# Patient Record
Sex: Male | Born: 1955 | Race: White | Hispanic: No | Marital: Married | Smoking: Former smoker
Health system: Southern US, Community
[De-identification: ages and names within clinical notes are randomized; demographics above are authoritative.]

## PROBLEM LIST (undated history)

## (undated) DIAGNOSIS — R42 Dizziness and giddiness: Secondary | ICD-10-CM

## (undated) DIAGNOSIS — J45909 Unspecified asthma, uncomplicated: Secondary | ICD-10-CM

## (undated) DIAGNOSIS — E785 Hyperlipidemia, unspecified: Secondary | ICD-10-CM

## (undated) DIAGNOSIS — I10 Essential (primary) hypertension: Secondary | ICD-10-CM

## (undated) DIAGNOSIS — E7849 Other hyperlipidemia: Secondary | ICD-10-CM

## (undated) DIAGNOSIS — Z973 Presence of spectacles and contact lenses: Secondary | ICD-10-CM

## (undated) DIAGNOSIS — M1990 Unspecified osteoarthritis, unspecified site: Secondary | ICD-10-CM

## (undated) DIAGNOSIS — K219 Gastro-esophageal reflux disease without esophagitis: Secondary | ICD-10-CM

## (undated) DIAGNOSIS — E663 Overweight: Secondary | ICD-10-CM

## (undated) DIAGNOSIS — Z905 Acquired absence of kidney: Secondary | ICD-10-CM

## (undated) HISTORY — DX: Other hyperlipidemia: E78.49

## (undated) HISTORY — PX: OTHER SURGICAL HISTORY: SHX169

## (undated) HISTORY — PX: KIDNEY DONATION: SHX685

## (undated) HISTORY — PX: ELBOW SURGERY: SHX618

## (undated) HISTORY — DX: Acquired absence of kidney: Z90.5

## (undated) HISTORY — DX: Gastro-esophageal reflux disease without esophagitis: K21.9

## (undated) HISTORY — DX: Overweight: E66.3

## (undated) HISTORY — DX: Essential (primary) hypertension: I10

## (undated) HISTORY — PX: NEPHRECTOMY: SHX65

## (undated) HISTORY — PX: HIP ARTHROPLASTY: SHX981

## (undated) HISTORY — DX: Dizziness and giddiness: R42

## (undated) HISTORY — PX: CYST EXCISION: SHX5701

## (undated) HISTORY — DX: Hyperlipidemia, unspecified: E78.5

---

## 2002-04-03 ENCOUNTER — Encounter: Payer: Self-pay | Admitting: Cardiology

## 2002-04-03 ENCOUNTER — Observation Stay (HOSPITAL_COMMUNITY): Admission: EM | Admit: 2002-04-03 | Discharge: 2002-04-04 | Payer: Self-pay | Admitting: Cardiology

## 2002-04-04 ENCOUNTER — Encounter: Payer: Self-pay | Admitting: Cardiology

## 2002-04-04 HISTORY — PX: CARDIAC CATHETERIZATION: SHX172

## 2005-01-06 ENCOUNTER — Encounter (INDEPENDENT_AMBULATORY_CARE_PROVIDER_SITE_OTHER): Payer: Self-pay | Admitting: Specialist

## 2005-01-06 ENCOUNTER — Ambulatory Visit (HOSPITAL_COMMUNITY): Admission: RE | Admit: 2005-01-06 | Discharge: 2005-01-06 | Payer: Self-pay | Admitting: Neurosurgery

## 2006-05-31 ENCOUNTER — Ambulatory Visit: Payer: Self-pay | Admitting: Cardiology

## 2006-10-26 ENCOUNTER — Ambulatory Visit: Payer: Self-pay | Admitting: Gastroenterology

## 2007-04-07 ENCOUNTER — Ambulatory Visit (HOSPITAL_COMMUNITY): Admission: RE | Admit: 2007-04-07 | Discharge: 2007-04-07 | Payer: Self-pay | Admitting: Family Medicine

## 2008-01-18 ENCOUNTER — Ambulatory Visit: Payer: Self-pay | Admitting: Cardiology

## 2008-02-01 ENCOUNTER — Ambulatory Visit: Payer: Self-pay | Admitting: Cardiology

## 2008-02-01 ENCOUNTER — Ambulatory Visit: Payer: Self-pay

## 2009-08-23 ENCOUNTER — Encounter: Payer: Self-pay | Admitting: Cardiology

## 2009-08-25 ENCOUNTER — Ambulatory Visit: Payer: Self-pay | Admitting: Cardiology

## 2009-08-25 ENCOUNTER — Encounter: Payer: Self-pay | Admitting: Cardiology

## 2009-09-01 ENCOUNTER — Ambulatory Visit: Payer: Self-pay | Admitting: Cardiology

## 2009-09-08 ENCOUNTER — Encounter: Payer: Self-pay | Admitting: Cardiology

## 2009-09-08 DIAGNOSIS — E785 Hyperlipidemia, unspecified: Secondary | ICD-10-CM | POA: Insufficient documentation

## 2009-09-08 DIAGNOSIS — E663 Overweight: Secondary | ICD-10-CM | POA: Insufficient documentation

## 2009-09-08 DIAGNOSIS — I10 Essential (primary) hypertension: Secondary | ICD-10-CM | POA: Insufficient documentation

## 2009-09-08 DIAGNOSIS — E669 Obesity, unspecified: Secondary | ICD-10-CM | POA: Insufficient documentation

## 2009-09-08 DIAGNOSIS — R42 Dizziness and giddiness: Secondary | ICD-10-CM | POA: Insufficient documentation

## 2009-09-10 ENCOUNTER — Ambulatory Visit: Payer: Self-pay | Admitting: Cardiology

## 2009-09-24 ENCOUNTER — Encounter: Payer: Self-pay | Admitting: Cardiology

## 2009-09-26 ENCOUNTER — Encounter (INDEPENDENT_AMBULATORY_CARE_PROVIDER_SITE_OTHER): Payer: Self-pay | Admitting: *Deleted

## 2009-10-08 ENCOUNTER — Ambulatory Visit: Admission: RE | Admit: 2009-10-08 | Discharge: 2009-10-08 | Payer: Self-pay | Admitting: Family Medicine

## 2010-04-12 ENCOUNTER — Encounter: Payer: Self-pay | Admitting: Family Medicine

## 2010-04-21 NOTE — Miscellaneous (Signed)
  Clinical Lists Changes  Problems: Added new problem of HYPERTENSION (ICD-401.9) Added new problem of DYSLIPIDEMIA (ICD-272.4) Added new problem of OVERWEIGHT (ICD-278.02) Added new problem of * NEPHRECTOMY Added new problem of DIZZINESS (ICD-780.4) Added new problem of * Rule out  R/O OSA Observations: Added new observation of PAST MED HX: Normal coronaries....cath...2004  /   nuclear...normal...2008 Hypertension Dyslipidemia GERD Overweight 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 (09/08/2009 17:21)       Past History:  Past Medical History: Normal coronaries....cath...2004  /   nuclear...normal...2008 Hypertension Dyslipidemia GERD Overweight 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

## 2010-04-21 NOTE — Letter (Signed)
Summary: Engineer, materials at [facility]  [address]   [place], [place] [address]   Phone: [phone]  Fax: [phone]        September 26, 2009 MRN: [id number]    Blake Solis [address] [place], [place]  [address]    Dear [name],  Your test ordered by Selena Batten has been reviewed by your physician (or physician assistant) and was found to be normal or stable. Your physician (or physician assistant) felt no changes were needed at this time.  ____ Echocardiogram  ____ Cardiac Stress Test  ____ Lab Work  ____ Peripheral vascular study of arms, legs or neck  ____ CT scan or X-ray  ____ Lung or Breathing test  __X__ Other: Heart Monitor   Thank you.   Cyril Loosen, RN, BSN    Duane Boston, M.D., F.A.C.C. Thressa Sheller, M.D., F.A.C.C. Oneal Grout, M.D., F.A.C.C. Cheree Ditto, M.D., F.A.C.C. Daiva Nakayama, M.D., F.A.C.C. Kenney Houseman, M.D., F.A.C.C. Jeanne Ivan, PA-C

## 2010-04-21 NOTE — Consult Note (Signed)
Summary: Consultation Report/ CARDIOLOGY  Consultation Report/ CARDIOLOGY   Imported By: Dorise Hiss 08/29/2009 12:25:38  _____________________________________________________________________  External Attachment:    Type:   Image     Comment:   External Document

## 2010-04-21 NOTE — Assessment & Plan Note (Signed)
Summary: EPH- POST [facility] 6/10   Visit Type:  hospital follow-up Primary Provider:  Elon Alas  CC:  dizziness.  History of Present Illness: The patient is seen post hospitalization.  He admitted with marked dizziness.  He did not have syncope or presyncope.  He was followed in the hospital and there was no evidence of myocardial infarction.  Two-dimensional echo revealed normal left ventricular function.  It was felt that he might have sleep apnea.  It was also felt that he might have symptomatic bradycardia.  He was discharged and he has been wearing an event recorder.  Unfortunately the recorder leads, off at work and he pulls them off by mistake at night.  We do have some useful information.  He's not had any documented marked bradycardia.  He had one episode of mild lightheadedness and he had sinus rhythm at that time.  He is fully active.  Preventive Screening-Counseling & Management  Alcohol-Tobacco     Smoking Status: quit     Year Quit: 2000  Current Medications (verified): 1)  Lisinopril-Hydrochlorothiazide 20-25 Mg Tabs (Lisinopril-Hydrochlorothiazide) .... Take 1/2 Tablet By Mouth Once A Day 2)  Aspir-Low 81 Mg Tbec (Aspirin) .... Take 1 Tablet By Mouth Once A Day  Allergies (verified): No Known Drug Allergies  Comments:  Nurse/Medical Assistant: The patient's medications and allergies were verbally reviewed with the patient and were updated in the Medication and Allergy Lists.  Past History:  Past Medical History: Normal coronaries....cath...2004  /   nuclear...normal...2008 Hypertension Dyslipidemia GERD Overweight Nephrectomy  left....donated to his brother who lives without dialysis for 6 more years and died of diabetes complications EF  55-60%...echo...08/25/2009 Dizziness  MMH..08/23/2009.....consider chronotropic incompetence / outpatient event recorder OSA   needs to be ruled out 08/2009  Social History: Smoking Status:  quit  Review of Systems    Patient denies fever, chills, headache, sweats, rash, change in vision, change in hearing, chest pain, cough, nausea vomiting, urinary symptoms.  All of the systems are reviewed and are negative.  Vital Signs:  Patient profile:   55 year old male Height:      70 inches Weight:      248 pounds BMI:     35.71 Pulse rate:   54 / minute BP sitting:   102 / 70  (left arm) Cuff size:   large  Vitals Entered By: Carlye Grippe (September 10, 2009 1:36 PM)  Nutrition Counseling: Patient's BMI is greater than 25 and therefore counseled on weight management options.  Physical Exam  General:  Patient looks good but is overweight. Head:  head is atraumatic. Eyes:  no xanthelasma. Neck:  no jugular venous stenting.  No carotid bruits. Chest Wall:  no chest wall tenderness. Lungs:  lungs are clear.  Respiratory effort is nonlabored. Heart:  cardiac exam shows S1 and S2.  No clicks or significant murmurs. Abdomen:  abdomen is soft. Msk:  no musculoskeletal deformities. Extremities:  no peripheral edema. Skin:  no skin rashes. Psych:  patient is oriented to person time and place.  Affect is normal.   Impression & Recommendations:  Problem # 1:  Rule out OSA Copy of this note will go to the patient's primary physician.  He will follow up with her about studies needed to rule out obstructive sleep apnea.  I think it would be a good idea to do this.  Problem # 2:  DIZZINESS (ICD-780.4) The patient's dizziness is significantly improved.  He has stopped taking her medication for arthritis  and had been started prior to hospitalization.  He is remaining hydrated.  I have reviewed the strips are available so far from his event recorder.  He's had no significant bradycardia.  At some point I may consider exercise testing to be sure that he does not have chronotropic incompetence.  At this point he is stable and no further workup until the next visit.  Problem # 3:  HYPERTENSION (ICD-401.9)  His updated  medication list for this problem includes:    Lisinopril-hydrochlorothiazide 20-25 Mg Tabs (Lisinopril-hydrochlorothiazide) .Marland Kitchen... Take 1/2 tablet by mouth once a day    Aspir-low 81 Mg Tbec (Aspirin) .Marland Kitchen... Take 1 tablet by mouth once a day Blood pressures controlled today.  No change in therapy  Problem # 4:  OVERWEIGHT (ICD-278.02) I have encouraged him to lose weight.  Patient Instructions: 1)  Your physician wants you to follow-up in: 3 months. You will receive a reminder letter in the mail one-two months in advance. If you don't receive a letter, please call our office to schedule the follow-up appointment. 2)  Your physician recommends that you continue on your current medications as directed. Please refer to the Current Medication list given to you today. 3)  Continue wearing cardiac monitor.

## 2010-04-21 NOTE — Procedures (Signed)
Summary: Holter and Event/ CARDIONET  Holter and Event/ CARDIONET   Imported By: Dorise Hiss 09/25/2009 14:22:25  _____________________________________________________________________  External Attachment:    Type:   Image     Comment:   External Document  Appended Document: Holter and Event/ CARDIONET Pt notified of results by letter.

## 2010-04-21 NOTE — Cardiovascular Report (Signed)
Summary: Cardiac Catheterization  Cardiac Catheterization   Imported By: Dorise Hiss 09/10/2009 09:18:23  _____________________________________________________________________  External Attachment:    Type:   Image     Comment:   External Document

## 2010-06-19 ENCOUNTER — Encounter: Payer: Self-pay | Admitting: Cardiology

## 2010-06-20 ENCOUNTER — Encounter: Payer: Self-pay | Admitting: Cardiology

## 2010-07-03 LAB — HM COLONOSCOPY

## 2010-07-08 ENCOUNTER — Encounter: Payer: Self-pay | Admitting: Nurse Practitioner

## 2010-07-08 DIAGNOSIS — N433 Hydrocele, unspecified: Secondary | ICD-10-CM | POA: Insufficient documentation

## 2010-07-08 DIAGNOSIS — N419 Inflammatory disease of prostate, unspecified: Secondary | ICD-10-CM | POA: Insufficient documentation

## 2010-07-08 DIAGNOSIS — IMO0002 Reserved for concepts with insufficient information to code with codable children: Secondary | ICD-10-CM | POA: Insufficient documentation

## 2010-07-08 DIAGNOSIS — I10 Essential (primary) hypertension: Secondary | ICD-10-CM | POA: Insufficient documentation

## 2010-07-08 DIAGNOSIS — E7849 Other hyperlipidemia: Secondary | ICD-10-CM | POA: Insufficient documentation

## 2010-07-08 DIAGNOSIS — E785 Hyperlipidemia, unspecified: Secondary | ICD-10-CM

## 2010-07-30 ENCOUNTER — Ambulatory Visit: Payer: Self-pay | Admitting: Cardiology

## 2010-08-04 NOTE — Assessment & Plan Note (Signed)
[facility]                          [place] [facility] OFF[facility] NOTE   NAME:Blake Solis, Blake Solis                     MRN:          [id number]  DATE:01/18/2008                            DOB:          1956-03-15    Blake Solis has had chest pain and shortness of breath.  Dr. Raquel James  had called me and we were arranging a visit with Blake Solis.  He  changed the timing and is now here for that evaluation.  He is  overweight.  He has been relatively active.  He does have a family  history of coronary disease.  Recently, he noticed exertional shortness  of breath.  He also has his right upper chest discomfort with exertion.  He has had this over time.  Most recently, it appears to be worse.  He  has not had any rest pain.  The pain occurs in his right upper chest.  There is no significant radiation.  There is shortness of breath.  There  is no nausea, vomiting, or diaphoresis.  The patient does have a  significant family history of coronary disease with brothers having had  MIs.  I have reviewed his data extensively including finding his cath  report from 2004.  At that time, he had high ejection fraction, and his  coronaries were normal.  Also, we have found through the [facility] records that the patient had an adenosine Cardiolite scan done  in March 2008.  That study showed a normal ejection fraction and no  ischemia.   ALLERGIES:  No known drug allergies.   MEDICATIONS:  He is on no medications.   OTHER MEDICAL PROBLEMS:  See the list below.   SOCIAL HISTORY:  The patient had work at [employer], but was laid  off in May.  He is actively looking for work.  He is married.  He quit  smoking approximately 6 years ago.   FAMILY HISTORY:  There is a definite family history of coronary disease.   REVIEW OF SYSTEMS:  He is not having any fevers or chills.  He is not  having any skin problems.  He has no headaches.  He has mild GERD by  history.   This is not an active problem at this time.  He does have a  problem with some constipation.  Otherwise, review of systems is  negative.   PHYSICAL EXAMINATION:  Blood pressure when taken initially was 190/102.  I repeated his blood pressure in his right arm with a large cuff getting  160/98.  His weight is 278 pounds.  Pulse is 68.  The patient is  oriented to person, time, and place.  Affect is normal.  HEENT reveals  no xanthelasma.  He has normal extraocular motion.  There are no carotid  bruits.  There is no jugular venous distention.  Lungs are clear.  Respiratory effort is not labored.  Cardiac exam reveals an S1 with an  S2.  There are no clicks or significant murmurs.  His abdomen is obese,  but soft.  He has no significant peripheral edema.  He  has 2 dark spots  on his lower right arm.  This was an attempt by him many years ago to do  his own tattoo.   EKG was sent to [place] dated November 16, 2007.  It revealed nonspecific ST-T  wave changes.   Problems include:  1. 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  This was done in 2004.  The kidney was accepted and his      brother-in-law is off dialysis and doing well.  2. 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  3. History of gastroesophageal reflux disease that is mild and not an      active problem.  4. History of some leg weakness in the past that was thought to be      related to disk disease.  5. Chest x-ray in August 2009 revealing some mild increased bronchial      mark.  6. Obesity.  7. History of smoking that he quit approximately 6 years ago.  8. Definite family history of coronary artery disease.  9. Possible hyperlipidemia that needs to be followed up.  10.Elevated blood pressure today.  He has not had elevated blood      pressure in the past.  I am not starting meds today, but this will      be followed carefully.  11.EKG with nonspecific ST-T wave changes.   12.Exertional shortness of breath and chest pain.  His current      symptoms are concerning.  However, we know that he had a very      normal catheterization in 2004 and he had a normal adenosine      Cardiolite in March 2008.  Therefore, I feel that we can reassess      him with a Cardiolite at this time.  At one time in the past also,      he had a high ejection fraction.  I do not hear a murmur today, but      we will obtain a 2-D echo to be sure that he does not have a      hypertrophic myopathy.  We will also assess his diastolic function.      I will not be starting meds other than suggesting that he take an      aspirin.   The patient actually lives [place].  We will arrange for his test to be  done at [facility], and I will see him in the [place] office for  followup.     Blake Abed, MD, [facility]  Electronically Signed    JDK/MedQ  DD: 01/18/2008  DT: 01/19/2008  Job #: [id number]   cc:   Blake Beath, MD

## 2010-08-04 NOTE — Assessment & Plan Note (Signed)
[facility]                            CARDIOLOGY OFFICE NOTE   NAME:Blake Solis, Blake Solis                     MRN:          [id number]  DATE:02/01/2008                            DOB:          1955/05/02    I saw Mr. Ricciuti on January 18, 2008.  At that time, we considered a  nuclear exercise test and possibly a follow up echo.  The patient has no  insurance and he was very hesitant to undergo testing that would have  any significant cost.  I re-reviewed the entire situation and decided  that we could proceed with a standard treadmill and then make decisions  going further.   The patient had treadmill this morning.  See the report.  He exercised  only 3-1/2 minutes.  He had shortness of breath.  There might have been  slight chest pain.  There was no ectopy.  There was no significant EKG  change.  Study was nondiagnostic because of inadequate heart rate.   I have discussed the findings with the patient.  It is of note that he  is now on lisinopril/hydrochlorothiazide.  He took it this morning.  His  resting blood pressure was relatively good, but he had a fairly rapid  increase in blood pressure with exercise.   Problems are listed on my note of January 18, 2008.  #10.  Elevated blood pressure.  This is now being treated by Dr.  Raquel James.  I have encouraged the patient to be in touch and to plan to  see Dr. Raquel James for followup sometime next week.  #12.  Exertional shortness of breath and chest pain.  The patient has a  hypertensive response to stress.  He has limited exercise tolerance.  He  has no significant EKG change with the symptoms.  At this point, I am  not convinced that this represents ischemia.  Further evaluation could  include a cardiopulmonary exercise test.  This would give [place] a great  deal of information concerning his limitations.  However, at this point  considering all issues, we will follow him clinically.  He will see Dr.  Raquel James for followup for continued management of his blood pressure.  I  will plan to see him in 3 months, see in follow up to reassess all of  his issues.     Luis Abed, MD, [facility]  Electronically Signed    JDK/MedQ  DD: 02/01/2008  DT: 02/01/2008  Job #: [id number]   cc:   Ursula Beath, MD

## 2010-08-04 NOTE — Procedures (Signed)
[facility]                              EXERCISE TREADMILL   NAME:Lafferty, Blake Solis                     MRN:          [id number]  DATE:02/01/2008                            DOB:          02-15-1956    [name] has had chest pain and shortness of breath and this study is  done for further evaluation.   Resting blood pressure was 148/79.  The patient began to walk, and he  completed 30 seconds into stage II of the Standard Bruce.  Heart rate  increased to 117, representing 69% predicted maximum heart rate.  He  stopped due to overall fatigue and shortness of breath.  He had slight  chest discomfort.  Blood pressure went up rapidly to 206/89.   EKG revealed no significant abnormalities.  There was no ectopy.  There  was no significant ST change.   IMPRESSION:  1. Nondiagnostic exercise tolerance test due to inadequate heart rate.      No definite ischemia is seen.  2. Limited exercise tolerance.  3. Question of chronotropic incompetence, but he really did have an      increase in heart rate from 75 up to 117.  I doubt that this is the      primary issue.     Luis Abed, MD, [facility]  Electronically Signed    JDK/MedQ  DD: 02/01/2008  DT: 02/01/2008  Job #: [id number]

## 2010-08-07 NOTE — Cardiovascular Report (Signed)
   Blake Solis, Blake Solis NO.:  [id number]   MEDICAL RECORD NO.:  [id number]                   PATIENT TYPE:  INP   LOCATION:  3737                                 FACILITY:  [facility]   PHYSICIAN:  Salvadore Farber, M.D. [facility]         DATE OF BIRTH:  May 17, 1955   DATE OF PROCEDURE:  04/04/2002  DATE OF DISCHARGE:  04/04/2002                              CARDIAC CATHETERIZATION   PROCEDURES:  Left heart catheterization, left ventriculography, coronary  angiography.   INDICATIONS:  The patient is a 55 year old gentleman with no known history  of coronary artery disease, but risk factors of obesity, remote tobacco use,  family history of premature coronary artery disease.  He now presents with  recurrent chest discomfort.  Given his risk factors he is referred directly  for diagnostic angiography.   DIAGNOSTIC TECHNIQUE:  Informed consent was obtained. Under 1% lidocaine  local anesthesia, a 6 French sheath was placed in the right femoral artery  using the modified Seldinger technique. Diagnostic angiography and  ventriculography were performed using JL4, JR4, and pigtail catheters.  The  patient tolerated the procedure well and was transferred to the holding room  in stable condition.   COMPLICATIONS:  None.   FINDINGS:  1. LV:  131/10/23, EF 80% without regional wall motion abnormality.  2. Left main:  Angiographically normal.  3. LAD:  The LAD is a large vessel giving rise to two diagonal branches.  It     is angiographically normal.  4. Circumflex:  The circumflex is a moderate sized vessel giving rise to two     obtuse marginal branches.  It is angiographically normal.  5. RCA:  The RCA is a moderate sized but dominant vessel which is     angiographically normal.    IMPRESSION:  1. Normal left ventricular size and systolic function.  2. No aortic stenosis or mitral regurgitation.  3. Angiographically normal coronary  arteries.                                                      Salvadore Farber, M.D. [facility]    WED/MEDQ  D:  05/31/2002  T:  05/31/2002  Job:  [id number]   cc:   Fara Chute  [address] [place]  [place] [address]  Fax: [phone]

## 2010-08-07 NOTE — Op Note (Signed)
Blake Solis, Blake Solis              ACCOUNT NO.:  [id number]   MEDICAL RECORD NO.:  [id number]          PATIENT TYPE:  AMB   LOCATION:  SDS                          FACILITY:  [facility]   PHYSICIAN:  Coletta Memos, M.D.     DATE OF BIRTH:  03-Feb-1956   DATE OF PROCEDURE:  01/06/2005  DATE OF DISCHARGE:                                 OPERATIVE REPORT   PREOPERATIVE DIAGNOSIS:  Left ulnar nerve neuropathy.   POSTOPERATIVE DIAGNOSIS:  Left ulnar nerve neuropathy.   OPERATION PERFORMED:  Left ulnar nerve decompression via removal of ganglion  cyst at the medial epicondyle and olecranon.   SURGEON:  Coletta Memos, M.D.   ANESTHESIA:  General.   COMPLICATIONS:  None.   SPECIMENS:  Cyst wall.   FINDINGS:  Clear, golden, mildly thick fluid emanated from cyst.   INDICATIONS FOR PROCEDURE:  Blake Solis is a gentleman who presented with  pain in his left upper extremity from his elbow down.  He had a positive  Tinel's sign.  He also had weakness in his grip, intrinsics and abductor  digiti minimi.  It was my belief that he had an ulnar neuropathy and on EMG,  this was shown.  I therefore recommended and he agreed to undergo operative  decompression.   DESCRIPTION OF PROCEDURE:  His left upper extremity was prepped from his  fingers to the elbow.  He had a stockinette placed and an extremity drape.  I opened the extremity drape and then infiltrated 10 mL 0.5% lidocaine with  epinephrine 1:200,000 in a semicircle incision with its apex based over the  medial epicondyle.  I then dissected down through the soft tissue until I  was able to find the olecranon and the medial epicondyle.  At that point I  dissected but was not able to find the nerve.  There was a large muscle  which actually was right in the groove between the medial epicondyle in the  ulnar groove.  I dissected just between that and some soft tissue and then  encountered what was a large translucent bubble.  Not sure what it  was, I  dissected further and it was clear at this point that this was a large  cystic structure which was causing significant compression of the nerve root  against the medial epicondyle.  I used the nerve stimulator and used two but  was never able to stimulate the ulnar nerve during the case.  Nevertheless I  was able to dissect the ulnar nerve out both proximally and distally and  there was no pressure there and it was clear quite frankly that the pressure  had been coming from the cyst.  I then broke the cyst open.  Clear, golden,  mildly thick fluid then drained out of it and I further dissected the cyst  wall until I was able to  remove it very close to the joint itself.  The nerve was free of any  pressure at that point. I then irrigated the wound. I tried to stimulate  once more and again was unable to cause any movement in  the ulnar enervated  musculature.  I then closed the wound in layered fashion using Vicryl  sutures.  Dermabond was used for sterile dressing.           ______________________________  Coletta Memos, M.D.     KC/MEDQ  D:  01/06/2005  T:  01/07/2005  Job:  [id number]

## 2010-08-07 NOTE — Discharge Summary (Signed)
Blake Solis, Blake Solis                          ACCOUNT NO.:  [id number]   MEDICAL RECORD NO.:  [id number]                   PATIENT TYPE:  INP   LOCATION:  3737                                 FACILITY:  [facility]   PHYSICIAN:  Learta Codding, M.D. [facility]             DATE OF BIRTH:  12-06-1955   DATE OF ADMISSION:  04/03/2002  DATE OF DISCHARGE:  04/04/2002                           DISCHARGE SUMMARY - REFERRING   PROCEDURES:  1. Cardiac catheterization.  2. Coronary arteriogram.  3. Left ventriculogram.   HISTORY OF PRESENT ILLNESS:  The patient is a 55 year old male with no known  history of coronary artery disease.  He had chest discomfort in 2002 for  which he underwent exercise Cardiolite which showed no abnormalities with a  normal EF.  He had chest pain for which he was evaluated in [place] and seen by  cardiology there.  It was felt that, with his risk factors including  obesity, remote history of tobacco use, family history of coronary artery  disease, he needed further evaluation including cardiac catheterization.  He  was transferred to [facility] and scheduled for this on 04/04/2002.   HOSPITAL COURSE:  The patient had a cardiac catheterization on 04/04/2002  which showed normal coronary arteries and an EF of 80% with no regional wall  motion abnormalities.  It was felt that his symptoms were not secondary to  coronary artery disease, and further outpatient workup was indicated.  His  discharge is pending ambulation after bed restis completed.  He also had  borderline hypertension and was started on low-dose beta blocker for this.  He is also advised to keep taking a baby aspirin daily.  He had a remote  history of gastroesophageal reflux disease but has not had symptoms in  several months and is to follow up with his primary care physician for this  and for hyperlipidemia.   If his groin is stable with ambulation, he will be discharged on 04/04/2002  p.m.   LABORATORY  DATA:  Hemoglobin 15.3, hematocrit 44.4, WBC 7.1, platelets 189.  Sodium 137, potassium 3.8, chloride 104, CO2 26, BUN 10, creatinine 0.9,  glucose 111.   Chest x-ray: Cardiomegaly with no active lung disease.   CONDITION ON DISCHARGE:  Stable.   DISCHARGE DIAGNOSES:  1. Chest pain, no cardiac source by catheterization.  2. Remote history of tobacco use.  3. Family history of premature coronary artery disease.  4. Hyperlipidemia with an LDL of 130 at last check.  5. Mild obesity.   DISCHARGE INSTRUCTIONS:  1. Activity level is to include no driving, sexual or strenuous activity for     two days.  He can return to work Monday.  2. He is to stick to a low-fat diet.  3. He is to call the office for problems with the catheterization site.  4. He is to follow up with Dr. Neita Carp within two weeks.  5. He is to follow up with Dr. Andee Lineman on a p.r.n. basis.   DISCHARGE MEDICATIONS:  1. Toprol XL 25 mg daily.  2. Aspirin 81 mg daily.     Lavella Hammock, P.A. [facility]                  Learta Codding, M.D. [facility]    RG/MEDQ  D:  04/04/2002  T:  04/04/2002  Job:  [id number]   cc:   Fara Chute, M.D.   [facility] [place], [place]

## 2010-08-13 ENCOUNTER — Ambulatory Visit: Payer: Self-pay | Admitting: Cardiology

## 2011-03-23 HISTORY — PX: KNEE ARTHROSCOPY: SUR90

## 2011-10-07 ENCOUNTER — Encounter: Payer: Self-pay | Admitting: *Deleted

## 2012-05-29 ENCOUNTER — Other Ambulatory Visit (HOSPITAL_COMMUNITY): Payer: Self-pay | Admitting: Orthopaedic Surgery

## 2012-05-30 ENCOUNTER — Encounter (HOSPITAL_COMMUNITY): Payer: Self-pay | Admitting: Pharmacy Technician

## 2012-06-02 ENCOUNTER — Encounter (HOSPITAL_COMMUNITY)
Admission: RE | Admit: 2012-06-02 | Discharge: 2012-06-02 | Disposition: A | Discharge status: Home / Self Care | Payer: BC Managed Care – PPO | Source: Ambulatory Visit | Attending: Orthopaedic Surgery | Admitting: Orthopaedic Surgery

## 2012-06-02 ENCOUNTER — Encounter (HOSPITAL_COMMUNITY): Payer: Self-pay

## 2012-06-02 DIAGNOSIS — Z01818 Encounter for other preprocedural examination: Secondary | ICD-10-CM | POA: Insufficient documentation

## 2012-06-02 DIAGNOSIS — Z01812 Encounter for preprocedural laboratory examination: Secondary | ICD-10-CM | POA: Insufficient documentation

## 2012-06-02 DIAGNOSIS — M1610 Unilateral primary osteoarthritis, unspecified hip: Secondary | ICD-10-CM | POA: Insufficient documentation

## 2012-06-02 HISTORY — DX: Unspecified asthma, uncomplicated: J45.909

## 2012-06-02 HISTORY — DX: Unspecified osteoarthritis, unspecified site: M19.90

## 2012-06-02 LAB — COMPREHENSIVE METABOLIC PANEL
Albumin: 4 g/dL (ref 3.5–5.2)
BUN: 12 mg/dL (ref 6–23)
Calcium: 9.5 mg/dL (ref 8.4–10.5)
Creatinine, Ser: 1.04 mg/dL (ref 0.50–1.35)
GFR calc Af Amer: >90 mL/min (ref >90)
Glucose, Bld: 98 mg/dL (ref 70–99)
Potassium: 3.9 mEq/L (ref 3.5–5.1)
Total Protein: 7.4 g/dL (ref 6.0–8.3)

## 2012-06-02 LAB — CBC
HCT: 41 % (ref 39.0–52.0)
Hemoglobin: 14.5 g/dL (ref 13.0–17.0)
MCH: 31.2 pg (ref 26.0–34.0)
MCHC: 35.4 g/dL (ref 30.0–36.0)
MCV: 88.2 fL (ref 78.0–100.0)
RDW: 12.7 % (ref 11.5–15.5)

## 2012-06-02 LAB — APTT: aPTT: 37 seconds (ref 24–37)

## 2012-06-02 LAB — TYPE AND SCREEN
ABO/RH(D): O POS
Antibody Screen: NEGATIVE

## 2012-06-02 LAB — PROTIME-INR
INR: 1.01 (ref 0.00–1.49)
Prothrombin Time: 13.2 seconds (ref 11.6–15.2)

## 2012-06-02 LAB — URINALYSIS, ROUTINE W REFLEX MICROSCOPIC
Hgb urine dipstick: NEGATIVE
Nitrite: NEGATIVE
Protein, ur: NEGATIVE mg/dL
Specific Gravity, Urine: 1.015 (ref 1.005–1.030)
Urobilinogen, UA: 1 mg/dL (ref 0.0–1.0)

## 2012-06-02 NOTE — Pre-Procedure Instructions (Signed)
GAHEL SAFLEY  06/02/2012   Your procedure is scheduled on:  Wednesday June 07, 2012  Report to [facility] at 1045 AM.  Call this number if you have problems the morning of surgery: [phone]   Remember:   Do not eat food or drink liquids after midnight.   Take these medicines the morning of surgery with A SIP OF WATER: Norvasc(Amlodipine), Hydrocodone-Acetaminophen(Vicodin) or Tramadol if needed for pain   Do not wear jewelry.  Do not wear lotions, or powders. You may wear deodorant.  Men may shave face and neck.  Do not bring valuables to the hospital.  Contacts, dentures or bridgework may not be worn into surgery.  Leave suitcase in the car. After surgery it may be brought to your room.  For patients admitted to the hospital, checkout time is 11:00 AM the day of  discharge.   Patients discharged the day of surgery will not be allowed to drive  home.    Special Instructions: Incentive Spirometry - Practice and bring it with you on the day of surgery. Shower using CHG 2 nights before surgery and the night before surgery.  If you shower the day of surgery use CHG.  Use special wash - you have one bottle of CHG for all showers.  You should use approximately 1/3 of the bottle for each shower.   Please read over the following fact sheets that you were given: Pain Booklet, Coughing and Deep Breathing, Blood Transfusion Information, Total Joint Packet, MRSA Information and Surgical Site Infection Prevention

## 2012-06-02 NOTE — Progress Notes (Signed)
Dr. Ophelia Charter office made aware that patient missed his PAT appointment.

## 2012-06-05 NOTE — H&P (Signed)
TOTAL HIP ADMISSION H&P  Patient is admitted for right total hip arthroplasty.  Subjective:  Chief Complaint: right hip pain  HPI: Blake Solis, 57 y.o. male, has a history of pain and functional disability in the right hip(s) due to arthritis and patient has failed non-surgical conservative treatments for greater than 12 weeks to include NSAID's and/or analgesics, use of assistive devices and activity modification.  Onset of symptoms was gradual starting 1 years ago with gradually worsening course since that time.The patient noted no past surgery on the right hip(s).  Patient currently rates pain in the right hip at 8 out of 10 with activity. Patient has night pain, worsening of pain with activity and weight bearing, trendelenberg gait and pain that interfers with activities of daily living. Patient has evidence of subchondral sclerosis, joint space narrowing and flattening of the femoral head and erosion of the acetabulum by imaging studies. This condition presents safety issues increasing the risk of falls. 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  There is no current active infection.  Patient Active Problem List   Diagnosis Date Noted  . DYSLIPIDEMIA 09/08/2009  . OVERWEIGHT 09/08/2009  . HYPERTENSION 09/08/2009  . DIZZINESS 09/08/2009   Past Medical History  Diagnosis Date  . HTN (hypertension)   . Dyslipidemia   . GERD (gastroesophageal reflux disease)   . Overweight   . Dizziness   . OSA (obstructive sleep apnea)   . Bronchitis, allergic   . Arthritis     Past Surgical History  Procedure Laterality Date  . 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  . Cardiac catheterization    . Lft ulnar nerve removed      decompression  . Knee arthroscopy  2013  . Cyst excision Right     cyst removed from arm    No prescriptions prior to admission   No Known Allergies  History   Substance Use Topics  . Smoking status: Former Smoker -- 1.00 packs/day for 35 years    Types: Cigarettes    Quit date: 06/02/2005  . Smokeless tobacco: Never Used  . Alcohol Use: No    No family history on file.   Review of Systems  Musculoskeletal: Positive for joint pain.       Right hip pain with weight bearing.    Objective:  Physical Exam  Constitutional: He is oriented to person, place, and time. He appears well-developed and well-nourished.  HENT:  Head: Normocephalic and atraumatic.  Eyes: EOM are normal. Pupils are equal, round, and reactive to light.  Neck: Normal range of motion. Neck supple.  Cardiovascular: Normal rate, regular rhythm and intact distal pulses.   Respiratory: Effort normal.  GI: Soft.  Musculoskeletal:  Right hip with 10 degrees of internal rotation and 20 degrees of external rotation.  No contracture and no effusion of hip. Antalgic gait.   Neurological: He is alert and oriented to person, place, and time.  Skin: Skin is warm and dry.  Psychiatric: He has a normal mood and affect.    Vital signs in last 24 hours:    Labs:   Estimated body mass index is 35.58 kg/(m^2) as calculated from the following:   Height as of 09/10/09: 5\' 10"  (1.778 m).   Weight as of 09/10/09: 112.492 kg (248 lb).   Imaging Review Plain radiographs demonstrate severe degenerative joint disease of the right hip(s). The bone  quality appears to be adequate for age and reported activity level.  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 The treatment options including medical management, injection therapy, arthroscopy and arthroplasty were discussed at length. The risks and benefits of total hip  arthroplasty were presented and reviewed. The risks due to aseptic loosening, infection, stiffness, dislocation/subluxation,  thromboembolic complications and other imponderables were discussed.  The patient acknowledged the explanation, agreed to proceed with the plan and consent was signed. Patient is being admitted for inpatient treatment for surgery, pain control, PT, OT, prophylactic antibiotics, VTE prophylaxis, progressive ambulation and ADL's and discharge planning.The patient is planning to be discharged home with home health services

## 2012-06-06 MED ORDER — CEFAZOLIN SODIUM-DEXTROSE 2-3 GM-% IV SOLR
2.0000 g | INTRAVENOUS | Status: AC
  Administered 2012-06-07: 2 g via INTRAVENOUS
  Filled 2012-06-06: qty 50

## 2012-06-07 ENCOUNTER — Encounter (HOSPITAL_COMMUNITY)
Admission: RE | Disposition: A | Discharge status: Home / Self Care | Payer: Self-pay | Source: Ambulatory Visit | Attending: Orthopaedic Surgery

## 2012-06-07 ENCOUNTER — Inpatient Hospital Stay (HOSPITAL_COMMUNITY): Payer: BC Managed Care – PPO

## 2012-06-07 ENCOUNTER — Inpatient Hospital Stay (HOSPITAL_COMMUNITY): Payer: BC Managed Care – PPO | Admitting: Anesthesiology

## 2012-06-07 ENCOUNTER — Inpatient Hospital Stay (HOSPITAL_COMMUNITY)
Admission: RE | Admit: 2012-06-07 | Discharge: 2012-06-09 | DRG: 818 | Disposition: A | Discharge status: Home / Self Care | Payer: BC Managed Care – PPO | Source: Ambulatory Visit | Attending: Orthopaedic Surgery | Admitting: Orthopaedic Surgery

## 2012-06-07 ENCOUNTER — Encounter (HOSPITAL_COMMUNITY): Payer: Self-pay | Admitting: *Deleted

## 2012-06-07 ENCOUNTER — Encounter (HOSPITAL_COMMUNITY): Payer: Self-pay | Admitting: Anesthesiology

## 2012-06-07 DIAGNOSIS — Z905 Acquired absence of kidney: Secondary | ICD-10-CM

## 2012-06-07 DIAGNOSIS — M87051 Idiopathic aseptic necrosis of right femur: Secondary | ICD-10-CM

## 2012-06-07 DIAGNOSIS — E663 Overweight: Secondary | ICD-10-CM | POA: Diagnosis present

## 2012-06-07 DIAGNOSIS — M129 Arthropathy, unspecified: Secondary | ICD-10-CM | POA: Diagnosis present

## 2012-06-07 DIAGNOSIS — M87059 Idiopathic aseptic necrosis of unspecified femur: Principal | ICD-10-CM | POA: Diagnosis present

## 2012-06-07 DIAGNOSIS — G4733 Obstructive sleep apnea (adult) (pediatric): Secondary | ICD-10-CM | POA: Diagnosis present

## 2012-06-07 DIAGNOSIS — Z87891 Personal history of nicotine dependence: Secondary | ICD-10-CM

## 2012-06-07 DIAGNOSIS — Z6835 Body mass index (BMI) 35.0-35.9, adult: Secondary | ICD-10-CM

## 2012-06-07 DIAGNOSIS — Z23 Encounter for immunization: Secondary | ICD-10-CM

## 2012-06-07 DIAGNOSIS — E785 Hyperlipidemia, unspecified: Secondary | ICD-10-CM | POA: Diagnosis present

## 2012-06-07 DIAGNOSIS — I10 Essential (primary) hypertension: Secondary | ICD-10-CM | POA: Diagnosis present

## 2012-06-07 DIAGNOSIS — K219 Gastro-esophageal reflux disease without esophagitis: Secondary | ICD-10-CM | POA: Diagnosis present

## 2012-06-07 HISTORY — PX: TOTAL HIP ARTHROPLASTY: SHX124

## 2012-06-07 SURGERY — ARTHROPLASTY, HIP, TOTAL, ANTERIOR APPROACH
Anesthesia: General | Site: Hip | Laterality: Right | Wound class: Clean

## 2012-06-07 MED ORDER — OXYCODONE HCL 5 MG PO TABS: 5.0000 mg | ORAL_TABLET | ORAL | Status: DC | PRN

## 2012-06-07 MED ORDER — KETOROLAC TROMETHAMINE 15 MG/ML IJ SOLN
15.0000 mg | Freq: Four times a day (QID) | INTRAMUSCULAR | Status: AC
  Administered 2012-06-08 (×3): 15 mg via INTRAVENOUS
  Filled 2012-06-07 (×5): qty 1

## 2012-06-07 MED ORDER — FLEET ENEMA 7-19 GM/118ML RE ENEM: 1.0000 | ENEMA | Freq: Once | RECTAL | Status: AC | PRN

## 2012-06-07 MED ORDER — DIPHENHYDRAMINE HCL 12.5 MG/5ML PO ELIX: 12.5000 mg | ORAL_SOLUTION | Freq: Four times a day (QID) | ORAL | Status: DC | PRN

## 2012-06-07 MED ORDER — MORPHINE SULFATE 10 MG/ML IJ SOLN
INTRAMUSCULAR | Status: DC | PRN
  Administered 2012-06-07 (×2): 2 mg via INTRAVENOUS
  Administered 2012-06-07 (×2): 3 mg via INTRAVENOUS

## 2012-06-07 MED ORDER — SENNOSIDES-DOCUSATE SODIUM 8.6-50 MG PO TABS: 1.0000 | ORAL_TABLET | Freq: Every evening | ORAL | Status: DC | PRN

## 2012-06-07 MED ORDER — DEXTROSE 5 % IV SOLN
500.0000 mg | Freq: Four times a day (QID) | INTRAVENOUS | Status: DC | PRN
  Filled 2012-06-07: qty 5

## 2012-06-07 MED ORDER — ONDANSETRON HCL 4 MG/2ML IJ SOLN
4.0000 mg | Freq: Four times a day (QID) | INTRAMUSCULAR | Status: DC | PRN
  Filled 2012-06-07: qty 2

## 2012-06-07 MED ORDER — OXYCODONE HCL 5 MG PO TABS
ORAL_TABLET | ORAL | Status: AC
  Administered 2012-06-07: 10 mg via ORAL
  Filled 2012-06-07: qty 2

## 2012-06-07 MED ORDER — ONDANSETRON HCL 4 MG/2ML IJ SOLN
4.0000 mg | Freq: Four times a day (QID) | INTRAMUSCULAR | Status: DC | PRN
  Administered 2012-06-08: 4 mg via INTRAVENOUS

## 2012-06-07 MED ORDER — ACETAMINOPHEN 650 MG RE SUPP: 650.0000 mg | Freq: Four times a day (QID) | RECTAL | Status: DC | PRN

## 2012-06-07 MED ORDER — HYDROMORPHONE HCL PF 1 MG/ML IJ SOLN
INTRAMUSCULAR | Status: AC
  Administered 2012-06-07: 0.5 mg via INTRAVENOUS
  Filled 2012-06-07: qty 1

## 2012-06-07 MED ORDER — OXYCODONE HCL 5 MG/5ML PO SOLN
5.0000 mg | Freq: Once | ORAL | Status: DC | PRN
Start: 2012-06-07 — End: 2012-06-07

## 2012-06-07 MED ORDER — VECURONIUM BROMIDE 10 MG IV SOLR
INTRAVENOUS | Status: DC | PRN
  Administered 2012-06-07 (×2): 2 mg via INTRAVENOUS
  Administered 2012-06-07: 6 mg via INTRAVENOUS
  Administered 2012-06-07: 4 mg via INTRAVENOUS
  Administered 2012-06-07: 2 mg via INTRAVENOUS

## 2012-06-07 MED ORDER — 0.9 % SODIUM CHLORIDE (POUR BTL) OPTIME
TOPICAL | Status: DC | PRN
  Administered 2012-06-07: 1000 mL

## 2012-06-07 MED ORDER — ZOLPIDEM TARTRATE 5 MG PO TABS: 5.0000 mg | ORAL_TABLET | Freq: Every evening | ORAL | Status: DC | PRN

## 2012-06-07 MED ORDER — GLYCOPYRROLATE 0.2 MG/ML IJ SOLN
INTRAMUSCULAR | Status: DC | PRN
  Administered 2012-06-07: .2 mg via INTRAVENOUS

## 2012-06-07 MED ORDER — DOCUSATE SODIUM 100 MG PO CAPS
100.0000 mg | ORAL_CAPSULE | Freq: Two times a day (BID) | ORAL | Status: DC
  Administered 2012-06-07 – 2012-06-09 (×4): 100 mg via ORAL
  Filled 2012-06-07 (×4): qty 1

## 2012-06-07 MED ORDER — PHENOL 1.4 % MT LIQD: 1.0000 | OROMUCOSAL | Status: DC | PRN

## 2012-06-07 MED ORDER — ONDANSETRON HCL 4 MG/2ML IJ SOLN
INTRAMUSCULAR | Status: DC | PRN
  Administered 2012-06-07: 4 mg via INTRAVENOUS

## 2012-06-07 MED ORDER — ONDANSETRON HCL 4 MG PO TABS: 4.0000 mg | ORAL_TABLET | Freq: Four times a day (QID) | ORAL | Status: DC | PRN

## 2012-06-07 MED ORDER — ASPIRIN EC 325 MG PO TBEC
325.0000 mg | DELAYED_RELEASE_TABLET | Freq: Every day | ORAL | Status: DC
  Administered 2012-06-08 – 2012-06-09 (×2): 325 mg via ORAL
  Filled 2012-06-07 (×3): qty 1

## 2012-06-07 MED ORDER — LACTATED RINGERS IV SOLN
INTRAVENOUS | Status: DC
  Administered 2012-06-07: 12:00:00 via INTRAVENOUS

## 2012-06-07 MED ORDER — MIDAZOLAM HCL 5 MG/5ML IJ SOLN
INTRAMUSCULAR | Status: DC | PRN
  Administered 2012-06-07: 2 mg via INTRAVENOUS

## 2012-06-07 MED ORDER — SODIUM CHLORIDE 0.9 % IJ SOLN: 9.0000 mL | INTRAMUSCULAR | Status: DC | PRN

## 2012-06-07 MED ORDER — FENTANYL CITRATE 0.05 MG/ML IJ SOLN
INTRAMUSCULAR | Status: DC | PRN
  Administered 2012-06-07 (×2): 50 ug via INTRAVENOUS
  Administered 2012-06-07: 100 ug via INTRAVENOUS
  Administered 2012-06-07: 50 ug via INTRAVENOUS

## 2012-06-07 MED ORDER — METOCLOPRAMIDE HCL 5 MG/ML IJ SOLN: 5.0000 mg | Freq: Three times a day (TID) | INTRAMUSCULAR | Status: DC | PRN

## 2012-06-07 MED ORDER — LACTATED RINGERS IV SOLN
INTRAVENOUS | Status: DC | PRN
  Administered 2012-06-07 (×2): via INTRAVENOUS

## 2012-06-07 MED ORDER — METHOCARBAMOL 500 MG PO TABS: 500.0000 mg | ORAL_TABLET | Freq: Four times a day (QID) | ORAL | Status: DC | PRN

## 2012-06-07 MED ORDER — LIDOCAINE HCL (CARDIAC) 20 MG/ML IV SOLN
INTRAVENOUS | Status: DC | PRN
  Administered 2012-06-07: 50 mg via INTRAVENOUS

## 2012-06-07 MED ORDER — MENTHOL 3 MG MT LOZG
1.0000 | LOZENGE | OROMUCOSAL | Status: DC | PRN
  Administered 2012-06-08: 3 mg via ORAL
  Filled 2012-06-07: qty 9

## 2012-06-07 MED ORDER — ACETAMINOPHEN 325 MG PO TABS
650.0000 mg | ORAL_TABLET | Freq: Four times a day (QID) | ORAL | Status: DC | PRN
  Administered 2012-06-08: 650 mg via ORAL
  Filled 2012-06-07: qty 2

## 2012-06-07 MED ORDER — KCL IN DEXTROSE-NACL 20-5-0.45 MEQ/L-%-% IV SOLN
INTRAVENOUS | Status: AC
  Filled 2012-06-07: qty 1000

## 2012-06-07 MED ORDER — ARTIFICIAL TEARS OP OINT
TOPICAL_OINTMENT | OPHTHALMIC | Status: DC | PRN
  Administered 2012-06-07: 1 via OPHTHALMIC

## 2012-06-07 MED ORDER — AMLODIPINE BESYLATE 5 MG PO TABS
5.0000 mg | ORAL_TABLET | Freq: Every morning | ORAL | Status: DC
Start: 2012-06-08 — End: 2012-06-09
  Administered 2012-06-08 – 2012-06-09 (×2): 5 mg via ORAL
  Filled 2012-06-07 (×2): qty 1

## 2012-06-07 MED ORDER — NALOXONE HCL 0.4 MG/ML IJ SOLN: 0.4000 mg | INTRAMUSCULAR | Status: DC | PRN

## 2012-06-07 MED ORDER — PROPOFOL 10 MG/ML IV BOLUS
INTRAVENOUS | Status: DC | PRN
  Administered 2012-06-07: 200 mg via INTRAVENOUS

## 2012-06-07 MED ORDER — ACETAMINOPHEN 10 MG/ML IV SOLN
INTRAVENOUS | Status: AC
  Administered 2012-06-07: 1000 mg via INTRAVENOUS
  Filled 2012-06-07: qty 100

## 2012-06-07 MED ORDER — METHOCARBAMOL 500 MG PO TABS
ORAL_TABLET | ORAL | Status: AC
  Administered 2012-06-07: 500 mg via ORAL
  Filled 2012-06-07: qty 1

## 2012-06-07 MED ORDER — MORPHINE SULFATE (PF) 1 MG/ML IV SOLN
INTRAVENOUS | Status: AC
  Filled 2012-06-07: qty 25

## 2012-06-07 MED ORDER — DIPHENHYDRAMINE HCL 50 MG/ML IJ SOLN: 12.5000 mg | Freq: Four times a day (QID) | INTRAMUSCULAR | Status: DC | PRN

## 2012-06-07 MED ORDER — METOCLOPRAMIDE HCL 10 MG PO TABS: 5.0000 mg | ORAL_TABLET | Freq: Three times a day (TID) | ORAL | Status: DC | PRN

## 2012-06-07 MED ORDER — MORPHINE SULFATE (PF) 1 MG/ML IV SOLN
INTRAVENOUS | Status: DC
  Administered 2012-06-07: 9 mg via INTRAVENOUS
  Administered 2012-06-08: 1.5 mg via INTRAVENOUS
  Administered 2012-06-08: 6 mg via INTRAVENOUS
  Administered 2012-06-08: 3 mg via INTRAVENOUS
  Administered 2012-06-09: 1.5 mg via INTRAVENOUS
  Filled 2012-06-07: qty 25

## 2012-06-07 MED ORDER — EPHEDRINE SULFATE 50 MG/ML IJ SOLN
INTRAMUSCULAR | Status: DC | PRN
  Administered 2012-06-07: 5 mg via INTRAVENOUS
  Administered 2012-06-07 (×2): 10 mg via INTRAVENOUS

## 2012-06-07 MED ORDER — KCL IN DEXTROSE-NACL 20-5-0.45 MEQ/L-%-% IV SOLN
INTRAVENOUS | Status: DC
  Administered 2012-06-08 – 2012-06-09 (×2): via INTRAVENOUS
  Filled 2012-06-07 (×5): qty 1000

## 2012-06-07 MED ORDER — BISACODYL 10 MG RE SUPP
10.0000 mg | Freq: Every day | RECTAL | Status: DC | PRN
  Administered 2012-06-09: 10 mg via RECTAL
  Filled 2012-06-07: qty 1

## 2012-06-07 MED ORDER — OXYCODONE HCL 5 MG PO TABS: 5.0000 mg | ORAL_TABLET | Freq: Once | ORAL | Status: DC | PRN

## 2012-06-07 MED ORDER — LISINOPRIL 40 MG PO TABS
40.0000 mg | ORAL_TABLET | Freq: Every day | ORAL | Status: DC
  Administered 2012-06-08 – 2012-06-09 (×2): 40 mg via ORAL
  Filled 2012-06-07 (×2): qty 1

## 2012-06-07 MED ORDER — CELECOXIB 200 MG PO CAPS
200.0000 mg | ORAL_CAPSULE | Freq: Two times a day (BID) | ORAL | Status: DC
  Administered 2012-06-07 – 2012-06-09 (×4): 200 mg via ORAL
  Filled 2012-06-07 (×5): qty 1

## 2012-06-07 MED ORDER — KETOROLAC TROMETHAMINE 30 MG/ML IJ SOLN
INTRAMUSCULAR | Status: AC
  Administered 2012-06-07: 15 mg
  Filled 2012-06-07: qty 1

## 2012-06-07 MED ORDER — HYDROMORPHONE HCL PF 1 MG/ML IJ SOLN
0.2500 mg | INTRAMUSCULAR | Status: DC | PRN
  Administered 2012-06-07: 0.5 mg via INTRAVENOUS

## 2012-06-07 SURGICAL SUPPLY — 52 items
ADH SKN CLS APL DERMABOND .7 (GAUZE/BANDAGES/DRESSINGS) ×1
BLADE SAW SGTL 18X1.27X75 (BLADE) ×2 IMPLANT
BLADE SURG ROTATE 9660 (MISCELLANEOUS) IMPLANT
CELLS DAT CNTRL 66122 CELL SVR (MISCELLANEOUS) ×1 IMPLANT
CLOTH BEACON ORANGE TIMEOUT ST (SAFETY) ×2 IMPLANT
COVER SURGICAL LIGHT HANDLE (MISCELLANEOUS) ×2 IMPLANT
DERMABOND ADVANCED (GAUZE/BANDAGES/DRESSINGS) ×1
DERMABOND ADVANCED .7 DNX12 (GAUZE/BANDAGES/DRESSINGS) ×1 IMPLANT
DRAPE C-ARM 42X72 X-RAY (DRAPES) ×2 IMPLANT
DRAPE STERI IOBAN 125X83 (DRAPES) ×2 IMPLANT
DRAPE U-SHAPE 47X51 STRL (DRAPES) ×6 IMPLANT
DRSG MEPILEX BORDER 4X8 (GAUZE/BANDAGES/DRESSINGS) ×2 IMPLANT
DURAPREP 26ML APPLICATOR (WOUND CARE) ×2 IMPLANT
ELECT BLADE 4.0 EZ CLEAN MEGAD (MISCELLANEOUS)
ELECT BLADE TIP CTD 4 INCH (ELECTRODE) ×2 IMPLANT
ELECT CAUTERY BLADE 6.4 (BLADE) ×2 IMPLANT
ELECT REM PT RETURN 9FT ADLT (ELECTROSURGICAL) ×2
ELECTRODE BLDE 4.0 EZ CLN MEGD (MISCELLANEOUS) IMPLANT
ELECTRODE REM PT RTRN 9FT ADLT (ELECTROSURGICAL) ×1 IMPLANT
FACESHIELD LNG OPTICON STERILE (SAFETY) ×4 IMPLANT
GAUZE XEROFORM 1X8 LF (GAUZE/BANDAGES/DRESSINGS) ×1 IMPLANT
GLOVE BIOGEL PI IND STRL 7.5 (GLOVE) ×1 IMPLANT
GLOVE BIOGEL PI IND STRL 8 (GLOVE) ×1 IMPLANT
GLOVE BIOGEL PI INDICATOR 7.5 (GLOVE) ×1
GLOVE BIOGEL PI INDICATOR 8 (GLOVE) ×1
GLOVE ECLIPSE 7.0 STRL STRAW (GLOVE) ×2 IMPLANT
GLOVE ORTHO TXT STRL SZ7.5 (GLOVE) ×2 IMPLANT
GOWN PREVENTION PLUS LG XLONG (DISPOSABLE) IMPLANT
GOWN STRL NON-REIN LRG LVL3 (GOWN DISPOSABLE) ×4 IMPLANT
GOWN STRL REIN XL XLG (GOWN DISPOSABLE) ×2 IMPLANT
KIT BASIN OR (CUSTOM PROCEDURE TRAY) ×2 IMPLANT
KIT ROOM TURNOVER OR (KITS) ×2 IMPLANT
MANIFOLD NEPTUNE II (INSTRUMENTS) ×2 IMPLANT
NS IRRIG 1000ML POUR BTL (IV SOLUTION) ×2 IMPLANT
PACK TOTAL JOINT (CUSTOM PROCEDURE TRAY) ×2 IMPLANT
PAD ARMBOARD 7.5X6 YLW CONV (MISCELLANEOUS) ×4 IMPLANT
RETRACTOR WND ALEXIS 18 MED (MISCELLANEOUS) ×1 IMPLANT
RTRCTR WOUND ALEXIS 18CM MED (MISCELLANEOUS) ×2
SPONGE LAP 18X18 X RAY DECT (DISPOSABLE) ×2 IMPLANT
SPONGE LAP 4X18 X RAY DECT (DISPOSABLE) IMPLANT
STAPLER VISISTAT 35W (STAPLE) ×2 IMPLANT
SUT ETHIBOND NAB CT1 #1 30IN (SUTURE) ×4 IMPLANT
SUT VIC AB 0 CT1 27 (SUTURE) ×4
SUT VIC AB 0 CT1 27XBRD ANBCTR (SUTURE) ×2 IMPLANT
SUT VIC AB 1 CT1 27 (SUTURE) ×4
SUT VIC AB 1 CT1 27XBRD ANBCTR (SUTURE) ×2 IMPLANT
SUT VIC AB 2-0 CT1 27 (SUTURE) ×4
SUT VIC AB 2-0 CT1 TAPERPNT 27 (SUTURE) ×2 IMPLANT
TOWEL OR 17X24 6PK STRL BLUE (TOWEL DISPOSABLE) ×2 IMPLANT
TOWEL OR 17X26 10 PK STRL BLUE (TOWEL DISPOSABLE) ×4 IMPLANT
TRAY FOLEY CATH 14FR (SET/KITS/TRAYS/PACK) ×1 IMPLANT
WATER STERILE IRR 1000ML POUR (IV SOLUTION) ×4 IMPLANT

## 2012-06-07 NOTE — Anesthesia Procedure Notes (Signed)
Procedure Name: Intubation Date/Time: 06/07/2012 12:44 PM Performed by: Carmela Rima Pre-anesthesia Checklist: Patient identified, Timeout performed, Emergency Drugs available, Suction available and Patient being monitored Patient Re-evaluated:Patient Re-evaluated prior to inductionOxygen Delivery Method: Circle system utilized Preoxygenation: Pre-oxygenation with 100% oxygen Intubation Type: IV induction Ventilation: Mask ventilation without difficulty Laryngoscope Size: Mac and 4 Grade View: Grade II Tube type: Oral Tube size: 7.5 mm Number of attempts: 1 Airway Equipment and Method: Patient positioned with wedge pillow Placement Confirmation: positive ETCO2,  ETT inserted through vocal cords under direct vision and breath sounds checked- equal and bilateral Secured at: 23 cm Tube secured with: Tape Dental Injury: Teeth and Oropharynx as per pre-operative assessment

## 2012-06-07 NOTE — Interval H&P Note (Signed)
History and Physical Interval Note:  06/07/2012 12:23 PM  Blake Solis  has presented today for surgery, with the diagnosis of Right Hip AVN  (avascular necrosis)  The various methods of treatment have been discussed with the patient and family. After consideration of risks, benefits and other options for treatment, the patient has consented to  Procedure(s) with comments: TOTAL HIP ARTHROPLASTY ANTERIOR APPROACH (Right) - Right Total Hip Arthroplasty-Anterior Approach as a surgical intervention .  The patient's history has been reviewed, patient examined, no change in status, stable for surgery.  I have reviewed the patient's chart and labs.  Questions were answered to the patient's satisfaction.     Zaela Graley C

## 2012-06-07 NOTE — Transfer of Care (Signed)
Immediate Anesthesia Transfer of Care Note  Patient: Blake Solis  Procedure(s) Performed: Procedure(s) with comments: TOTAL HIP ARTHROPLASTY ANTERIOR APPROACH (Right) - Right Total Hip Arthroplasty-Anterior Approach  Patient Location: PACU  Anesthesia Type:General  Level of Consciousness: awake, alert  and oriented  Airway & Oxygen Therapy: Patient Spontanous Breathing and Patient connected to nasal cannula oxygen  Post-op Assessment: Report given to PACU RN, Post -op Vital signs reviewed and stable and Patient moving all extremities X 4  Post vital signs: Reviewed and stable  Complications: No apparent anesthesia complications

## 2012-06-07 NOTE — Preoperative (Signed)
Beta Blockers   Reason not to administer Beta Blockers:Not Applicable 

## 2012-06-07 NOTE — Anesthesia Preprocedure Evaluation (Addendum)
Anesthesia Evaluation  Patient identified by MRN, date of birth, ID band Patient awake    Reviewed: Allergy & Precautions, H&P , NPO status , Patient's Chart, lab work & pertinent test results  Airway Mallampati: III TM Distance: >3 FB Neck ROM: Full    Dental no notable dental hx. (+) Teeth Intact and Dental Advisory Given   Pulmonary neg pulmonary ROS, neg sleep apnea,  breath sounds clear to auscultation  Pulmonary exam normal       Cardiovascular hypertension, On Medications negative cardio ROS  Rhythm:Regular Rate:Normal     Neuro/Psych negative neurological ROS  negative psych ROS   GI/Hepatic Neg liver ROS, GERD-  Controlled and Medicated,  Endo/Other  negative endocrine ROS  Renal/GU negative Renal ROS  negative genitourinary   Musculoskeletal   Abdominal   Peds  Hematology negative hematology ROS (+)   Anesthesia Other Findings   Reproductive/Obstetrics negative OB ROS                          Anesthesia Physical Anesthesia Plan  ASA: II  Anesthesia Plan: General   Post-op Pain Management:    Induction: Intravenous  Airway Management Planned: Oral ETT  Additional Equipment:   Intra-op Plan:   Post-operative Plan: Extubation in OR  Informed Consent: I have reviewed the patients History and Physical, chart, labs and discussed the procedure including the risks, benefits and alternatives for the proposed anesthesia with the patient or authorized representative who has indicated his/her understanding and acceptance.   Dental advisory given  Plan Discussed with: CRNA  Anesthesia Plan Comments:         Anesthesia Quick Evaluation

## 2012-06-07 NOTE — Progress Notes (Signed)
Orthopedic Tech Progress Note Patient Details:  Blake Solis Aug 21, 1955 [id number] Applied overhead frame to bed.     Jennye Moccasin 06/07/2012, 8:44 PM

## 2012-06-07 NOTE — Anesthesia Postprocedure Evaluation (Signed)
  Anesthesia Post-op Note  Patient: Blake Solis  Procedure(s) Performed: Procedure(s) with comments: TOTAL HIP ARTHROPLASTY ANTERIOR APPROACH (Right) - Right Total Hip Arthroplasty-Anterior Approach  Patient Location: PACU  Anesthesia Type:General  Level of Consciousness: awake and alert   Airway and Oxygen Therapy: Patient Spontanous Breathing and Patient connected to nasal cannula oxygen  Post-op Pain: mild  Post-op Assessment: Post-op Vital signs reviewed, Patient's Cardiovascular Status Stable, Respiratory Function Stable, Patent Airway and No signs of Nausea or vomiting  Post-op Vital Signs: Reviewed and stable  Complications: No apparent anesthesia complications

## 2012-06-07 NOTE — Brief Op Note (Cosign Needed)
06/07/2012  3:45 PM  PATIENT:  Blake Solis  57 y.o. male  PRE-OPERATIVE DIAGNOSIS:  Right Hip AVN  (avascular necrosis)  POST-OPERATIVE DIAGNOSIS:  Right Hip AVN  (avascular necrosis)  PROCEDURE:  Procedure(s) with comments: TOTAL HIP ARTHROPLASTY ANTERIOR APPROACH (Right) - Right Total Hip Arthroplasty-Anterior Approach  SURGEON:  Surgeon(s) and Role:    * Eldred Manges, MD - Primary  PHYSICIAN ASSISTANT: Maud Deed [facility]  ASSISTANTS: none   ANESTHESIA:   general  EBL:  Total I/O In: 1800 [I.V.:1800] Out: 700 [Urine:300; Blood:400]  BLOOD ADMINISTERED:none  DRAINS: none   LOCAL MEDICATIONS USED:  NONE  SPECIMEN:  No Specimen  DISPOSITION OF SPECIMEN:  N/A  COUNTS:  YES  TOURNIQUET:  * No tourniquets in log *  DICTATION: .Note written in EPIC  PLAN OF CARE: Admit to inpatient   PATIENT DISPOSITION:  PACU - hemodynamically stable.   Delay start of Pharmacological VTE agent (>24hrs) due to surgical blood loss or risk of bleeding: no

## 2012-06-08 ENCOUNTER — Encounter (HOSPITAL_COMMUNITY): Payer: Self-pay | Admitting: General Practice

## 2012-06-08 HISTORY — PX: TOTAL HIP ARTHROPLASTY: SHX124

## 2012-06-08 LAB — BASIC METABOLIC PANEL
CO2: 20 mEq/L (ref 19–32)
Calcium: 8.6 mg/dL (ref 8.4–10.5)
Creatinine, Ser: 1.02 mg/dL (ref 0.50–1.35)
GFR calc non Af Amer: 80 mL/min — ABNORMAL LOW (ref >90)
Glucose, Bld: 117 mg/dL — ABNORMAL HIGH (ref 70–99)
Sodium: 135 mEq/L (ref 135–145)

## 2012-06-08 LAB — CBC
MCH: 31.2 pg (ref 26.0–34.0)
MCHC: 35.1 g/dL (ref 30.0–36.0)
MCV: 88.9 fL (ref 78.0–100.0)
Platelets: UNDETERMINED 10*3/uL (ref 150–400)
RBC: 4.49 MIL/uL (ref 4.22–5.81)

## 2012-06-08 MED ORDER — INFLUENZA VIRUS VACC SPLIT PF IM SUSP
0.5000 mL | INTRAMUSCULAR | Status: AC
  Administered 2012-06-09: 0.5 mL via INTRAMUSCULAR
  Filled 2012-06-08: qty 0.5

## 2012-06-08 NOTE — Op Note (Signed)
NAMEAMDREW, OBOYLE              ACCOUNT NO.:  [id number]  MEDICAL RECORD NO.:  [id number]  LOCATION:  5N21C                        FACILITY:  [facility]  PHYSICIAN:  Lavanda Nevels C. Ophelia Charter, M.D.    DATE OF BIRTH:  11-05-1955  DATE OF PROCEDURE:  06/07/2012 DATE OF DISCHARGE:                              OPERATIVE REPORT   PREOPERATIVE DIAGNOSIS:  Right hip avascular necrosis with collapse.  POSTOPERATIVE DIAGNOSIS:  Right hip avascular necrosis with collapse.  PROCEDURE:  Right total hip arthroplasty, direct anterior approach.  SURGEON:  Monteen Toops C. Ophelia Charter, MD  ASSISTANT:  Maud Deed, PA-C, medically necessary and present for the entire procedure.  ANESTHESIA:  GOT.  EBL:  Less than 100 mL.  COMPLICATIONS:  Medial femoral perforation and subtroch.  PROCEDURE:  This patient had avascular necrosis of the hip.  This patient had avascular necrosis with femoral head collapse, which is unilateral.  He had severe hip arthritis, failed conservative treatment with secondary degenerative changes in the acetabulum.  After induction of general anesthesia, orotracheal intubation, Ancef prophylaxis, the  patient was placed on the [place] frame.  After boots __________ applied with the knees in flexed position to make sure the heel was down.  Postprocedure, the patient had a Foley catheter placed. Standard prepping was performed after C-arm was brought in. Localization to make sure that the positioning was good.  The rami were identical position and both trochanters were able to be visualized for leg length confirmation checking.  The C-arm was backed out prepping with DuraPrep.  While the DuraPrep was drying, time-out procedure was completed.  Usual split sheets, drapes, large shower curtain, Betadine, Steri-Drape was applied.  Half sheet on the opposite side, half sheet above.  Incision was made starting fingerbreadths distal 2 cm medial to the ASIS extending obliquely distally toward the anterior  aspect of the intertroch line and stopping about the level of the lesser trochanter. Subcutaneous tissue was split.  Fascia was identified.  Had soft tissue cleaned off the fascia and the rubber skin protector was then inserted underneath the skin and above the fascia.  An Allis clamp was placed on the fascia.  Medial plane was developed, and then [place] retractor_________ placed over the medial aspect of the medial femur at the neck.  Soft tissue was cleaned off with some fat.  A tonsil clamp was used for Bovie.  The transverse bleeders coming across the inner trocar line with meticulous hemostasis.  After the fat had been removed, the anterior capsule was identified.  It was opened in a L shaped cut.  Anterior capsules were completely released, tissues were released out over the trochanter.  The saw blade was used, checked under fluoroscopy, extended slightly more distal, so was cut 1 fingerbreadth above the lesser trochanter. Preparation acetabulum was performed after neck was cut finishing the lateral aspect with an osteotome and then removing the head with a corkscrew first under power then by hand.  Labrum was excised. Sequential reaming checking under fluoroscopy up to a 52 cup.  Cup was inserted, checked under fluoroscopy.  Initially, once it looked like it was in good position.  As the primary cut was being inserted, trial was placed  in the femur after broaching and head banged against the acetabulum as reduction is being performed shifting the cups.  Cup was readjusted under fluoroscopy, was abducted 45 degrees, flexion 20 degrees, then impacted into place.  This good medial wall, good bleeding bone surface and cup was secured, would not move.  No wall poly was inserted after a central was filled.  On the femoral side initially started with the peanut which looked good.  Trochanter was cut 11 mm above the lesser trochanter, and then sequential broaching.  Somewhat likely on the  10 broach, there was perforation medial and posterior at the level to lesser trochanter, just inferior of the lesser trochanter. This was recognized, seen under fluoroscopy, corrected tacking up to the peanut and then again sequentially broaching, checking each broach 8, 9, 10, and then 11 to make sure it was directly down the canal.  Calcar was stable.  Trochanter were stable.  Femur was rotated, checked under fluoroscopy and looked good.  Then, the small perforation with the tip of the broach had gone through.  A #11 chronic stem was inserted, was stable.  Appropriate version was checked with the intertroch line.  A +1.5 ball was selected.  Preoperatively, the patient was short, postoperatively measured with good length may have been 1 or 2 mm long. The neck cut was 1 fingerbreadth above the lesser trochanter with +1.5 neck and good position of the acetabulum.  There was good stability. Femur was rotated, checked under fluoroscopy.  After irrigation, the capsule had been removed due to the patient's shortening with avascular necrosis, and there was not anterior capsule to help close.  Fascia was repaired with running locking suture _running_________ subcutaneous tissue, subcuticular closure.  Marcaine infiltration in the skin.  Steri-Strips, postop dressing, and transferred to recovery room.  Instrument count and needle count was correct.     Brighten Orndoff C. Ophelia Charter, M.D.     MCY/MEDQ  D:  06/07/2012  T:  06/07/2012  Job:  [id number]

## 2012-06-08 NOTE — Evaluation (Signed)
Occupational Therapy Evaluation Patient Details Name: Blake Solis MRN: [id number] DOB: Aug 09, 1955 Today's Date: 06/08/2012 Time: 4098-1191 OT Time Calculation (min): 25 min  OT Assessment / Plan / Recommendation Clinical Impression  Pt s/p R THA. Pt able to perform functional mobility at min guard level and requiring min assist for LB tasks.  Will have 24/7 sup/assist at home.  All education completed and no further acute OT needs at this time.  Will sign off.    OT Assessment  Patient does not need any further OT services    Follow Up Recommendations  No OT follow up;Supervision/Assistance - 24 hour    Barriers to Discharge      Equipment Recommendations  None recommended by OT    Recommendations for Other Services    Frequency       Precautions / Restrictions Precautions Precautions: Anterior Hip Precaution Comments: Educated pt on anterior hip precuations. Restrictions Weight Bearing Restrictions: Yes RLE Weight Bearing: Partial weight bearing RLE Partial Weight Bearing Percentage or Pounds: 50   Pertinent Vitals/Pain See vitals    ADL  Eating/Feeding: Performed;Independent Where Assessed - Eating/Feeding: Chair Grooming: Performed;Wash/dry hands;Supervision/safety Where Assessed - Grooming: Unsupported standing Upper Body Bathing: Simulated;Set up Where Assessed - Upper Body Bathing: Unsupported sitting Lower Body Bathing: Simulated;Minimal assistance Where Assessed - Lower Body Bathing: Unsupported sitting Upper Body Dressing: Simulated;Set up Where Assessed - Upper Body Dressing: Unsupported sitting Lower Body Dressing: Simulated;Minimal assistance Where Assessed - Lower Body Dressing: Unsupported sitting Toilet Transfer: Performed;Min guard Toilet Transfer Method: Sit to stand Toilet Transfer Equipment: Comfort height toilet;Grab bars Toileting - Clothing Manipulation and Hygiene: Performed;Min guard Where Assessed - Engineer, mining and  Hygiene: Sit to stand from 3-in-1 or toilet Equipment Used: Rolling walker Transfers/Ambulation Related to ADLs: supervision ambulating with RW ADL Comments: Pt min assist with LB tasks but reports wife will be able to assist. Wife present during session and also verbalized she will assist pt with ADLs at home.  Pt reporting he has tub transfer bench at home and able to verbalize correct tub transfer technique.     OT Diagnosis:    OT Problem List:   OT Treatment Interventions:     OT Goals    Visit Information  Last OT Received On: 06/08/12 Assistance Needed: +1    Subjective Data      Prior Functioning     Home Living Lives With: Spouse;Family;Daughter Available Help at Discharge: Family;Available 24 hours/day Type of Home: House Home Access: Stairs to enter [employer] of Steps: 1 Entrance Stairs-Rails: None Home Layout: One level Bathroom Shower/Tub: Forensic scientist: Standard Bathroom Accessibility: Yes How Accessible: Accessible via walker Home Adaptive Equipment: Bedside commode/3-in-1;Hand-held shower hose;Walker - rolling;Straight cane;Tub transfer bench Prior Function Level of Independence: Independent with assistive device(s) Driving: Yes Vocation: Full time employment Communication Communication: No difficulties Dominant Hand: Left         Vision/Perception     Cognition  Cognition Overall Cognitive Status: Appears within functional limits for tasks assessed/performed Arousal/Alertness: Awake/alert Orientation Level: Appears intact for tasks assessed Behavior During Session: [facility] for tasks performed    Extremity/Trunk Assessment Right Upper Extremity Assessment RUE ROM/Strength/Tone: [facility] for tasks assessed Left Upper Extremity Assessment LUE ROM/Strength/Tone: WFL for tasks assessed     Mobility Bed Mobility Bed Mobility: Not assessed Transfers Transfers: Sit to Stand;Stand to Sit Sit to Stand: 4: Min  guard;From chair/3-in-1;From toilet;With upper extremity assist;With armrests Stand to Sit: 4: Min guard;To chair/3-in-1;To toilet;With armrests;With upper  extremity assist Details for Transfer Assistance: Cues for technique and hand placement. Guarding for safety. No physical assist needed.     Exercise     Balance     End of Session OT - End of Session Equipment Utilized During Treatment:  (RW) Activity Tolerance: Patient tolerated treatment well Patient left: in chair;with call bell/phone within reach;with family/visitor present Nurse Communication: Mobility status  GO   06/08/2012 Blake Solis OTR/L Pager [phone] Office [phone]   Blake Solis, [place] 06/08/2012, 4:22 PM

## 2012-06-08 NOTE — Evaluation (Signed)
Physical Therapy Evaluation Patient Details Name: Blake Solis MRN: [id number] DOB: [birth year] Today's Date: 06/08/2012 Time: 4098-1191 PT Time Calculation (min): 19 min  PT Assessment / Plan / Recommendation Clinical Impression  Pt is a 57 y/o male s/p R THA.  Pt doing well with mobility. Acute PT to follow pt to progress mobility for safe d/c to home.      PT Assessment  Patient needs continued PT services    Follow Up Recommendations  Home health PT    Does the patient have the potential to tolerate intense rehabilitation      Barriers to Discharge None      Equipment Recommendations  None recommended by PT    Recommendations for Other Services     Frequency 7X/week    Precautions / Restrictions Precautions Precautions: Anterior Hip Precaution Booklet Issued: Yes (comment) Precaution Comments: Educated pt on anterior hip precautions and PWB on RLE.  Restrictions Weight Bearing Restrictions: Yes RLE Weight Bearing: Partial weight bearing RLE Partial Weight Bearing Percentage or Pounds: 50   Pertinent Vitals/Pain 4/10 pain in hip with movement.  Pt medicated prior to session.       Mobility  Bed Mobility Bed Mobility: Supine to Sit;Sit to Supine Supine to Sit: 4: Min assist;HOB flat Sit to Supine: Not Tested (comment) Details for Bed Mobility Assistance: Assist for RLE.   Transfers Transfers: Sit to Stand;Stand to Sit Sit to Stand: 4: Min assist;From bed;With upper extremity assist Stand to Sit: 4: Min assist;To chair/3-in-1;With upper extremity assist Details for Transfer Assistance: Cues for technique to maintain hip precautions.  Assist to steady pt  Ambulation/Gait Ambulation/Gait Assistance: 5: Supervision Ambulation Distance (Feet): 60 Feet Assistive device: Rolling walker Ambulation/Gait Assistance Details: VCs for gait sequencing and PWB on RLE>  Gait Pattern: Step-to pattern;Decreased stride length Stairs: No Wheelchair Mobility Wheelchair  Mobility: No    Exercises Total Joint Exercises Ankle Circles/Pumps: Both;10 reps Gluteal Sets: Both;10 reps  Heel slides Right; 10 reps.     PT Diagnosis: Difficulty walking;Acute pain  PT Problem List: Decreased strength;Decreased range of motion;Decreased mobility;Decreased knowledge of use of DME;Pain;Decreased knowledge of precautions PT Treatment Interventions: Gait training;Stair training;DME instruction;Functional mobility training;Therapeutic activities;Therapeutic exercise;Patient/family education   PT Goals Acute Rehab PT Goals PT Goal Formulation: With patient Time For Goal Achievement: 06/15/12 Potential to Achieve Goals: Good Pt will go Supine/Side to Sit: with modified independence PT Goal: Supine/Side to Sit - Progress: Goal set today Pt will go Sit to Supine/Side: with modified independence PT Goal: Sit to Supine/Side - Progress: Goal set today Pt will Transfer Bed to Chair/Chair to Bed: with modified independence PT Transfer Goal: Bed to Chair/Chair to Bed - Progress: Goal set today Pt will Ambulate: 51 - 150 feet;with modified independence;with rolling walker PT Goal: Ambulate - Progress: Goal set today Pt will Go Up / Down Stairs: 1-2 stairs;with supervision;with rolling walker PT Goal: Up/Down Stairs - Progress: Goal set today  Visit Information  Last PT Received On: 06/08/12 Assistance Needed: +1    Subjective Data  Subjective: Agree to PT eval.  Patient Stated Goal: Return to work .     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) Driving: Yes Vocation: Full time employment 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[employer] Communication: No difficulties Dominant Hand: Left    Cognition  Cognition Overall Cognitive Status: Appears within functional limits for tasks assessed/performed Arousal/Alertness: Awake/alert Orientation Level: Appears intact for tasks assessed Behavior During Session: [facility] for tasks performed    Extremity/Trunk Assessment Right Upper Extremity Assessment RUE ROM/Strength/Tone: Within functional levels Left Upper Extremity Assessment LUE ROM/Strength/Tone: Within functional levels Right Lower Extremity Assessment RLE ROM/Strength/Tone: Unable to fully assess;Due to pain;Due to precautions Left Lower Extremity Assessment LLE ROM/Strength/Tone: [facility] for tasks assessed   Balance    End of Session PT - End of Session Equipment Utilized During Treatment: Gait belt Activity Tolerance: Patient tolerated treatment well Patient left: in chair;with call bell/phone within reach Nurse Communication: Mobility status  GP     Blake Solis 06/08/2012, 12:03 PM

## 2012-06-08 NOTE — Progress Notes (Signed)
Physical Therapy Treatment Patient Details Name: Blake Solis MRN: [id number] DOB: 02-27-1956 Today's Date: 06/08/2012 Time: 4098-1191 PT Time Calculation (min): 40 min  PT Assessment / Plan / Recommendation Comments on Treatment Session  Pt doing well with mobilty.  Should be able to d/c home after a.m. session of PT tomorrow.  Will instruct pt in stairs tomorrow.      Follow Up Recommendations  Home health PT     Does the patient have the potential to tolerate intense rehabilitation     Barriers to Discharge        Equipment Recommendations  None recommended by PT    Recommendations for Other Services    Frequency 7X/week   Plan Discharge plan remains appropriate;Frequency remains appropriate    Precautions / Restrictions Precautions Precautions: Anterior Hip Precaution Booklet Issued: Yes (comment) Precaution Comments: Pt able to recall 3/3 hip precautions.  Restrictions Weight Bearing Restrictions: Yes RLE Weight Bearing: Partial weight bearing RLE Partial Weight Bearing Percentage or Pounds: 50   Pertinent Vitals/Pain 2/10 pain in anterior hip.  No pain intervention required per pt.     Mobility  Bed Mobility Bed Mobility: Sit to Supine Supine to Sit: Not tested (comment) Sit to Supine: 5: Supervision Details for Bed Mobility Assistance: Pt able to manage RLE independently Transfers Transfers: Sit to Stand;Stand to Sit Sit to Stand: 6: Modified independent (Device/Increase time);From bed;From chair/3-in-1;With upper extremity assist Stand to Sit: 6: Modified independent (Device/Increase time);To bed;To chair/3-in-1;With upper extremity assist Details for Transfer Assistance: Pt demonstrating safe technique each trial.  Ambulation/Gait Ambulation/Gait Assistance: 5: Supervision;6: Modified independent (Device/Increase time) Ambulation Distance (Feet): 100 Feet Assistive device: Rolling walker Ambulation/Gait Assistance Details: Pt demonstrating safety and  independence with PWB.   Gait Pattern: Step-to pattern;Decreased stride length Stairs: No Wheelchair Mobility Wheelchair Mobility: No    Exercises Total Joint Exercises Ankle Circles/Pumps: Both;10 reps Quad Sets: 10 reps;Right;AROM Gluteal Sets: Both;10 reps Short Arc Quad: 10 reps;Right;AROM Heel Slides: 10 reps;Right;AROM Bridges: 10 reps;Supine   PT Diagnosis:    PT Problem List:   PT Treatment Interventions:     PT Goals Acute Rehab PT Goals PT Goal Formulation: With patient Time For Goal Achievement: 06/15/12 Potential to Achieve Goals: Good Pt will go Supine/Side to Sit: with modified independence Pt will go Sit to Supine/Side: with modified independence PT Goal: Sit to Supine/Side - Progress: Met Pt will Transfer Bed to Chair/Chair to Bed: with modified independence PT Transfer Goal: Bed to Chair/Chair to Bed - Progress: Met Pt will Ambulate: 51 - 150 feet;with modified independence;with rolling walker PT Goal: Ambulate - Progress: Met Pt will Go Up / Down Stairs: 1-2 stairs;with supervision;with rolling walker Additional Goals Additional Goal #1: Pt will verbalize 3/3 anterior hip precaution and demonstrate PWB in RLE.  PT Goal: Additional Goal #1 - Progress: Met  Visit Information  Last PT Received On: 06/08/12 Assistance Needed: +1    Subjective Data  Subjective: no new c/o Patient Stated Goal: Return to work .     Cognition  Cognition Overall Cognitive Status: Appears within functional limits for tasks assessed/performed Arousal/Alertness: Awake/alert Orientation Level: Appears intact for tasks assessed Behavior During Session: [facility] for tasks performed    Balance  Balance Balance Assessed: No  End of Session PT - End of Session Equipment Utilized During Treatment: Gait belt Activity Tolerance: Patient tolerated treatment well Patient left: in bed;with call bell/phone within reach Nurse Communication: Mobility status   GP      Alferd Apa 06/08/2012,  6:31 PM Lathon Adan L. Kyrstin Campillo DPT [phone]

## 2012-06-08 NOTE — Progress Notes (Signed)
Utilization Review Completed.   Terran Klinke, RN, BSN Nurse Case Manager  [phone]  

## 2012-06-08 NOTE — Progress Notes (Signed)
Subjective: 1 Day Post-Op Procedure(s) (LRB): TOTAL HIP ARTHROPLASTY ANTERIOR APPROACH (Right) Patient reports pain as mild.    Objective: Vital signs in last 24 hours: Temp:  [97.5 F (36.4 C)-98.9 F (37.2 C)] 98.9 F (37.2 C) (03/20 0948) Pulse Rate:  [57-100] 62 (03/20 0948) Resp:  [10-22] 19 (03/20 1004) BP: (107-152)/(57-96) 116/57 mmHg (03/20 0948) SpO2:  [95 %-100 %] 98 % (03/20 1004)  Intake/Output from previous day: 03/19 0701 - 03/20 0700 In: 2611.3 [I.V.:2611.3] Out: 700 [Urine:300; Blood:400] Intake/Output this shift:     Recent Labs  06/08/12 0620  HGB 14.0    Recent Labs  06/08/12 0620  WBC 7.8  RBC 4.49  HCT 39.9  PLT PLATELET CLUMPS NOTED ON SMEAR, UNABLE TO ESTIMATE    Recent Labs  06/08/12 0620  NA 135  K 4.7  CL 101  CO2 20  BUN 16  CREATININE 1.02  GLUCOSE 117*  CALCIUM 8.6   No results found for this basename: LABPT, INR,  in the last 72 hours  Neurovascular intact Intact pulses distally Dorsiflexion/Plantar flexion intact Incision: dressing C/D/I  Assessment/Plan: 1 Day Post-Op Procedure(s) (LRB): TOTAL HIP ARTHROPLASTY ANTERIOR APPROACH (Right) Up with therapy D/C IV fluids Plan for discharge tomorrow if does well with PT and pain controlled. Strictly 50% PWB to right leg  Blake Solis 06/08/2012, 10:50 AM

## 2012-06-09 LAB — CBC
MCH: 31.1 pg (ref 26.0–34.0)
MCHC: 35.4 g/dL (ref 30.0–36.0)
Platelets: 171 10*3/uL (ref 150–400)
RBC: 3.96 MIL/uL — ABNORMAL LOW (ref 4.22–5.81)

## 2012-06-09 MED ORDER — ASPIRIN 325 MG PO TBEC: 325.0000 mg | DELAYED_RELEASE_TABLET | Freq: Every day | ORAL | Status: DC

## 2012-06-09 MED ORDER — METHOCARBAMOL 500 MG PO TABS: 500.0000 mg | ORAL_TABLET | Freq: Four times a day (QID) | ORAL | Status: DC | PRN

## 2012-06-09 MED ORDER — OXYCODONE-ACETAMINOPHEN 5-325 MG PO TABS: 1.0000 | ORAL_TABLET | ORAL | Status: DC | PRN

## 2012-06-09 NOTE — Progress Notes (Signed)
Physical Therapy Treatment Patient Details Name: Blake Solis MRN: [id number] DOB: 1956/03/10 Today's Date: 06/09/2012 Time: 4098-1191 PT Time Calculation (min): 13 min  PT Assessment / Plan / Recommendation Comments on Treatment Session  Completed stair training. Patient safe to discharge home based on goals set on eval    Follow Up Recommendations  Home health PT     Does the patient have the potential to tolerate intense rehabilitation     Barriers to Discharge        Equipment Recommendations  None recommended by PT    Recommendations for Other Services    Frequency 7X/week   Plan Discharge plan remains appropriate;Frequency remains appropriate    Precautions / Restrictions Precautions Precautions: Anterior Hip Precaution Booklet Issued: Yes (comment) Precaution Comments: Pt able to recall 3/3 hip precautions.  Restrictions Weight Bearing Restrictions: Yes RLE Weight Bearing: Partial weight bearing RLE Partial Weight Bearing Percentage or Pounds: 50   Pertinent Vitals/Pain no apparent distress     Mobility  Bed Mobility Supine to Sit: 4: Min guard;With rails Details for Bed Mobility Assistance: Pt able to manage RLE independently Transfers Sit to Stand: 6: Modified independent (Device/Increase time);From bed;From chair/3-in-1;With upper extremity assist Stand to Sit: 6: Modified independent (Device/Increase time);To bed;To chair/3-in-1;With upper extremity assist Ambulation/Gait Ambulation/Gait Assistance: 5: Supervision Ambulation Distance (Feet): 120 Feet Assistive device: Rolling walker Ambulation/Gait Assistance Details: Patient demonstrating PWB and good technique Gait Pattern: Step-to pattern Stairs: Yes Stairs Assistance: 4: Min guard Stair Management Technique: Step to pattern;Backwards Number of Stairs: 1 (practiced  twice)    Exercises Total Joint Exercises Quad Sets: 10 reps;Right;AROM Heel Slides: 10 reps;Right;AROM Long Arc Quad:  AROM;Right;10 reps   PT Diagnosis:    PT Problem List:   PT Treatment Interventions:     PT Goals Acute Rehab PT Goals PT Goal: Supine/Side to Sit - Progress: Progressing toward goal PT Transfer Goal: Bed to Chair/Chair to Bed - Progress: Met PT Goal: Ambulate - Progress: Progressing toward goal PT Goal: Up/Down Stairs - Progress: Progressing toward goal Additional Goals PT Goal: Additional Goal #1 - Progress: Met  Visit Information  Last PT Received On: 06/09/12 Assistance Needed: +1    Subjective Data      Cognition  Cognition Overall Cognitive Status: Appears within functional limits for tasks assessed/performed Arousal/Alertness: Awake/alert Orientation Level: Appears intact for tasks assessed Behavior During Session: [facility] for tasks performed    Balance     End of Session PT - End of Session Equipment Utilized During Treatment: Gait belt Activity Tolerance: Patient tolerated treatment well Patient left: with call bell/phone within reach;in chair Nurse Communication: Mobility status   GP     Fredrich Birks 06/09/2012, 12:04 PM 06/09/2012 Fredrich Birks PTA [phone] pager [phone] office

## 2012-06-09 NOTE — Progress Notes (Signed)
Subjective: 2 Days Post-Op Procedure(s) (LRB): TOTAL HIP ARTHROPLASTY ANTERIOR APPROACH (Right) Patient reports pain as mild.  Still with PCA and has not used po meds yet, however not using PCA today Tolerated OOB and 50% PWB status.  Will have PT again today and then discharge.  Objective: Vital signs in last 24 hours: Temp:  [97.7 F (36.5 C)-97.8 F (36.6 C)] 97.8 F (36.6 C) (03/21 0648) Pulse Rate:  [62-81] 81 (03/21 1014) Resp:  [16-24] 16 (03/21 0828) BP: (103-130)/(68-88) 103/81 mmHg (03/21 1014) SpO2:  [96 %-100 %] 99 % (03/21 0648)  Intake/Output from previous day: 03/20 0701 - 03/21 0700 In: 1738.8 [I.V.:1738.8] Out: 1400 [Urine:1400] Intake/Output this shift: Total I/O In: -  Out: 201 [Urine:200; Stool:1]   Recent Labs  06/08/12 0620 06/09/12 0700  HGB 14.0 12.3*    Recent Labs  06/08/12 0620 06/09/12 0700  WBC 7.8 9.5  RBC 4.49 3.96*  HCT 39.9 34.7*  PLT PLATELET CLUMPS NOTED ON SMEAR, UNABLE TO ESTIMATE 171    Recent Labs  06/08/12 0620  NA 135  K 4.7  CL 101  CO2 20  BUN 16  CREATININE 1.02  GLUCOSE 117*  CALCIUM 8.6   No results found for this basename: LABPT, INR,  in the last 72 hours  Neurovascular intact Sensation intact distally Intact pulses distally Dorsiflexion/Plantar flexion intact Incision: no drainage  Assessment/Plan: 2 Days Post-Op Procedure(s) (LRB): TOTAL HIP ARTHROPLASTY ANTERIOR APPROACH (Right) Up with therapy then discharge home.  No need for HHPT/OT Aspirin for VTE prophylaxis. RX percocet and robaxin  OV 2 week with Joycelyn Das 06/09/2012, 10:21 AM

## 2012-06-09 NOTE — Discharge Summary (Signed)
Physician Discharge Summary  Patient ID: Blake Solis MRN: [id number] DOB/AGE: 1955/08/08 57 y.o.  Admit date: 06/07/2012 Discharge date: 06/09/2012  Admission Diagnoses:  Avascular necrosis of right femoral head  Discharge Diagnoses:  Principal Problem:   Avascular necrosis of right femoral head   Past Medical History  Diagnosis Date  . HTN (hypertension)   . Dyslipidemia   . GERD (gastroesophageal reflux disease)   . Overweight   . Dizziness   . OSA (obstructive sleep apnea)   . Bronchitis, allergic   . Arthritis     Surgeries: Procedure(s): RIGHT TOTAL HIP ARTHROPLASTY ANTERIOR APPROACH on 06/07/2012   Consultants (if any):  NONE  Discharged Condition: Improved  Hospital Course: Blake Solis is an 57 y.o. male who was admitted 06/07/2012 with a diagnosis of Avascular necrosis of right femoral head and went to the operating room on 06/07/2012 and underwent the above named procedures.    He was given perioperative antibiotics:  Anti-infectives   Start     Dose/Rate Route Frequency Ordered Stop   06/07/12 0600  ceFAZolin (ANCEF) IVPB 2 g/50 mL premix     2 g 100 mL/hr over 30 Minutes Intravenous On call to O.R. 06/06/12 1439 06/07/12 1254    .  He was given sequential compression devices, early ambulation, and ASPIRIN for DVT prophylaxis.  He benefited maximally from the hospital stay and there were no complications.    Recent vital signs:  Filed Vitals:   06/09/12 1014  BP: 103/81  Pulse: 81  Temp:   Resp:     Recent laboratory studies:  Lab Results  Component Value Date   HGB 12.3* 06/09/2012   HGB 14.0 06/08/2012   HGB 14.5 06/02/2012   Lab Results  Component Value Date   WBC 9.5 06/09/2012   PLT 171 06/09/2012   Lab Results  Component Value Date   INR 1.01 06/02/2012   Lab Results  Component Value Date   NA 135 06/08/2012   K 4.7 06/08/2012   CL 101 06/08/2012   CO2 20 06/08/2012   BUN 16 06/08/2012   CREATININE 1.02 06/08/2012   GLUCOSE  117* 06/08/2012    Discharge Medications:     Medication List    STOP taking these medications       HYDROcodone-acetaminophen 5-325 MG per tablet  Commonly known as:  NORCO/VICODIN      TAKE these medications       amLODipine 5 MG tablet  Commonly known as:  NORVASC  Take 5 mg by mouth every morning.     aspirin 325 MG EC tablet  Take 1 tablet (325 mg total) by mouth daily with breakfast.     lisinopril 40 MG tablet  Commonly known as:  PRINIVIL,ZESTRIL  Take 40 mg by mouth daily.     methocarbamol 500 MG tablet  Commonly known as:  ROBAXIN  Take 1 tablet (500 mg total) by mouth every 6 (six) hours as needed.     oxyCODONE-acetaminophen 5-325 MG per tablet  Commonly known as:  ROXICET  Take 1-2 tablets by mouth every 4 (four) hours as needed for pain.     traMADol 50 MG tablet  Commonly known as:  ULTRAM  Take 50 mg by mouth every 6 (six) hours as needed for pain.        Diagnostic Studies: Dg Chest 2 View  06/02/2012  *RADIOLOGY REPORT*  Clinical Data: Preoperative respiratory exam.  Arthritis of the right hip.  CHEST - 2 VIEW  Comparison: 06/19/2010 and 11/16/2007  Findings: Heart size and pulmonary vascularity are normal and the lungs are clear.  No significant osseous abnormality.  IMPRESSION: Normal chest.   Original Report Authenticated By: Francene Boyers, M.D.    Dg Hip Operative Right  06/07/2012  *RADIOLOGY REPORT*  Clinical Data: Hip replacement.  DG OPERATIVE RIGHT HIP  Comparison: None.  Findings: Single fluoroscopic intraoperative view of the right hip is provided.  Image demonstrates a total hip arthroplasty in place. No fracture or dislocation.  IMPRESSION: Right hip replacement without evidence of complication.   Original Report Authenticated By: Holley Dexter, M.D.     Disposition:       Discharge Orders   Future Orders Complete By Expires     Call MD / Call 911  As directed     Comments:      If you experience chest pain or shortness of  breath, CALL 911 and be transported to the hospital emergency room.  If you develope a fever above 101 F, pus (white drainage) or increased drainage or redness at the wound, or calf pain, call your surgeon's office.    Constipation Prevention  As directed     Comments:      Drink plenty of fluids.  Prune juice may be helpful.  You may use a stool softener, such as Colace (over the counter) 100 mg twice a day.  Use MiraLax (over the counter) for constipation as needed.    Diet - low sodium heart healthy  As directed     Discharge instructions  As directed     Comments:      Keep hip incision dry for 5 days post op then may wet while bathing. Change dressing as needed. Walk daily.  50% partial weight bearing to right leg with walker  Call if fever or chills or increased drainage. Go to ER if acutely short of breath or call for ambulance. Return for follow up in 2 weeks. In house walking for first 2 weeks.  Aspirin 325mg  daily for blood clot prevention. Ice packs to hip as needed for pain    Driving restrictions  As directed     Comments:      No driving    Follow the hip precautions as taught in Physical Therapy  As directed     Comments:      50% partial weight bearing with walker until advised by Dr Ophelia Charter to increase weight bearing.    Increase activity slowly as tolerated  As directed     TED hose  As directed     Comments:      Use stockings (TED hose) for 4 weeks on both leg(s).  You may remove them at night for sleeping.       Follow-up Information   Follow up with Eldred Manges, MD. Schedule an appointment as soon as possible for a visit in 2 weeks.   Contact information:   [address] [place] [address] [place] [address] [phone]        Signed: Wende Neighbors 06/09/2012, 10:33 AM

## 2012-06-09 NOTE — Progress Notes (Signed)
Physical Therapy Treatment Patient Details Name: Blake Solis MRN: [id number] DOB: November 30, 1955 Today's Date: 06/09/2012 Time: 7829-5621 PT Time Calculation (min): 23 min  PT Assessment / Plan / Recommendation Comments on Treatment Session  Pt doing well with mobilty. Will attempt step prior to lunch and then patient eager to return home    Follow Up Recommendations  Home health PT     Does the patient have the potential to tolerate intense rehabilitation     Barriers to Discharge        Equipment Recommendations       Recommendations for Other Services    Frequency 7X/week   Plan Discharge plan remains appropriate;Frequency remains appropriate    Precautions / Restrictions Precautions Precautions: Anterior Hip Precaution Booklet Issued: Yes (comment) Precaution Comments: Pt able to recall 3/3 hip precautions.  Restrictions RLE Weight Bearing: Partial weight bearing RLE Partial Weight Bearing Percentage or Pounds: 50   Pertinent Vitals/Pain     Mobility  Bed Mobility Supine to Sit: 4: Min guard;With rails Details for Bed Mobility Assistance: Pt able to manage RLE independently Transfers Sit to Stand: 6: Modified independent (Device/Increase time);From bed;From chair/3-in-1;With upper extremity assist Stand to Sit: 6: Modified independent (Device/Increase time);To bed;To chair/3-in-1;With upper extremity assist Ambulation/Gait Ambulation/Gait Assistance: 5: Supervision Ambulation Distance (Feet): 100 Feet Assistive device: Rolling walker Ambulation/Gait Assistance Details: Patient demonstrating PWB and good technique Gait Pattern: Step-to pattern    Exercises Total Joint Exercises Quad Sets: 10 reps;Right;AROM Heel Slides: 10 reps;Right;AROM Long Arc Quad: AROM;Right;10 reps   PT Diagnosis:    PT Problem List:   PT Treatment Interventions:     PT Goals Acute Rehab PT Goals PT Goal: Supine/Side to Sit - Progress: Progressing toward goal PT Transfer Goal:  Bed to Chair/Chair to Bed - Progress: Met PT Goal: Ambulate - Progress: Progressing toward goal  Visit Information  Last PT Received On: 06/09/12 Assistance Needed: +1    Subjective Data      Cognition  Cognition Overall Cognitive Status: Appears within functional limits for tasks assessed/performed Arousal/Alertness: Awake/alert Orientation Level: Appears intact for tasks assessed Behavior During Session: [facility] for tasks performed    Balance     End of Session PT - End of Session Equipment Utilized During Treatment: Gait belt Activity Tolerance: Patient tolerated treatment well Patient left: with call bell/phone within reach;in chair Nurse Communication: Mobility status   GP     Fredrich Birks 06/09/2012, 9:45 AM 06/09/2012 Fredrich Birks PTA [phone] pager [phone] office

## 2012-06-10 NOTE — Progress Notes (Signed)
CARE MANAGEMENT NOTE 06/10/2012  Patient:  Blake Solis, Blake Solis   Account Number:  [id number]  Date Initiated:  06/08/2012  Documentation initiated by:  Vance Peper  Subjective/Objective Assessment:   patient had right total hip arthroplasty.     Action/Plan:   No home health needs identified. DME has been supplied by friends.   Anticipated DC Date:  06/09/2012   Anticipated DC Plan:  HOME/SELF CARE      DC Planning Services  CM consult      Choice offered to / List presented to:             Status of service:  Completed, signed off Medicare Important Message given?   (If response is "NO", the following Medicare IM given date fields will be blank) Date Medicare IM given:   Date Additional Medicare IM given:    Discharge Disposition:  HOME/SELF CARE  Per UR Regulation:    If discussed at Long Length of Stay Meetings, dates discussed:    Comments:

## 2013-01-25 ENCOUNTER — Other Ambulatory Visit: Payer: Self-pay

## 2014-01-14 IMAGING — RF DG HIP OPERATIVE*R*
1 series · 4 of 4 positions shown · non-contrast
Comparison: None.

CLINICAL DATA: Hip replacement.

DG OPERATIVE RIGHT HIP

[Series 1: run · 4 of 4 slices shown]
[im 1/4]
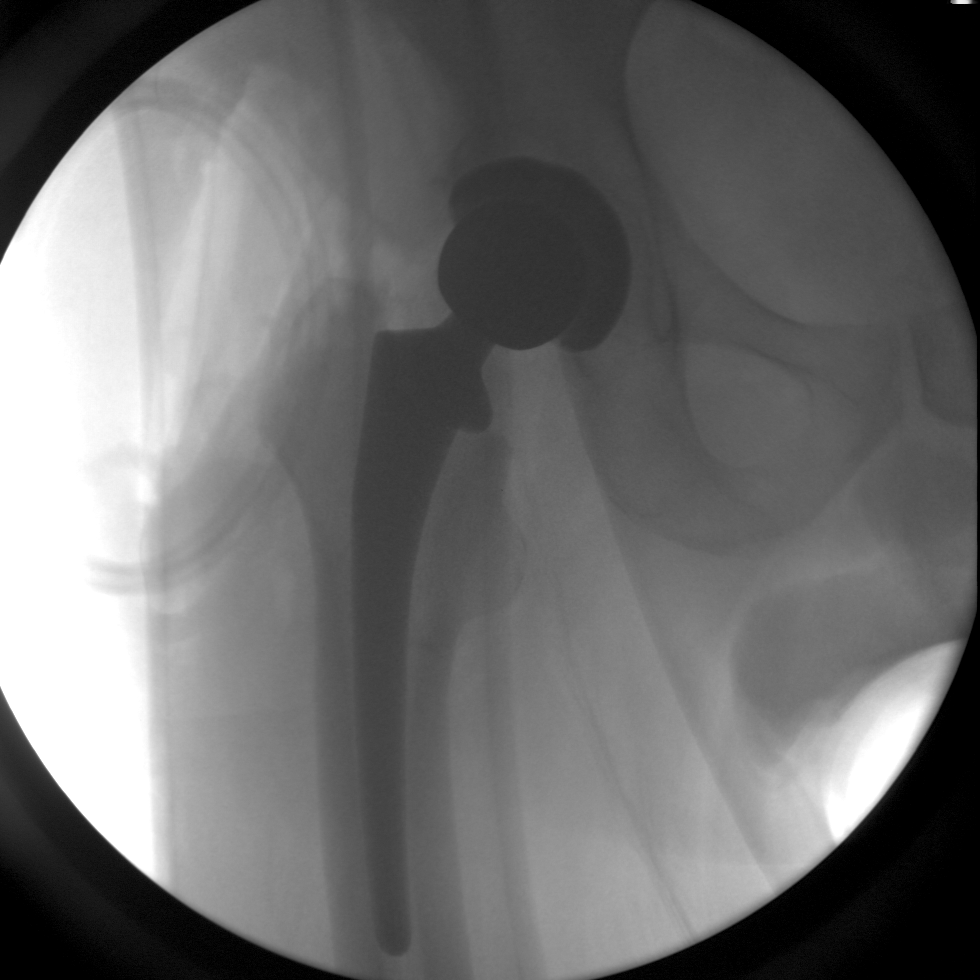
[im 2/4]
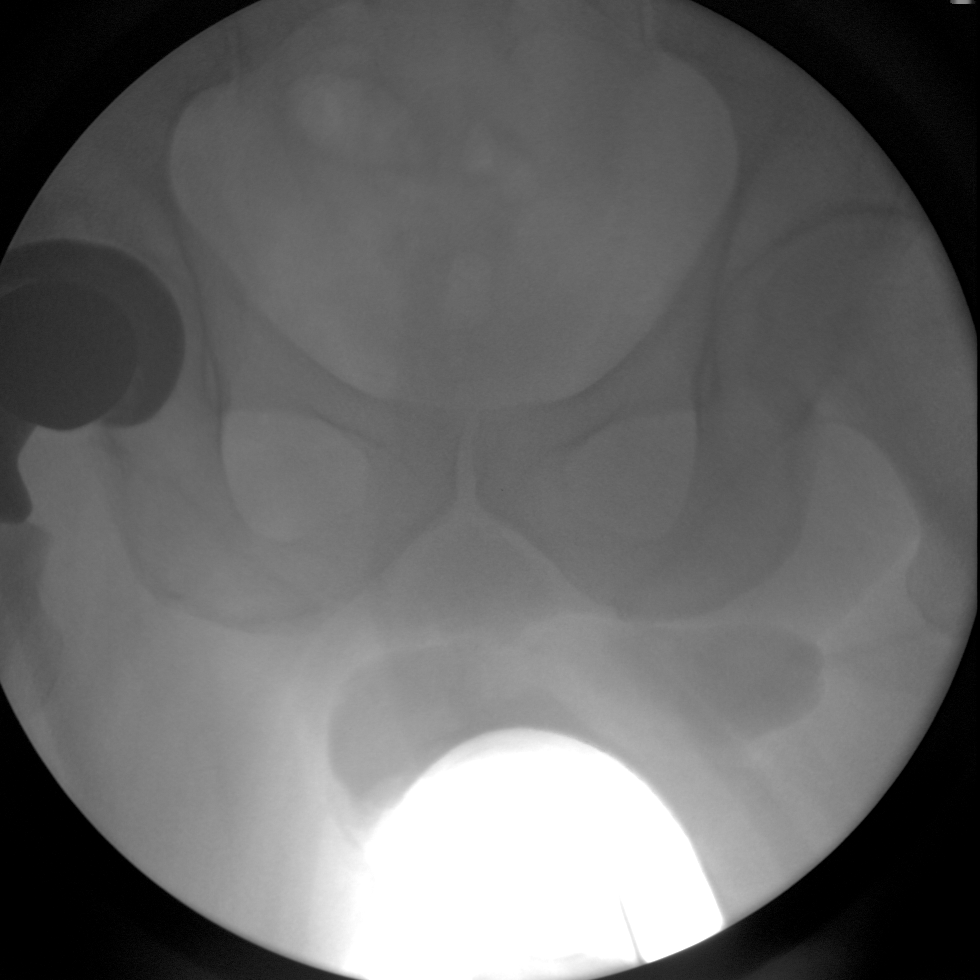
[im 3/4]
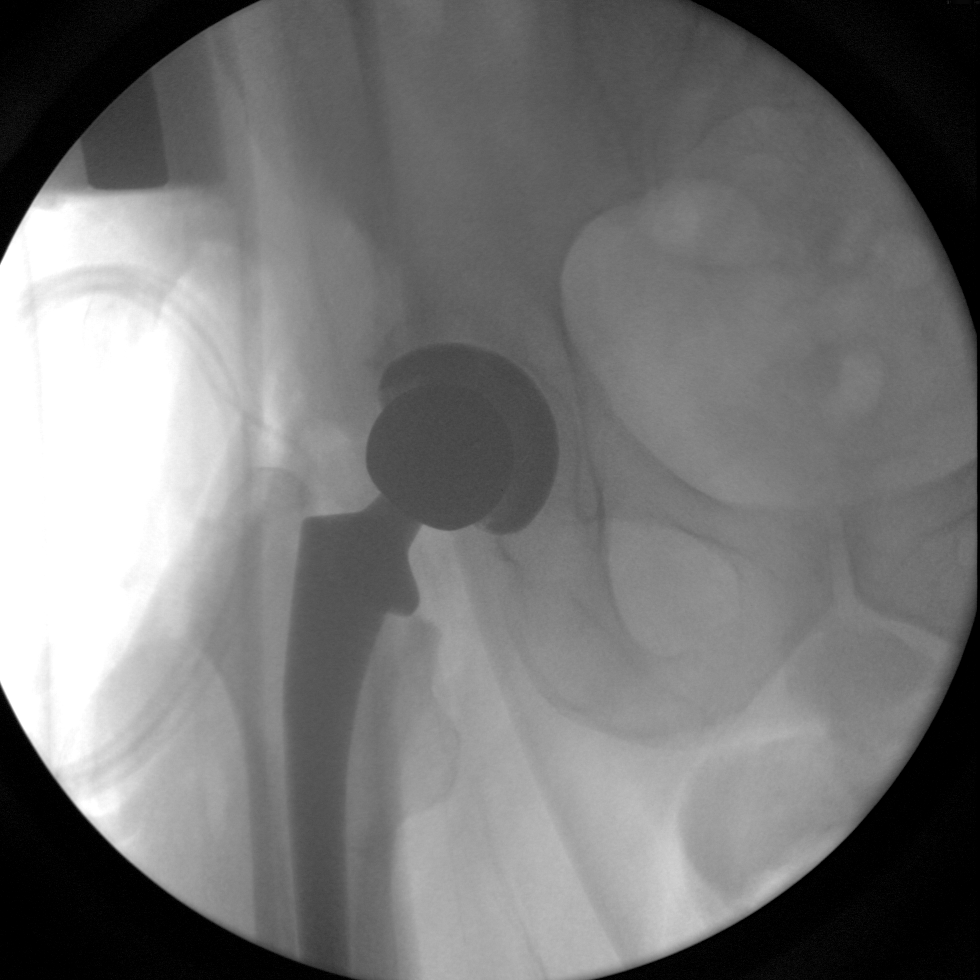
[im 4/4]
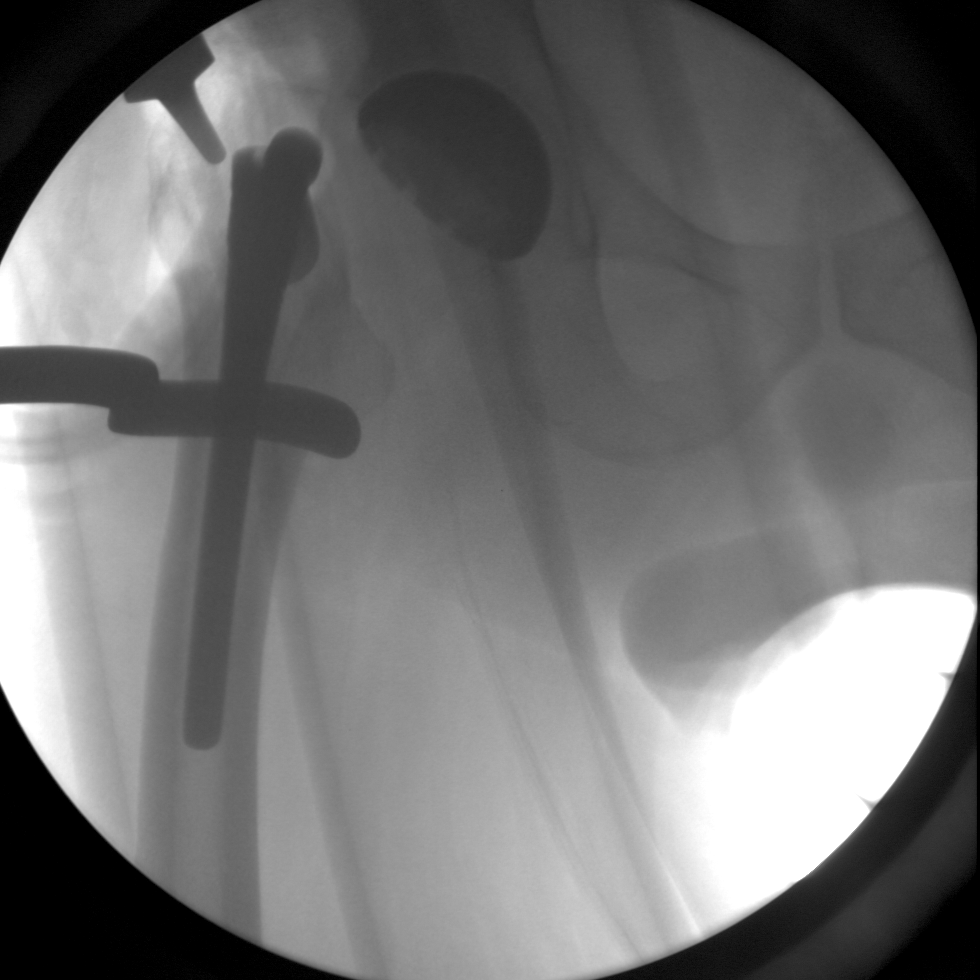

[4 of 4 positions shown; findings below may reference images not displayed]

FINDINGS: Single fluoroscopic intraoperative view of the right hip
is provided.  Image demonstrates a total hip arthroplasty in place.
No fracture or dislocation.
IMPRESSION: Right hip replacement without evidence of complication.

## 2015-01-03 ENCOUNTER — Emergency Department (HOSPITAL_COMMUNITY)
Admission: EM | Admit: 2015-01-03 | Discharge: 2015-01-03 | Disposition: A | Discharge status: Home / Self Care | Payer: BLUE CROSS/BLUE SHIELD | Attending: Emergency Medicine | Admitting: Emergency Medicine

## 2015-01-03 ENCOUNTER — Encounter (HOSPITAL_COMMUNITY): Payer: Self-pay | Admitting: Emergency Medicine

## 2015-01-03 DIAGNOSIS — I10 Essential (primary) hypertension: Secondary | ICD-10-CM | POA: Insufficient documentation

## 2015-01-03 DIAGNOSIS — Z79899 Other long term (current) drug therapy: Secondary | ICD-10-CM | POA: Diagnosis not present

## 2015-01-03 DIAGNOSIS — Z7982 Long term (current) use of aspirin: Secondary | ICD-10-CM | POA: Diagnosis not present

## 2015-01-03 DIAGNOSIS — Z87891 Personal history of nicotine dependence: Secondary | ICD-10-CM | POA: Diagnosis not present

## 2015-01-03 DIAGNOSIS — K0889 Other specified disorders of teeth and supporting structures: Secondary | ICD-10-CM | POA: Diagnosis not present

## 2015-01-03 DIAGNOSIS — Z972 Presence of dental prosthetic device (complete) (partial): Secondary | ICD-10-CM

## 2015-01-03 HISTORY — DX: Essential (primary) hypertension: I10

## 2015-01-03 MED ORDER — BUPIVACAINE-EPINEPHRINE (PF) 0.5% -1:200000 IJ SOLN
1.8000 mL | Freq: Once | INTRAMUSCULAR | Status: AC
  Administered 2015-01-03: 1.8 mL
  Filled 2015-01-03: qty 1.8

## 2015-01-03 MED ORDER — PENICILLIN V POTASSIUM 250 MG PO TABS: 250.0000 mg | ORAL_TABLET | Freq: Four times a day (QID) | ORAL | Status: AC

## 2015-01-03 MED ORDER — HYDROCODONE-ACETAMINOPHEN 5-325 MG PO TABS: 1.0000 | ORAL_TABLET | Freq: Four times a day (QID) | ORAL | Status: DC | PRN

## 2015-01-03 NOTE — Discharge Instructions (Signed)

## 2015-01-03 NOTE — ED Notes (Signed)
Patient here with complaint of anterior dental pain. States he has a bridge in the area which has come loose and is causing significant pain.

## 2015-01-03 NOTE — ED Provider Notes (Signed)
CSN: [id number]     Arrival date & time 01/03/15  2138 History  By signing my name below, I, Soijett Blue, attest that this documentation has been prepared under the direction and in the presence of Montine Circle, PA-C Electronically Signed: Soijett Blue, ED Scribe. 01/03/2015. 10:08 PM.   Chief Complaint  Patient presents with  . Dental Pain      The history is provided by the patient. No language interpreter was used.    Blake Solis is a 59 y.o. male with a medical hx of HTN who presents to the Emergency Department complaining of moderate dental pain onset today. He notes that he has a bridge to the area that has come loose and hanging down. He denies any injury/trauma to his mouth, he noticed it when he was eating breakfast this morning when he noticed the pain. He notes that the last dentist that he saw was Dr. Abigail Miyamoto and he tried to make appointments with various dentists today who informed him that he should come in Tuesday to be seen. He states that has not tried any medications for the relief of his symptoms. He denies gum swelling/bleeding, fever, and any other symptoms.    Past Medical History  Diagnosis Date  . Hypertension    Past Surgical History  Procedure Laterality Date  . Hip arthroplasty Right   . Kidney donation Left   . Elbow surgery Left    No family history on file. Social History  Substance Use Topics  . Smoking status: Former Smoker -- 1.00 packs/day for 40 years    Types: Cigarettes    Quit date: 04/22/1998  . Smokeless tobacco: None  . Alcohol Use: No    Review of Systems  Constitutional: Negative for chills.  HENT: Positive for dental problem. Negative for ear pain, rhinorrhea, sore throat and trouble swallowing.   Eyes: Negative for pain.  Gastrointestinal: Negative for nausea, vomiting and abdominal pain.  Allergic/Immunologic: Negative for immunocompromised state.  Hematological: Does not bruise/bleed easily.      Allergies   Meloxicam  Home Medications   Prior to Admission medications   Medication Sig Start Date End Date Taking? Authorizing Provider  aspirin 81 MG EC tablet Take 81 mg by mouth daily.      Historical Provider, MD  Cholecalciferol (VITAMIN D) 2000 UNITS CAPS Take by mouth. One daily      Historical Provider, MD  fenofibrate micronized (ANTARA) 130 MG capsule Take 130 mg by mouth daily before breakfast.      Historical Provider, MD  lisinopril (PRINIVIL,ZESTRIL) 20 MG tablet Take 20 mg by mouth daily. 2 daily     Historical Provider, MD   BP 165/82 mmHg  Pulse 54  Temp(Src) 98.2 F (36.8 C) (Oral)  Resp 16  Ht 5\' 10"  (1.778 m)  Wt 233 lb (105.688 kg)  BMI 33.43 kg/m2  SpO2 99% Physical Exam  Constitutional: He is oriented to person, place, and time. He appears well-developed and well-nourished. No distress.  HENT:  Head: Normocephalic and atraumatic.  Entire upper dental bridge is barely attached, extremely loose, no evidence of abscess, infection  Eyes: EOM are normal.  Neck: Neck supple.  Cardiovascular: Normal rate.   Pulmonary/Chest: Effort normal. No respiratory distress.  Musculoskeletal: Normal range of motion.  Neurological: He is alert and oriented to person, place, and time.  Skin: Skin is warm and dry.  Psychiatric: He has a normal mood and affect. His behavior is normal.  Nursing note and  vitals reviewed.   ED Course  Dental Date/Time: 01/03/2015 10:45 PM Performed by: Montine Circle Authorized by: Montine Circle Consent: Verbal consent obtained. Risks and benefits: risks, benefits and alternatives were discussed Consent given by: patient Patient understanding: patient states understanding of the procedure being performed Patient consent: the patient's understanding of the procedure matches consent given Procedure consent: procedure consent matches procedure scheduled Relevant documents: relevant documents present and verified Test results: test results  available and properly labeled Site marked: the operative site was marked Imaging studies: imaging studies available Required items: required blood products, implants, devices, and special equipment available Patient identity confirmed: verbally with patient Time out: Immediately prior to procedure a "time out" was called to verify the correct patient, procedure, equipment, support staff and site/side marked as required. Preparation: Patient was prepped and draped in the usual sterile fashion. Local anesthesia used: yes Local anesthetic: bupivacaine 0.25% with epinephrine Anesthetic total: 1.8 ml Patient sedated: no Patient tolerance: Patient tolerated the procedure well with no immediate complications   (including critical care time) DIAGNOSTIC STUDIES: Oxygen Saturation is 99% on RA, nl by my interpretation.    COORDINATION OF CARE: 10:14 PM Discussed treatment plan with pt at bedside which includes consult with attending, and pt agreed to plan.      MDM   Final diagnoses:  Presence of dental bridge  Pain, dental    Patient seen by and discussed with Dr. Wyvonnia Dusky, who agrees with plan for removal of bridge.  Will perform dental block and attempt removal.  After patient was adequately anesthetized, myself and the patient were testing the anesthesia, and the bridge fell out. No forceful removal was used. Patient will need to follow-up with his dentist. I will prescribe him some pain medicine and antibiotic.  I, Makayle Krahn, personally performed the services described in this documentation. All medical record entries made by the scribe were at my direction and in my presence.  I have reviewed the chart and discharge instructions and agree that the record reflects my personal performance and is accurate and complete. Andraya Frigon.  01/03/2015. 10:46 PM.      Montine Circle, PA-C 01/03/15 [place], MD 01/04/15 6269

## 2015-01-06 ENCOUNTER — Encounter (HOSPITAL_COMMUNITY): Payer: Self-pay | Admitting: General Practice

## 2015-04-24 ENCOUNTER — Other Ambulatory Visit (HOSPITAL_COMMUNITY): Payer: Self-pay | Admitting: Orthopaedic Surgery

## 2015-04-24 DIAGNOSIS — M2391 Unspecified internal derangement of right knee: Secondary | ICD-10-CM

## 2015-04-24 DIAGNOSIS — M25561 Pain in right knee: Secondary | ICD-10-CM

## 2015-05-08 ENCOUNTER — Other Ambulatory Visit (HOSPITAL_COMMUNITY): Payer: Self-pay

## 2015-05-09 ENCOUNTER — Other Ambulatory Visit (HOSPITAL_COMMUNITY): Payer: Self-pay

## 2016-06-10 ENCOUNTER — Ambulatory Visit (INDEPENDENT_AMBULATORY_CARE_PROVIDER_SITE_OTHER): Payer: BLUE CROSS/BLUE SHIELD | Admitting: Orthopaedic Surgery

## 2016-06-10 ENCOUNTER — Encounter (INDEPENDENT_AMBULATORY_CARE_PROVIDER_SITE_OTHER): Payer: Self-pay | Admitting: Orthopaedic Surgery

## 2016-06-10 VITALS — BP 139/82 | HR 64 | Ht 70.0 in | Wt 246.0 lb

## 2016-06-10 DIAGNOSIS — M94261 Chondromalacia, right knee: Secondary | ICD-10-CM

## 2016-06-10 MED ORDER — DIAZEPAM 5 MG PO TABS: ORAL_TABLET | ORAL | 0 refills | Status: DC

## 2016-06-10 NOTE — Progress Notes (Addendum)
Office Visit Note   Patient: Blake Solis           Date of Birth: 1955-10-15           MRN: [id number] Visit Date: 06/10/2016              Requested by: Blake Katz, NP [place], [place] [address] PCP: Blake Katz, NP   Assessment & Plan: Visit Diagnoses:  1. Chondromalacia, right knee     Plan: We'll proceed with an MRI scan to evaluate him for a chondral flap tear with persistent catching in his knee. 4 years ago he had partial-thickness chondral wear grade 3 changes. If MRI shows areas of grade 4 changes then total knee arthroplasty likely would be needed. He's had anti-inflammatories multiple cortisone injections and last injection did not give her relief is a previously has. He is symptomatic at work and is concerned is not committable continue working which he needs to for several more years. MRI right knee ordered.  Follow-Up Instructions: Return in about 1 week (around 06/17/2016).   Orders:  Orders Placed This Encounter  Procedures  . MR Knee Right w/o contrast   Meds ordered this encounter  Medications  . diazepam (VALIUM) 5 MG tablet    Sig: Take one tablet one hour prior to procedure. May repeat if needed. Must have driver.    Dispense:  2 tablet    Refill:  0      Procedures: No procedures performed   Clinical Data: No additional findings.   Subjective: Chief Complaint  Patient presents with  . Right Knee - Pain    Patient presents with right knee pain. He was in the [place] office and was evaluated by Dr. Lynann Solis on 05/25/2016.  He has had a year of intermittent knee pain. It is now locking and swelling. He had an injection in his PCP office that only lasted two days. He did take prednisone which helped, but only while he took it. Dr. Lynann Solis recommended Euflexxa and it has been approved. The patient has previously been treated by Dr. Lorin Solis and would like his opinion on Visco supplementation.   Patient has 2 sisters who had been $1500 from  the [facility] a program including injections and physical therapy. They did not get relief. One is considering total knee arthroplasty. He's done some reading on his own and feels that is not likely to help him. Previous knee arthroscopy findings were reviewed which showed grade 3 changes patellofemoral joint no areas of grade 4 changes. He is on his feet working at a mill and states at this point he is going to have to have something done due to increasing pain. Previous x-rays show patellofemoral spurring and some calcific tendinopathy at the inferior pole of the patella and also at the patellar tendon insertion site at the tibial tubercle.  Review of Systems 14 point positive for previous right total hip arthroplasty for AVN. Positive for hypertension hyperlipidemia prostate problems in the past hydrocele and knee chondromalacia worse on the right than left. 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 ROS  otherwise negative.   Objective: Vital Signs: BP 139/82   Pulse 64   Ht 5\' 10"  (1.778 m)   Wt 246 lb (111.6 kg)   BMI 35.30 kg/m   Physical Exam  Constitutional: He is oriented to person, place, and time. He appears well-developed and well-nourished.  HENT:  Head: Normocephalic and atraumatic.  Eyes: EOM are normal. Pupils are  equal, round, and reactive to light.  Neck: No tracheal deviation present. No thyromegaly present.  Cardiovascular: Normal rate.   Pulmonary/Chest: Effort normal. He has no wheezes.  Abdominal: Soft. Bowel sounds are normal.  Musculoskeletal:  Negative straight leg raising. Patellofemoral crepitus is noted. Negative lateral subluxation. No pain with hip range of motion. Reflexes are 2+. Palpation of the patellar tendon shows no areas of significant tenderness. Trace knee effusion on the right none on the left. Reflexes are 2+ distal pulses are intact. He has pain with stairs. Positive patellar test with crepitus and discomfort. Distal pulses are intact.  Neurological:  He is alert and oriented to person, place, and time.  Skin: Skin is warm and dry. Capillary refill takes less than 2 seconds.  Psychiatric: He has a normal mood and affect. His behavior is normal. Judgment and thought content normal.   healed hip incision up and with hip range of motion. No anterior groin tenderness no pain with range of motion of his hip  Ortho Exam  Specialty Comments:  No specialty comments available.  Imaging: No results found.   PMFS History: Patient Active Problem List   Diagnosis Date Noted  . Avascular necrosis of right femoral head ([place]) 06/07/2012    Class: Diagnosis of  . HTN (hypertension) 07/08/2010  . Hyperlipemia 07/08/2010  . Prostatitis 07/08/2010  . Degenerative disc disease 07/08/2010  . Hydrocele of testis 07/08/2010  . DYSLIPIDEMIA 09/08/2009  . OVERWEIGHT 09/08/2009  . HYPERTENSION 09/08/2009  . DIZZINESS 09/08/2009   Past Medical History:  Diagnosis Date  . Arthritis   . Bronchitis, allergic   . Dizziness   . Dyslipidemia   . GERD (gastroesophageal reflux disease)   . HTN (hypertension)   . Hypertension   . OSA (obstructive sleep apnea)   . Overweight(278.02)     No family history on file.  Past Surgical History:  Procedure Laterality Date  . CARDIAC CATHETERIZATION    . CYST EXCISION Right    cyst removed from arm  . ELBOW SURGERY Left   . HIP ARTHROPLASTY Right   . KIDNEY DONATION Left   . KNEE ARTHROSCOPY  2013  . lft ulnar nerve removed     decompression  . 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  . TOTAL HIP ARTHROPLASTY Right 06/08/2012  . TOTAL HIP ARTHROPLASTY Right 06/07/2012   Procedure: TOTAL HIP ARTHROPLASTY ANTERIOR APPROACH;  Surgeon: Blake Killings, MD;  Location: [place];  Service: Orthopedics;  Laterality: Right;  Right Total Hip Arthroplasty-Anterior Approach   Social History   Occupational History  . Not on file.   Social History Main Topics  . Smoking status: Former Smoker     Packs/day: 1.00    Years: 40.00    Types: Cigarettes    Quit date: 04/22/1998  . Smokeless tobacco: Never Used  . Alcohol use No  . Drug use: No  . Sexual activity: Not on file

## 2016-06-17 ENCOUNTER — Ambulatory Visit (INDEPENDENT_AMBULATORY_CARE_PROVIDER_SITE_OTHER): Payer: BLUE CROSS/BLUE SHIELD | Admitting: Orthopaedic Surgery

## 2016-06-17 ENCOUNTER — Encounter (INDEPENDENT_AMBULATORY_CARE_PROVIDER_SITE_OTHER): Payer: Self-pay | Admitting: Orthopaedic Surgery

## 2016-06-17 VITALS — BP 135/93 | HR 68 | Ht 70.0 in | Wt 246.0 lb

## 2016-06-17 DIAGNOSIS — M2241 Chondromalacia patellae, right knee: Secondary | ICD-10-CM | POA: Diagnosis not present

## 2016-06-17 NOTE — Progress Notes (Signed)
Office Visit Note   Patient: Blake Solis           Date of Birth: [birth year]           MRN: [id number] Visit Date: 06/17/2016              Requested by: Arsenio Katz, NP [place], [place] [address] PCP: Arsenio Katz, NP   Assessment & Plan: Visit Diagnoses:  1. Chondromalacia patellae, right knee     Plan: Patient use an exercise bike he can use some ice intermittently. He'll try to work on dieting to lose a little weight to help unload his knee. We discussed occasional anti-inflammatory usage. We reviewed the MRI scan images I gave him a copy of the report. I'll check him back again  in a month Follow-Up Instructions: Return in about 1 month (around 07/18/2016).   Orders:  No orders of the defined types were placed in this encounter.  No orders of the defined types were placed in this encounter.     Procedures: No procedures performed   Clinical Data: No additional findings.   Subjective: Chief Complaint  Patient presents with  . Right Knee - Pain    Patient returns to review MRI of the right knee. He states that there have been no changes since his last visit.  Knee bothers him particularly after he sits for. Time he has some stiffness. He is ambulatory with a slight limp. Some problem with stairs. No fever chills no history of gout.  Review of Systems 14 4 views systems updated and is unchanged from last visit. Patient had a cortisone injection one month ago in his knee. Objective: Vital Signs: BP (!) 135/93   Pulse 68   Ht 5\' 10"  (1.778 m)   Wt 246 lb (111.6 kg)   BMI 35.30 kg/m   Physical Exam  Constitutional: He is oriented to person, place, and time. He appears well-developed and well-nourished.  HENT:  Head: Normocephalic and atraumatic.  Eyes: EOM are normal. Pupils are equal, round, and reactive to light.  Neck: No tracheal deviation present. No thyromegaly present.  Cardiovascular: Normal rate.   Pulmonary/Chest: Effort normal. He  has no wheezes.  Abdominal: Soft. Bowel sounds are normal.  Musculoskeletal:  Small popliteal Baker's cyst collateral cruciate ligament exam is normal no knee effusion suprapatellar pouch crepitus with flexion-extension he is immature with the limp. No pain with hip range of motion. Distal pulses are intact anterior tib gastrocsoleus are strong. No trochanteric bursal tenderness.  Neurological: He is alert and oriented to person, place, and time.  Skin: Skin is warm and dry. Capillary refill takes less than 2 seconds.  Psychiatric: He has a normal mood and affect. His behavior is normal. Judgment and thought content normal.    Ortho Exam  Specialty Comments:  No specialty comments available.  Imaging: MRI results from 06/15/2016 shows intact meniscus medial lateral. He has some patellofemoral degenerative changes. He said slight increase in size of the small Baker's cyst versus previous MRI scan February 2017 and also March 2013. No evidence of fracture. Anterior cruciate ligament PCL is normal.   PMFS History: Patient Active Problem List   Diagnosis Date Noted  . Avascular necrosis of right femoral head ([facility]) 06/07/2012    Class: Diagnosis of  . HTN (hypertension) 07/08/2010  . Hyperlipemia 07/08/2010  . Prostatitis 07/08/2010  . Degenerative disc disease 07/08/2010  . Hydrocele of testis 07/08/2010  . DYSLIPIDEMIA 09/08/2009  . OVERWEIGHT  09/08/2009  . HYPERTENSION 09/08/2009  . DIZZINESS 09/08/2009   Past Medical History:  Diagnosis Date  . Arthritis   . Bronchitis, allergic   . Dizziness   . Dyslipidemia   . GERD (gastroesophageal reflux disease)   . HTN (hypertension)   . Hypertension   . OSA (obstructive sleep apnea)   . Overweight(278.02)     No family history on file.  Past Surgical History:  Procedure Laterality Date  . CARDIAC CATHETERIZATION    . CYST EXCISION Right    cyst removed from arm  . ELBOW SURGERY Left   . HIP ARTHROPLASTY Right   . KIDNEY  DONATION Left   . KNEE ARTHROSCOPY  2013  . lft ulnar nerve removed     decompression  . 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  . TOTAL HIP ARTHROPLASTY Right 06/08/2012  . TOTAL HIP ARTHROPLASTY Right 06/07/2012   Procedure: TOTAL HIP ARTHROPLASTY ANTERIOR APPROACH;  Surgeon: Marybelle Killings, MD;  Location: [facility];  Service: Orthopedics;  Laterality: Right;  Right Total Hip Arthroplasty-Anterior Approach   Social History   Occupational History  . Not on file.   Social History Main Topics  . Smoking status: Former Smoker    Packs/day: 1.00    Years: 40.00    Types: Cigarettes    Quit date: 04/22/1998  . Smokeless tobacco: Never Used  . Alcohol use No  . Drug use: No  . Sexual activity: Not on file

## 2016-07-15 ENCOUNTER — Ambulatory Visit (INDEPENDENT_AMBULATORY_CARE_PROVIDER_SITE_OTHER): Payer: Self-pay | Admitting: Orthopaedic Surgery

## 2016-09-02 ENCOUNTER — Emergency Department (HOSPITAL_COMMUNITY)
Admission: EM | Admit: 2016-09-02 | Discharge: 2016-09-02 | Disposition: A | Discharge status: Home / Self Care | Payer: BLUE CROSS/BLUE SHIELD | Attending: Emergency Medicine | Admitting: Emergency Medicine

## 2016-09-02 ENCOUNTER — Emergency Department (HOSPITAL_COMMUNITY): Payer: BLUE CROSS/BLUE SHIELD

## 2016-09-02 ENCOUNTER — Encounter (HOSPITAL_COMMUNITY): Payer: Self-pay | Admitting: Emergency Medicine

## 2016-09-02 DIAGNOSIS — I10 Essential (primary) hypertension: Secondary | ICD-10-CM | POA: Diagnosis not present

## 2016-09-02 DIAGNOSIS — Z79899 Other long term (current) drug therapy: Secondary | ICD-10-CM | POA: Diagnosis not present

## 2016-09-02 DIAGNOSIS — R251 Tremor, unspecified: Secondary | ICD-10-CM | POA: Diagnosis not present

## 2016-09-02 DIAGNOSIS — Z7982 Long term (current) use of aspirin: Secondary | ICD-10-CM | POA: Diagnosis not present

## 2016-09-02 DIAGNOSIS — R079 Chest pain, unspecified: Secondary | ICD-10-CM | POA: Diagnosis present

## 2016-09-02 DIAGNOSIS — R5082 Postprocedural fever: Secondary | ICD-10-CM | POA: Diagnosis not present

## 2016-09-02 DIAGNOSIS — R05 Cough: Secondary | ICD-10-CM | POA: Insufficient documentation

## 2016-09-02 DIAGNOSIS — Z87891 Personal history of nicotine dependence: Secondary | ICD-10-CM | POA: Insufficient documentation

## 2016-09-02 LAB — BASIC METABOLIC PANEL
Anion gap: 13 (ref 5–15)
BUN: 15 mg/dL (ref 6–20)
CO2: 24 mmol/L (ref 22–32)
Calcium: 8.9 mg/dL (ref 8.9–10.3)
Chloride: 99 mmol/L — ABNORMAL LOW (ref 101–111)
Creatinine, Ser: 1.26 mg/dL — ABNORMAL HIGH (ref 0.61–1.24)
GFR calc Af Amer: >60 mL/min (ref >60)
GFR, EST NON AFRICAN AMERICAN: 60 mL/min — AB (ref >60)
Glucose, Bld: 99 mg/dL (ref 65–99)
POTASSIUM: 4 mmol/L (ref 3.5–5.1)
Sodium: 136 mmol/L (ref 135–145)

## 2016-09-02 LAB — CBC
HEMATOCRIT: 45 % (ref 39.0–52.0)
Hemoglobin: 15.3 g/dL (ref 13.0–17.0)
MCH: 30.2 pg (ref 26.0–34.0)
MCHC: 34 g/dL (ref 30.0–36.0)
MCV: 88.9 fL (ref 78.0–100.0)
Platelets: 136 10*3/uL — ABNORMAL LOW (ref 150–400)
RBC: 5.06 MIL/uL (ref 4.22–5.81)
RDW: 13 % (ref 11.5–15.5)
WBC: 6.2 10*3/uL (ref 4.0–10.5)

## 2016-09-02 LAB — I-STAT CG4 LACTIC ACID, ED
Lactic Acid, Venous: 3.43 mmol/L (ref 0.5–1.9)
Lactic Acid, Venous: 0.45 mmol/L — ABNORMAL LOW (ref 0.5–1.9)

## 2016-09-02 LAB — I-STAT TROPONIN, ED: Troponin i, poc: 0 ng/mL (ref 0.00–0.08)

## 2016-09-02 MED ORDER — SODIUM CHLORIDE 0.9 % IV SOLN
3.0000 g | Freq: Once | INTRAVENOUS | Status: AC
  Administered 2016-09-02: 3 g via INTRAVENOUS
  Filled 2016-09-02: qty 3

## 2016-09-02 MED ORDER — ACETAMINOPHEN 500 MG PO TABS
1000.0000 mg | ORAL_TABLET | Freq: Once | ORAL | Status: AC
  Administered 2016-09-02: 1000 mg via ORAL
  Filled 2016-09-02: qty 2

## 2016-09-02 MED ORDER — SODIUM CHLORIDE 0.9 % IV BOLUS (SEPSIS)
1000.0000 mL | Freq: Once | INTRAVENOUS | Status: AC
  Administered 2016-09-02: 1000 mL via INTRAVENOUS

## 2016-09-02 NOTE — ED Notes (Signed)
To x-ray

## 2016-09-02 NOTE — ED Notes (Signed)
EKG given to Dr Pickering 

## 2016-09-02 NOTE — Discharge Instructions (Addendum)
Your fever is felt to be secondary to your dental infection. Please take antibiotics that were prescribed by your dentist. Please return for worsening symptoms, including recurrent fevers, shakes, confusion, or any other symptoms concerning to you.

## 2016-09-02 NOTE — ED Provider Notes (Signed)
[facility] DEPT Provider Note   CSN: [id number] Arrival date & time: 09/02/16  1351     History   Chief Complaint Chief Complaint  Patient presents with  . Chest Pain    HPI Blake Solis is a 61 y.o. male.  HPI Patient presents with chills and right ears. Had a tooth pulled around an hour ago. States that it was infected but it pulled anyway. He was at [facility] to get antibiotics when he started to have shaking. Has chills and began shaking all over. Slight chest pain. Some swelling to the face. No headache. No confusion. States he just feels bad. No nausea or vomiting. No diarrhea.   Past Medical History:  Diagnosis Date  . Arthritis   . Bronchitis, allergic   . Dizziness   . Dyslipidemia   . GERD (gastroesophageal reflux disease)   . HTN (hypertension)   . Hypertension   . OSA (obstructive sleep apnea)   . Overweight(278.02)     Patient Active Problem List   Diagnosis Date Noted  . Avascular necrosis of right femoral head ([place]) 06/07/2012    Class: Diagnosis of  . HTN (hypertension) 07/08/2010  . Hyperlipemia 07/08/2010  . Prostatitis 07/08/2010  . Degenerative disc disease 07/08/2010  . Hydrocele of testis 07/08/2010  . DYSLIPIDEMIA 09/08/2009  . OVERWEIGHT 09/08/2009  . HYPERTENSION 09/08/2009  . DIZZINESS 09/08/2009    Past Surgical History:  Procedure Laterality Date  . CARDIAC CATHETERIZATION    . CYST EXCISION Right    cyst removed from arm  . ELBOW SURGERY Left   . HIP ARTHROPLASTY Right   . KIDNEY DONATION Left   . KNEE ARTHROSCOPY  2013  . lft ulnar nerve removed     decompression  . 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  . TOTAL HIP ARTHROPLASTY Right 06/08/2012  . TOTAL HIP ARTHROPLASTY Right 06/07/2012   Procedure: TOTAL HIP ARTHROPLASTY ANTERIOR APPROACH;  Surgeon: Marybelle Killings, MD;  Location: [facility];  Service: Orthopedics;  Laterality: Right;  Right Total Hip Arthroplasty-Anterior Approach       Home  Medications    Prior to Admission medications   Medication Sig Start Date End Date Taking? Authorizing Provider  amLODipine (NORVASC) 5 MG tablet Take 5 mg by mouth every morning.   Yes [provider]  aspirin 81 MG EC tablet Take 81 mg by mouth daily.     Yes [provider]  Cholecalciferol (VITAMIN D) 2000 UNITS CAPS Take by mouth. One daily     Yes [provider]  lisinopril (PRINIVIL,ZESTRIL) 20 MG tablet Take 20 mg by mouth 2 (two) times daily. 2 daily    Yes [provider]  methocarbamol (ROBAXIN) 500 MG tablet Take 1 tablet (500 mg total) by mouth every 6 (six) hours as needed. 06/09/12  Yes Phillips Hay, PA-C  traMADol (ULTRAM) 50 MG tablet Take 50 mg by mouth every 6 (six) hours as needed for pain.    [provider]    Family History History reviewed. No pertinent family history.  Social History Social History  Substance Use Topics  . Smoking status: Former Smoker    Packs/day: 1.00    Years: 40.00    Types: Cigarettes    Quit date: 04/22/1998  . Smokeless tobacco: Never Used  . Alcohol use No     Allergies   Meloxicam   Review of Systems Review of Systems  Constitutional: Positive for chills and fever. Negative  for appetite change.  HENT: Negative for congestion.   Respiratory: Positive for cough. Negative for shortness of breath.   Cardiovascular: Negative for chest pain.  Gastrointestinal: Negative for abdominal pain.  Genitourinary: Negative for flank pain.  Musculoskeletal: Negative for back pain.  Neurological: Positive for tremors.  Psychiatric/Behavioral: Negative for confusion.     Physical Exam Updated Vital Signs BP 132/80   Pulse (!) 120   Temp (!) 102 F (38.9 C) (Rectal)   Resp 20   Ht 5\' 10"  (1.778 m)   Wt 109.3 kg (241 lb)   SpO2 94%   BMI 34.58 kg/m   Physical Exam  Constitutional: He appears well-developed.  HENT:  Right upper teeth post extraction. No fluctuance. No erythema.    Eyes: EOM are normal.  Neck: Neck supple.  Cardiovascular:  Tachycardia  Pulmonary/Chest: Effort normal.  Abdominal: Soft. There is no tenderness.  Musculoskeletal: He exhibits no edema.  Neurological: He is alert.  Patient with diffuse rigors.  Skin: Skin is warm. Capillary refill takes less than 2 seconds.     ED Treatments / Results  Labs (all labs ordered are listed, but only abnormal results are displayed) Labs Reviewed  BASIC METABOLIC PANEL - Abnormal; Notable for the following:       Result Value   Chloride 99 (*)    Creatinine, Ser 1.26 (*)    GFR calc non Af Amer 60 (*)    All other components within normal limits  CBC - Abnormal; Notable for the following:    Platelets 136 (*)    All other components within normal limits  I-STAT CG4 LACTIC ACID, ED - Abnormal; Notable for the following:    Lactic Acid, Venous 3.43 (*)    All other components within normal limits  CULTURE, BLOOD (ROUTINE X 2)  CULTURE, BLOOD (ROUTINE X 2)  I-STAT TROPOININ, ED    EKG  EKG Interpretation  Date/Time:  Thursday September 02 2016 14:28:57 EDT Ventricular Rate:  114 PR Interval:    QRS Duration: 79 QT Interval:  310 QTC Calculation: 427 R Axis:   40 Text Interpretation:  Sinus tachycardia Baseline wander in lead(s) V3 other than tachycardia, no acute changes  Confirmed by Brantley Stage [phone]) on 09/02/2016 2:55:55 PM       Radiology No results found.  Procedures Procedures (including critical care time)  Medications Ordered in ED Medications  sodium chloride 0.9 % bolus 1,000 mL (1,000 mLs Intravenous New Bag/Given 09/02/16 1426)  Ampicillin-Sulbactam (UNASYN) 3 g in sodium chloride 0.9 % 100 mL IVPB (not administered)  sodium chloride 0.9 % bolus 1,000 mL (not administered)  acetaminophen (TYLENOL) tablet 1,000 mg (1,000 mg Oral Given 09/02/16 1434)     Initial Impression / Assessment and Plan / ED Course  I have reviewed the triage vital signs and the nursing  notes.  Pertinent labs & imaging results that were available during my care of the patient were reviewed by me and considered in my medical decision making (see chart for details).     Patient with fever and chills with likely bacteremia after dental extraction. Likely bacteremia from the extraction. Hopefully transient. Discussed with Dr. Legrand Como from infectious disease. We'll give Unasyn now. Blood culture sent. If patient defervesces and feels better likely able to discharge home. If blood cultures later come back positive will likely need a recheck. Will discharge home with antibiotics. Care will be turned over to Dr. Oleta Mouse  Final Clinical Impressions(s) / ED Diagnoses   Final  diagnoses:  Post-procedural fever    New Prescriptions New Prescriptions   No medications on file     Davonna Belling, MD 09/02/16 1457

## 2016-09-02 NOTE — ED Notes (Addendum)
CRITICAL VALUE ALERT  Critical Value:  Lactic Acid 3.43  Date & Time Notied:  09/02/2016 @ 7741  Provider Notified: Dr. Oleta Mouse  Orders Received/Actions taken: MD made aware

## 2016-09-02 NOTE — ED Provider Notes (Signed)
Please see previous physicians note regarding patient's presenting history and physical, initial ED course, and associated medical decision making.  Patient presenting with rigors and fever after dental extraction for dental infection just prior to arrival. Suspected transient bacteremia from the dental infection. Previous provider spoke with Dr. Linus Salmons from ID who recommended IV antibiotics x 1, IVF and reassess. Did not necessary feel he required admission if feeling improved. Blood work with initial lactate of 3.4, no leukocytosis, or other end organ damage. Plan was for patient to receive IV fluids, antipyretics, and first dose of Unasyn. The patient feels improved with downtrending lactate,  was felt to be safe for discharge home.   Patient well appearing on my evaluation. His initial tachycardia had resolved. Repeat temperature after antipyretics for me was 98.5. He feels improved. After 2 L of IV fluids, his initial elevated lactic acid has now normalized. Patient feels comfortable with discharge and continued outpatient antibiotics. Strict return and follow-up instructions reviewed. He expressed understanding of all discharge instructions and felt comfortable with the plan of care.     Forde Dandy, MD 09/02/16 [phone]

## 2016-09-02 NOTE — ED Notes (Signed)
ED Provider at bedside. 

## 2016-09-02 NOTE — ED Triage Notes (Addendum)
Pt reports had a tooth pulled x1 hour ago. Pt reports sudden onset chest and generalized trembling after leaving dentist. Moderate swelling noted to right side of face. Airway patent.

## 2016-09-02 NOTE — ED Notes (Signed)
MD Alvino Chapel made aware we were unable to obtain an EKG at this time due to pt shivering and shacking. Will get one when pt has calmed down some and image is clear.

## 2016-09-02 NOTE — ED Notes (Addendum)
Unable to perform EKG and BP due pt trembling. EDP aware and at bedside.

## 2016-09-07 LAB — CULTURE, BLOOD (ROUTINE X 2)
CULTURE: NO GROWTH
CULTURE: NO GROWTH
SPECIAL REQUESTS: ADEQUATE
Special Requests: ADEQUATE

## 2016-09-16 ENCOUNTER — Ambulatory Visit (INDEPENDENT_AMBULATORY_CARE_PROVIDER_SITE_OTHER): Payer: BLUE CROSS/BLUE SHIELD | Admitting: Orthopaedic Surgery

## 2017-04-19 ENCOUNTER — Ambulatory Visit (INDEPENDENT_AMBULATORY_CARE_PROVIDER_SITE_OTHER): Payer: BLUE CROSS/BLUE SHIELD

## 2017-04-19 ENCOUNTER — Encounter (INDEPENDENT_AMBULATORY_CARE_PROVIDER_SITE_OTHER): Payer: Self-pay | Admitting: Orthopaedic Surgery

## 2017-04-19 ENCOUNTER — Ambulatory Visit (INDEPENDENT_AMBULATORY_CARE_PROVIDER_SITE_OTHER): Payer: BLUE CROSS/BLUE SHIELD | Admitting: Orthopaedic Surgery

## 2017-04-19 DIAGNOSIS — M25522 Pain in left elbow: Secondary | ICD-10-CM

## 2017-04-19 MED ORDER — METHYLPREDNISOLONE 4 MG PO TBPK: ORAL_TABLET | ORAL | 0 refills | Status: DC

## 2017-04-19 NOTE — Addendum Note (Signed)
Addended by: Precious Bard on: 04/19/2017 12:56 PM   Modules accepted: Orders

## 2017-04-19 NOTE — Progress Notes (Signed)
Office Visit Note   Patient: Blake Solis           Date of Birth: 31-Aug-1955           MRN: [id number] Visit Date: 04/19/2017              Requested by: Arsenio Katz, NP [place], [place] [address] PCP: Arsenio Katz, NP   Assessment & Plan: Visit Diagnoses:  1. 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  Recommend nerve conduction studies.  Medrol Dosepak in the meantime.  Follow-up after the study.  Follow-Up Instructions: Return if symptoms worsen or fail to improve.   Orders:  Orders Placed This Encounter  Procedures  . XR Elbow 2 Views Left   Meds ordered this encounter  Medications  . methylPREDNISolone (MEDROL DOSEPAK) 4 MG TBPK tablet    Sig: Use as directed    Dispense:  21 tablet    Refill:  0      Procedures: No procedures performed   Clinical Data: No additional findings.   Subjective: Chief Complaint  Patient presents with  . Left Wrist - Pain  . Left Elbow - Pain    Patient is a 62 year old gentleman comes in with left forearm and left hand pain and numbness.  This is been going on for couple months.  Denies any injuries.  He has had previous medial elbow surgery in which she described the cyst was taken out from behind nerve.    Review of Systems  Constitutional: Negative.   All other systems reviewed and are negative.    Objective: Vital Signs: There were no vitals taken for this visit.  Physical Exam  Constitutional: He is oriented to person, place, and time. He appears well-developed and well-nourished.  HENT:  Head: Normocephalic and atraumatic.  Eyes: Pupils are equal, round, and reactive to light.  Neck: Neck supple.  Pulmonary/Chest: Effort normal.  Abdominal: Soft.  Musculoskeletal: Normal range of motion.  Neurological: He is alert and oriented to person, place, and time.  Skin: Skin is warm.  Psychiatric: He has a normal mood and affect. His behavior is normal. Judgment  and thought content normal.  Nursing note and vitals reviewed.   Ortho Exam Left elbow exam shows a fully healed surgical scar.  He has a positive Tinel's at the cubital tunnel.  No muscle atrophy.  Negative Tinel and radial tunnel.  Elbow range of motion is slightly limited secondary to degenerative joint disease Specialty Comments:  No specialty comments available.  Imaging: Xr Elbow 2 Views Left  Result Date: 04/19/2017 Degenerative joint disease left elbow    PMFS History: Patient Active Problem List   Diagnosis Date Noted  . Avascular necrosis of right femoral head ([facility]) 06/07/2012    Class: Diagnosis of  . HTN (hypertension) 07/08/2010  . Hyperlipemia 07/08/2010  . Prostatitis 07/08/2010  . Degenerative disc disease 07/08/2010  . Hydrocele of testis 07/08/2010  . DYSLIPIDEMIA 09/08/2009  . OVERWEIGHT 09/08/2009  . HYPERTENSION 09/08/2009  . DIZZINESS 09/08/2009   Past Medical History:  Diagnosis Date  . Arthritis   . Bronchitis, allergic   . Dizziness   . Dyslipidemia   . GERD (gastroesophageal reflux disease)   . HTN (hypertension)   . Hypertension   . OSA (obstructive sleep apnea)   . Overweight(278.02)     History reviewed. No pertinent family history.  Past Surgical History:  Procedure Laterality Date  . CARDIAC  CATHETERIZATION    . CYST EXCISION Right    cyst removed from arm  . ELBOW SURGERY Left   . HIP ARTHROPLASTY Right   . KIDNEY DONATION Left   . KNEE ARTHROSCOPY  2013  . lft ulnar nerve removed     decompression  . 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  . TOTAL HIP ARTHROPLASTY Right 06/08/2012  . TOTAL HIP ARTHROPLASTY Right 06/07/2012   Procedure: TOTAL HIP ARTHROPLASTY ANTERIOR APPROACH;  Surgeon: Marybelle Killings, MD;  Location: [facility];  Service: Orthopedics;  Laterality: Right;  Right Total Hip Arthroplasty-Anterior Approach   Social History   Occupational History  . Not on file  Tobacco Use  . Smoking status:  Former Smoker    Packs/day: 1.00    Years: 40.00    Pack years: 40.00    Types: Cigarettes    Last attempt to quit: 04/22/1998    Years since quitting: 19.0  . Smokeless tobacco: Never Used  Substance and Sexual Activity  . Alcohol use: No  . Drug use: No  . Sexual activity: Not on file

## 2017-04-21 ENCOUNTER — Telehealth (INDEPENDENT_AMBULATORY_CARE_PROVIDER_SITE_OTHER): Payer: Self-pay | Admitting: Orthopaedic Surgery

## 2017-04-21 NOTE — Telephone Encounter (Signed)
  This is regarding ---LEFT NCV/EMG CUBITAL TUNNEL. See message below.

## 2017-04-21 NOTE — Telephone Encounter (Signed)
Patient called advised he could not get in to see Dr. Ernestina Patches until 05/05/17. 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 Patient advised message can be left with his wife. The number to contact patient is [phone]

## 2017-04-21 NOTE — Telephone Encounter (Signed)
Is there anyone else that Erlinda Hong uses for this?

## 2017-04-22 NOTE — Telephone Encounter (Signed)
We can try [facility] but I doubt they'll get him in any sooner.

## 2017-04-25 ENCOUNTER — Telehealth (INDEPENDENT_AMBULATORY_CARE_PROVIDER_SITE_OTHER): Payer: Self-pay | Admitting: Orthopaedic Surgery

## 2017-04-25 NOTE — Telephone Encounter (Signed)
Per Loma Sousa:  "Patient was referred to Dr. Ernestina Patches for a nerve study, and he is scheduled for next week- Thursday, 2/14. He called today and left a message wanting to know I he can get in somewhere else any sooner. We have no available appointments before then. Please advise and call patient to let him know if he can get in sooner somewhere else."   If we can get them in sooner dovyou want me to put order in?

## 2017-04-25 NOTE — Telephone Encounter (Signed)
See other message

## 2017-04-26 NOTE — Telephone Encounter (Signed)
See message below °

## 2017-04-26 NOTE — Telephone Encounter (Signed)
Patient wondering if anyone could get him any sooner, any updates? Please advise

## 2017-04-27 NOTE — Telephone Encounter (Signed)
I called over to [facility] and spoke wih Diane and she states the soonest she can get some one in is mid Feb around the 18th, then I called over to Dr. Gwenevere Ghazi office with [facility] in [place] and their first available is Mar 1.  I called pt and sw his wife and advised her and she voiced she understood and will wait for the appt on the 14th and states he is on the cancellation list.

## 2017-05-05 ENCOUNTER — Encounter (INDEPENDENT_AMBULATORY_CARE_PROVIDER_SITE_OTHER): Payer: Self-pay | Admitting: Physical Medicine and Rehabilitation

## 2017-05-05 ENCOUNTER — Ambulatory Visit (INDEPENDENT_AMBULATORY_CARE_PROVIDER_SITE_OTHER): Payer: BLUE CROSS/BLUE SHIELD | Admitting: Physical Medicine and Rehabilitation

## 2017-05-05 DIAGNOSIS — R202 Paresthesia of skin: Secondary | ICD-10-CM

## 2017-05-05 NOTE — Progress Notes (Signed)
Blake Solis - 62 y.o. male MRN [id number]  Date of birth: 06-06-1955  Office Visit Note: Visit Date: 05/05/2017 PCP: Arsenio Katz, NP Referred by: Arsenio Katz, NP  Subjective: Chief Complaint  Patient presents with  . Left Forearm - Pain, Edema  . Left Hand - Numbness, Pain, Tingling   HPI: Blake Solis is a 62 year old left-hand-dominant gentleman who comes in today at the request of Dr. Erlinda Hong for electrodiagnostic evaluation of the left upper extremity.  He states that many years ago he had a cyst removed from the left elbow and that they transposed or decompressed around the ulnar nerve.  He reports at the time of that surgery he was having pain and symptoms and numbness into the fifth digit and those symptoms were resolved after the surgery.  He now describes 1 month history of worsening sharp left forearm and hand pain with numbness and tingling into the fifth digit in the ring finger.  He reports this is very similar symptoms that he has had in the past.  The one difference this time is he is having significant weakness and difficulty grabbing objects or holding onto things.  He reports holding onto anything makes his symptoms worse.  Nothing he has tried so far has made it better.  He is tried anti-inflammatories as well as prednisone.  He does endorse what he feels like is some swelling of the left forearm.  He denies any specific injury or inciting event that occurred 1 month ago.  He is not a diabetic.  He denies any frank radicular pain.    ROS Otherwise per HPI.  Assessment & Plan: Visit Diagnoses:  1. Paresthesia of skin     Plan: No additional findings.  Impression: The above electrodiagnostic study is ABNORMAL and reveals evidence of:  1.  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 The lesion is characterized by sensory and motor demyelination with evidence of significant axonal injury.  There is significant  weakness on physical exam but with only mild atrophy.  I think this is likely from the timing of the initial onset of symptoms being only a few weeks ago.  2.  A mild essentially asymptomatic left median nerve entrapment at the wrist affecting sensory components.   Recommendations: 1.  Follow-up with referring physician. 2.  Continue current management of symptoms. 3.  Suggest surgical evaluation.   Meds & Orders: No orders of the defined types were placed in this encounter.   Orders Placed This Encounter  Procedures  . NCV with EMG (electromyography)    Follow-up: Return for Dr. Erlinda Hong as scheduled.   Procedures: No procedures performed  EMG & NCV Findings: Evaluation of the left ulnar motor nerve showed prolonged distal onset latency (5.8 ms), reduced amplitude (0.0 mV), and decreased conduction velocity (B Elbow-Wrist, 24 m/s).  The left median (across palm) sensory nerve showed no response (Palm) and prolonged distal peak latency (4.4 ms).  The left ulnar sensory nerve showed no response (Wrist).  All remaining nerves (as indicated in the following tables) were within normal limits.    Needle evaluation of the left first dorsal interosseous muscle showed increased insertional activity, widespread spontaneous activity, and diminished recruitment.  The left flexor digitorum profundus muscle showed increased insertional activity, slightly increased spontaneous activity, and diminished recruitment.  All remaining muscles (as indicated in the following table) showed no evidence of electrical instability.    Impression: The above electrodiagnostic study  is ABNORMAL and reveals evidence of:  1.  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 The lesion is characterized by sensory and motor demyelination with evidence of significant axonal injury.  There is significant weakness on physical exam but with only mild atrophy.  I think this  is likely from the timing of the initial onset of symptoms being only a few weeks ago.  2.  A mild essentially asymptomatic left median nerve entrapment at the wrist affecting sensory components.    Recommendations: 1.  Follow-up with referring physician. 2.  Continue current management of symptoms. 3.  Suggest surgical evaluation.   Nerve Conduction Studies Anti Sensory Summary Table   Stim Site NR Peak (ms) Norm Peak (ms) P-T Amp (V) Norm P-T Amp Site1 Site2 Delta-P (ms) Dist (cm) Vel (m/s) Norm Vel (m/s)  Left Median Acr Palm Anti Sensory (2nd Digit)  33.5C  Wrist    *4.4 <3.6 17.3 >10 Wrist Palm  0.0    Palm *NR  <2.0          Left Radial Anti Sensory (Base 1st Digit)  33.3C  Wrist    1.9 <3.1 31.8  Wrist Base 1st Digit 1.9 0.0    Left Ulnar Anti Sensory (5th Digit)  33.6C  Wrist *NR  <3.7  >15.0 Wrist 5th Digit  14.0  >38   Motor Summary Table   Stim Site NR Onset (ms) Norm Onset (ms) O-P Amp (mV) Norm O-P Amp Site1 Site2 Delta-0 (ms) Dist (cm) Vel (m/s) Norm Vel (m/s)  Left Median Motor (Abd Poll Brev)  33.5C  Wrist    4.2 <4.2 7.4 >5 Elbow Wrist 3.8 21.0 55 >50  Elbow    8.0  7.2         Left Ulnar Motor (Abd Dig Min)  33.5C  Wrist    *5.8 <4.2 *0.0 >3 B Elbow Wrist 8.0 19.0 *24 >53  B Elbow    13.8  0.0  A Elbow B Elbow 1.0 20.0 200 >53  A Elbow    14.8  0.1  Axilla A Elbow 1.0 0.0    Axilla    13.8  0.0          EMG   Side Muscle Nerve Root Ins Act Fibs Psw Amp Dur Poly Recrt Int Fraser Din Comment  Left Abd Poll Brev Median C8-T1 Nml Nml Nml Nml Nml 0 Nml Nml   Left 1stDorInt Ulnar C8-T1 *Incr *4+ *4+ Nml Nml 0 *Reduced Nml minimal MUAP  Left PronatorTeres Median C6-7 Nml Nml Nml Nml Nml 0 Nml Nml   Left Biceps Musculocut C5-6 Nml Nml Nml Nml Nml 0 Nml Nml   Left Deltoid Axillary C5-6 Nml Nml Nml Nml Nml 0 Nml Nml   Left FlexDigProf Ulnar C8,T1 *Incr *1+ *1+ Nml Nml 0 *Reduced Nml     Nerve Conduction Studies Anti Sensory Left/Right Comparison   Stim Site L  Lat (ms) R Lat (ms) L-R Lat (ms) L Amp (V) R Amp (V) L-R Amp (%) Site1 Site2 L Vel (m/s) R Vel (m/s) L-R Vel (m/s)  Median Acr Palm Anti Sensory (2nd Digit)  33.5C  Wrist *4.4   17.3   Wrist Palm     Palm             Radial Anti Sensory (Base 1st Digit)  33.3C  Wrist 1.9   31.8   Wrist Base 1st Digit     Ulnar Anti Sensory (  5th Digit)  33.6C  Wrist       Wrist 5th Digit      Motor Left/Right Comparison   Stim Site L Lat (ms) R Lat (ms) L-R Lat (ms) L Amp (mV) R Amp (mV) L-R Amp (%) Site1 Site2 L Vel (m/s) R Vel (m/s) L-R Vel (m/s)  Median Motor (Abd Poll Brev)  33.5C  Wrist 4.2   7.4   Elbow Wrist 55    Elbow 8.0   7.2         Ulnar Motor (Abd Dig Min)  33.5C  Wrist *5.8   *0.0   B Elbow Wrist *24    B Elbow 13.8   0.0   A Elbow B Elbow 200    A Elbow 14.8   0.1   Axilla A Elbow     Axilla 13.8   0.0            Waveforms:            Clinical History: No specialty comments available.  He reports that he quit smoking about 19 years ago. His smoking use included cigarettes. He has a 40.00 pack-year smoking history. he has never used smokeless tobacco. No results for input(s): HGBA1C, LABURIC in the last 8760 hours.  Objective:  VS:  HT:    WT:   BMI:     BP:   HR: bpm  TEMP: ( )  RESP:  Physical Exam  Musculoskeletal:  Inspection reveals mild flattening of the FDI and ADM on the left compared to right no atrophy of the bilateral APB or hand intrinsics. There is no swelling, color changes, allodynia or dystrophic changes. There is 2/3 strength with the left finger abduction and 5 out of 5 strength in the bilateral wrist extension and long finger flexion.  There is decreased sensation to light touch in ulnar nerve distribution on the left.  There is a positive Froment's test left.  There is a positive Wartenberg and equivocal Benedictine sign on the left.  There is a negative Hoffmann's test bilaterally.    Ortho Exam Imaging: No results found.  Past  Medical/Family/Surgical/Social History: Medications & Allergies reviewed per EMR Patient Active Problem List   Diagnosis Date Noted  . Avascular necrosis of right femoral head ([place]) 06/07/2012    Class: Diagnosis of  . HTN (hypertension) 07/08/2010  . Hyperlipemia 07/08/2010  . Prostatitis 07/08/2010  . Degenerative disc disease 07/08/2010  . Hydrocele of testis 07/08/2010  . DYSLIPIDEMIA 09/08/2009  . OVERWEIGHT 09/08/2009  . HYPERTENSION 09/08/2009  . DIZZINESS 09/08/2009   Past Medical History:  Diagnosis Date  . Arthritis   . Bronchitis, allergic   . Dizziness   . Dyslipidemia   . GERD (gastroesophageal reflux disease)   . HTN (hypertension)   . Hypertension   . OSA (obstructive sleep apnea)   . Overweight(278.02)    History reviewed. No pertinent family history. Past Surgical History:  Procedure Laterality Date  . CARDIAC CATHETERIZATION    . CYST EXCISION Right    cyst removed from arm  . ELBOW SURGERY Left   . HIP ARTHROPLASTY Right   . KIDNEY DONATION Left   . KNEE ARTHROSCOPY  2013  . lft ulnar nerve removed     decompression  . 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  . TOTAL HIP ARTHROPLASTY Right 06/08/2012  . TOTAL HIP ARTHROPLASTY Right 06/07/2012   Procedure: TOTAL HIP ARTHROPLASTY ANTERIOR APPROACH;  Surgeon: Thana Farr  Lorin Mercy, MD;  Location: [facility];  Service: Orthopedics;  Laterality: Right;  Right Total Hip Arthroplasty-Anterior Approach   Social History   Occupational History  . Not on file  Tobacco Use  . Smoking status: Former Smoker    Packs/day: 1.00    Years: 40.00    Pack years: 40.00    Types: Cigarettes    Last attempt to quit: 04/22/1998    Years since quitting: 19.0  . Smokeless tobacco: Never Used  Substance and Sexual Activity  . Alcohol use: No  . Drug use: No  . Sexual activity: Not on file

## 2017-05-05 NOTE — Progress Notes (Deleted)
Pt states sharp pain in left forearm and left hand. Pt also states numbness and tingling in left hand pinky finger and ring finger. Pt states some swelling in left forearm as well. Pt states symptoms has been going on for a month. Pt states he is having difficulty grabbing object and holding items which makes his pain worse, nothing makes symptoms better. Left hand dominant. No lotions or creams.

## 2017-05-05 NOTE — Procedures (Signed)
EMG & NCV Findings: Evaluation of the left ulnar motor nerve showed prolonged distal onset latency (5.8 ms), reduced amplitude (0.0 mV), and decreased conduction velocity (B Elbow-Wrist, 24 m/s).  The left median (across palm) sensory nerve showed no response (Palm) and prolonged distal peak latency (4.4 ms).  The left ulnar sensory nerve showed no response (Wrist).  All remaining nerves (as indicated in the following tables) were within normal limits.    Needle evaluation of the left first dorsal interosseous muscle showed increased insertional activity, widespread spontaneous activity, and diminished recruitment.  The left flexor digitorum profundus muscle showed increased insertional activity, slightly increased spontaneous activity, and diminished recruitment.  All remaining muscles (as indicated in the following table) showed no evidence of electrical instability.    Impression: The above electrodiagnostic study is ABNORMAL and reveals evidence of:  1.  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 The lesion is characterized by sensory and motor demyelination with evidence of significant axonal injury.  There is significant weakness on physical exam but with only mild atrophy.  I think this is likely from the timing of the initial onset of symptoms being only a few weeks ago.  2.  A mild essentially asymptomatic left median nerve entrapment at the wrist affecting sensory components.    Recommendations: 1.  Follow-up with referring physician. 2.  Continue current management of symptoms. 3.  Suggest surgical evaluation.   Nerve Conduction Studies Anti Sensory Summary Table   Stim Site NR Peak (ms) Norm Peak (ms) P-T Amp (V) Norm P-T Amp Site1 Site2 Delta-P (ms) Dist (cm) Vel (m/s) Norm Vel (m/s)  Left Median Acr Palm Anti Sensory (2nd Digit)  33.5C  Wrist    *4.4 <3.6 17.3 >10 Wrist Palm  0.0    Palm *NR  <2.0          Left  Radial Anti Sensory (Base 1st Digit)  33.3C  Wrist    1.9 <3.1 31.8  Wrist Base 1st Digit 1.9 0.0    Left Ulnar Anti Sensory (5th Digit)  33.6C  Wrist *NR  <3.7  >15.0 Wrist 5th Digit  14.0  >38   Motor Summary Table   Stim Site NR Onset (ms) Norm Onset (ms) O-P Amp (mV) Norm O-P Amp Site1 Site2 Delta-0 (ms) Dist (cm) Vel (m/s) Norm Vel (m/s)  Left Median Motor (Abd Poll Brev)  33.5C  Wrist    4.2 <4.2 7.4 >5 Elbow Wrist 3.8 21.0 55 >50  Elbow    8.0  7.2         Left Ulnar Motor (Abd Dig Min)  33.5C  Wrist    *5.8 <4.2 *0.0 >3 B Elbow Wrist 8.0 19.0 *24 >53  B Elbow    13.8  0.0  A Elbow B Elbow 1.0 20.0 200 >53  A Elbow    14.8  0.1  Axilla A Elbow 1.0 0.0    Axilla    13.8  0.0          EMG   Side Muscle Nerve Root Ins Act Fibs Psw Amp Dur Poly Recrt Int Fraser Din Comment  Left Abd Poll Brev Median C8-T1 Nml Nml Nml Nml Nml 0 Nml Nml   Left 1stDorInt Ulnar C8-T1 *Incr *4+ *4+ Nml Nml 0 *Reduced Nml minimal MUAP  Left PronatorTeres Median C6-7 Nml Nml Nml Nml Nml 0 Nml Nml   Left Biceps Musculocut C5-6 Nml Nml Nml Nml Nml 0 Nml Nml  Left Deltoid Axillary C5-6 Nml Nml Nml Nml Nml 0 Nml Nml   Left FlexDigProf Ulnar C8,T1 *Incr *1+ *1+ Nml Nml 0 *Reduced Nml     Nerve Conduction Studies Anti Sensory Left/Right Comparison   Stim Site L Lat (ms) R Lat (ms) L-R Lat (ms) L Amp (V) R Amp (V) L-R Amp (%) Site1 Site2 L Vel (m/s) R Vel (m/s) L-R Vel (m/s)  Median Acr Palm Anti Sensory (2nd Digit)  33.5C  Wrist *4.4   17.3   Wrist Palm     Palm             Radial Anti Sensory (Base 1st Digit)  33.3C  Wrist 1.9   31.8   Wrist Base 1st Digit     Ulnar Anti Sensory (5th Digit)  33.6C  Wrist       Wrist 5th Digit      Motor Left/Right Comparison   Stim Site L Lat (ms) R Lat (ms) L-R Lat (ms) L Amp (mV) R Amp (mV) L-R Amp (%) Site1 Site2 L Vel (m/s) R Vel (m/s) L-R Vel (m/s)  Median Motor (Abd Poll Brev)  33.5C  Wrist 4.2   7.4   Elbow Wrist 55    Elbow 8.0   7.2         Ulnar  Motor (Abd Dig Min)  33.5C  Wrist *5.8   *0.0   B Elbow Wrist *24    B Elbow 13.8   0.0   A Elbow B Elbow 200    A Elbow 14.8   0.1   Axilla A Elbow     Axilla 13.8   0.0            Waveforms:

## 2017-05-06 ENCOUNTER — Ambulatory Visit (INDEPENDENT_AMBULATORY_CARE_PROVIDER_SITE_OTHER): Payer: BLUE CROSS/BLUE SHIELD | Admitting: Orthopaedic Surgery

## 2017-05-06 DIAGNOSIS — G5622 Lesion of ulnar nerve, left upper limb: Secondary | ICD-10-CM | POA: Diagnosis not present

## 2017-05-06 NOTE — Progress Notes (Signed)
Office Visit Note   Patient: Blake Solis           Date of Birth: [birth year]           MRN: [id number] Visit Date: 05/06/2017              Requested by: Arsenio Katz, NP [place], [place] [address] PCP: Arsenio Katz, NP   Assessment & Plan: Visit Diagnoses:  1. Cubital tunnel syndrome on left     Plan: Impression is severe left cubital tunnel syndrome.  I did review the previous operative record from Dr. Christella Noa in 2012 in which she removed a ganglion cyst around what sounds like an anconeus epitrochlearis muscle.  He did not undergo ulnar nerve decompression at that time.  The plan at this point is to undergo decompression of the ulnar nerve as well as anterior transposition.  We discussed the risk benefits alternatives to surgery including the expected outcome from the surgery.  Questions encouraged and answered.  We will get him scheduled in the near future.  Follow-Up Instructions: Return if symptoms worsen or fail to improve.   Orders:  No orders of the defined types were placed in this encounter.  No orders of the defined types were placed in this encounter.     Procedures: No procedures performed   Clinical Data: No additional findings.   Subjective: Chief Complaint  Patient presents with  . Left Wrist - Follow-up  . Left Hand - Follow-up    Patient follows up today for review of nerve conduction studies.    Review of Systems  Constitutional: Negative.   All other systems reviewed and are negative.    Objective: Vital Signs: There were no vitals taken for this visit.  Physical Exam  Constitutional: He is oriented to person, place, and time. He appears well-developed and well-nourished.  Pulmonary/Chest: Effort normal.  Abdominal: Soft.  Neurological: He is alert and oriented to person, place, and time.  Skin: Skin is warm.  Psychiatric: He has a normal mood and affect. His behavior is normal. Judgment and thought content normal.  Nursing  note and vitals reviewed.   Ortho Exam Left elbow exam shows a fully healed surgical scar.  Positive Tinel at the cubital tunnel. Specialty Comments:  No specialty comments available.  Imaging: No results found.   PMFS History: Patient Active Problem List   Diagnosis Date Noted  . Avascular necrosis of right femoral head ([facility]) 06/07/2012    Class: Diagnosis of  . HTN (hypertension) 07/08/2010  . Hyperlipemia 07/08/2010  . Prostatitis 07/08/2010  . Degenerative disc disease 07/08/2010  . Hydrocele of testis 07/08/2010  . DYSLIPIDEMIA 09/08/2009  . OVERWEIGHT 09/08/2009  . HYPERTENSION 09/08/2009  . DIZZINESS 09/08/2009   Past Medical History:  Diagnosis Date  . Arthritis   . Bronchitis, allergic   . Dizziness   . Dyslipidemia   . GERD (gastroesophageal reflux disease)   . HTN (hypertension)   . Hypertension   . OSA (obstructive sleep apnea)   . Overweight(278.02)     No family history on file.  Past Surgical History:  Procedure Laterality Date  . CARDIAC CATHETERIZATION    . CYST EXCISION Right    cyst removed from arm  . ELBOW SURGERY Left   . HIP ARTHROPLASTY Right   . KIDNEY DONATION Left   . KNEE ARTHROSCOPY  2013  . lft ulnar nerve removed     decompression  . NEPHRECTOMY     left, donated  to his brother, EF 55-60%...  . TOTAL HIP ARTHROPLASTY Right 06/08/2012  . TOTAL HIP ARTHROPLASTY Right 06/07/2012   Procedure: TOTAL HIP ARTHROPLASTY ANTERIOR APPROACH;  Surgeon: Marybelle Killings, MD;  Location: [facility];  Service: Orthopedics;  Laterality: Right;  Right Total Hip Arthroplasty-Anterior Approach   Social History   Occupational History  . Not on file  Tobacco Use  . Smoking status: Former Smoker    Packs/day: 1.00    Years: 40.00    Pack years: 40.00    Types: Cigarettes    Last attempt to quit: 04/22/1998    Years since quitting: 19.0  . Smokeless tobacco: Never Used  Substance and Sexual Activity  . Alcohol use: No  . Drug use: No  . Sexual  activity: Not on file

## 2017-05-09 ENCOUNTER — Telehealth (INDEPENDENT_AMBULATORY_CARE_PROVIDER_SITE_OTHER): Payer: Self-pay

## 2017-05-09 NOTE — Telephone Encounter (Signed)
Patient will be having surgery on Friday and he would like to know about how long he would be OOW and I asked Mendel Ryder and she states Approx 6 Weeks.  Called Patient to advise.

## 2017-05-13 ENCOUNTER — Encounter (INDEPENDENT_AMBULATORY_CARE_PROVIDER_SITE_OTHER): Payer: Self-pay | Admitting: Orthopaedic Surgery

## 2017-05-13 DIAGNOSIS — G5622 Lesion of ulnar nerve, left upper limb: Secondary | ICD-10-CM | POA: Diagnosis not present

## 2017-05-17 ENCOUNTER — Telehealth (INDEPENDENT_AMBULATORY_CARE_PROVIDER_SITE_OTHER): Payer: Self-pay | Admitting: Orthopaedic Surgery

## 2017-05-17 NOTE — Telephone Encounter (Signed)
See message below °

## 2017-05-17 NOTE — Telephone Encounter (Signed)
Patient would like to know if the work note could be changed to go back to work on 05/23/2017. He would also like to know the earliest date he can get the stitches out, please advise # [phone]

## 2017-05-17 NOTE — Telephone Encounter (Signed)
yes

## 2017-05-19 ENCOUNTER — Encounter (INDEPENDENT_AMBULATORY_CARE_PROVIDER_SITE_OTHER): Payer: Self-pay

## 2017-05-19 NOTE — Telephone Encounter (Signed)
Called to advise him note is ready for pick up at the front desk. And Monday would be the earliest to come in for appt.

## 2017-05-19 NOTE — Telephone Encounter (Signed)
Note made.  

## 2017-05-23 ENCOUNTER — Ambulatory Visit (INDEPENDENT_AMBULATORY_CARE_PROVIDER_SITE_OTHER): Payer: BLUE CROSS/BLUE SHIELD | Admitting: Orthopaedic Surgery

## 2017-05-23 ENCOUNTER — Encounter (INDEPENDENT_AMBULATORY_CARE_PROVIDER_SITE_OTHER): Payer: Self-pay | Admitting: Orthopaedic Surgery

## 2017-05-23 DIAGNOSIS — G5622 Lesion of ulnar nerve, left upper limb: Secondary | ICD-10-CM

## 2017-05-23 NOTE — Progress Notes (Signed)
Patient is 2 Weeks Left Cubital Tunl. release and ulnar nerve transposition.  He is doing well and denies any pain.  He would like to return back to work.  His incision has healed nicely without signs of infection.  He is neurovascular intact distally.  The sutures were removed today.  Return back to work today per his request.  Referral for therapy was provided today.  Follow-up in 6 weeks for recheck.

## 2017-05-26 ENCOUNTER — Ambulatory Visit (INDEPENDENT_AMBULATORY_CARE_PROVIDER_SITE_OTHER): Payer: BLUE CROSS/BLUE SHIELD | Admitting: Orthopaedic Surgery

## 2017-05-27 ENCOUNTER — Ambulatory Visit (INDEPENDENT_AMBULATORY_CARE_PROVIDER_SITE_OTHER): Payer: BLUE CROSS/BLUE SHIELD | Admitting: Orthopaedic Surgery

## 2017-06-01 ENCOUNTER — Ambulatory Visit: Payer: BLUE CROSS/BLUE SHIELD | Admitting: Family Medicine

## 2017-06-13 ENCOUNTER — Telehealth (INDEPENDENT_AMBULATORY_CARE_PROVIDER_SITE_OTHER): Payer: Self-pay | Admitting: Orthopaedic Surgery

## 2017-06-13 NOTE — Telephone Encounter (Signed)
[facility] needs recent OT order and the recent notes for patient  Wants faxed asap [phone]  Her# [phone]

## 2017-06-14 NOTE — Telephone Encounter (Signed)
[facility] has not received the information below, can you refax it please? # [phone]

## 2017-06-14 NOTE — Telephone Encounter (Signed)
Refaxed [phone]

## 2017-06-14 NOTE — Telephone Encounter (Signed)
Faxed to [phone]

## 2017-07-07 ENCOUNTER — Ambulatory Visit (INDEPENDENT_AMBULATORY_CARE_PROVIDER_SITE_OTHER): Payer: BLUE CROSS/BLUE SHIELD | Admitting: Orthopaedic Surgery

## 2017-07-07 ENCOUNTER — Encounter (INDEPENDENT_AMBULATORY_CARE_PROVIDER_SITE_OTHER): Payer: Self-pay | Admitting: Orthopaedic Surgery

## 2017-07-07 DIAGNOSIS — G5622 Lesion of ulnar nerve, left upper limb: Secondary | ICD-10-CM

## 2017-07-07 NOTE — Progress Notes (Signed)
Patient is 6 weeks status post left cubital tunnel release and transposition.  He is overall doing well.  He is doing home exercises daily.  He has no pain.  He still has a little bit of persistent numbness in his small finger but his ring finger numbness has completely resolved.  He is very happy.  His surgical scar is fully healed.  He has excellent range of motion of the elbow.  At this point I recommend that he just do home exercises.  Activity as tolerated.  Follow-up as needed.

## 2017-07-21 ENCOUNTER — Ambulatory Visit (INDEPENDENT_AMBULATORY_CARE_PROVIDER_SITE_OTHER): Payer: BLUE CROSS/BLUE SHIELD

## 2017-07-21 ENCOUNTER — Ambulatory Visit (INDEPENDENT_AMBULATORY_CARE_PROVIDER_SITE_OTHER): Payer: BLUE CROSS/BLUE SHIELD | Admitting: Orthopaedic Surgery

## 2017-07-21 DIAGNOSIS — M25511 Pain in right shoulder: Secondary | ICD-10-CM | POA: Diagnosis not present

## 2017-07-21 MED ORDER — METHYLPREDNISOLONE ACETATE 40 MG/ML IJ SUSP
40.0000 mg | INTRAMUSCULAR | Status: AC | PRN
  Administered 2017-07-21: 40 mg via INTRA_ARTICULAR

## 2017-07-21 MED ORDER — BUPIVACAINE HCL 0.5 % IJ SOLN
3.0000 mL | INTRAMUSCULAR | Status: AC | PRN
  Administered 2017-07-21: 3 mL via INTRA_ARTICULAR

## 2017-07-21 MED ORDER — LIDOCAINE HCL 1 % IJ SOLN
3.0000 mL | INTRAMUSCULAR | Status: AC | PRN
  Administered 2017-07-21: 3 mL

## 2017-07-21 NOTE — Progress Notes (Signed)
Office Visit Note   Patient: Blake Solis           Date of Birth: [birth year]           MRN: [id number] Visit Date: 07/21/2017              Requested by: Arsenio Katz, NP [place], [place] [address] PCP: Arsenio Katz, NP   Assessment & Plan: Visit Diagnoses:  1. Right shoulder pain, unspecified chronicity     Plan: Impression is 62 year old gentleman with right shoulder pain with impingement and likely rotator cuff tendinosis.  Subacromial injection performed today.  We provided him with home exercises.  If not better in about 4 to 6 weeks patient instructed to follow-up so that we can order an MRI to assess for rotator cuff tear.  In terms of his cubital tunnel release I think he is improving as expected.  He understands that with his severity of cubital tunnel syndrome his recovery is likely going to be protracted and he will not have a full recovery.    Follow-Up Instructions: Return if symptoms worsen or fail to improve.   Orders:  Orders Placed This Encounter  Procedures  . XR Shoulder Right   No orders of the defined types were placed in this encounter.     Procedures: Large Joint Inj: R subacromial bursa on 07/21/2017 4:06 PM Indications: pain Details: 22 G needle  Arthrogram: No  Medications: 3 mL lidocaine 1 %; 3 mL bupivacaine 0.5 %; 40 mg methylPREDNISolone acetate 40 MG/ML Outcome: tolerated well, no immediate complications Consent was given by the patient. Patient was prepped and draped in the usual sterile fashion.       Clinical Data: No additional findings.   Subjective: Chief Complaint  Patient presents with  . Left Hand - Pain    Patient comes in today for right shoulder pain and follow-up of left cubital tunnel release.  He states that his pain is significantly better.  He continues to have numbness in his left hand.  His right shoulder is also bothering him with the use.     Review of Systems  Constitutional: Negative.   All other  systems reviewed and are negative.    Objective: Vital Signs: There were no vitals taken for this visit.  Physical Exam  Constitutional: He is oriented to person, place, and time. He appears well-developed and well-nourished.  Pulmonary/Chest: Effort normal.  Abdominal: Soft.  Neurological: He is alert and oriented to person, place, and time.  Skin: Skin is warm.  Psychiatric: He has a normal mood and affect. His behavior is normal. Judgment and thought content normal.  Nursing note and vitals reviewed.   Ortho Exam Right shoulder exam shows positive Neer and positive Hawkins impingement.  Negative cross adduction.  Rotator cuff testing shows mild weakness without any focal deficits.  Left elbow exam shows a fully healed surgical scar.  He has no subluxation of the nerve.  He does have some mild atrophy of his first dorsal interosseous muscle which has been stable since initial presentation. Specialty Comments:  No specialty comments available.  Imaging: Xr Shoulder Right  Result Date: 07/21/2017 [facility] joint arthrosis.    PMFS History: Patient Active Problem List   Diagnosis Date Noted  . Cubital tunnel syndrome on left 05/13/2017  . Avascular necrosis of right femoral head ([place]) 06/07/2012    Class: Diagnosis of  . HTN (hypertension) 07/08/2010  . Hyperlipemia 07/08/2010  . Prostatitis 07/08/2010  .  Degenerative disc disease 07/08/2010  . Hydrocele of testis 07/08/2010  . DYSLIPIDEMIA 09/08/2009  . OVERWEIGHT 09/08/2009  . HYPERTENSION 09/08/2009  . DIZZINESS 09/08/2009   Past Medical History:  Diagnosis Date  . Arthritis   . Bronchitis, allergic   . Dizziness   . Dyslipidemia   . GERD (gastroesophageal reflux disease)   . HTN (hypertension)   . Hypertension   . OSA (obstructive sleep apnea)   . Overweight(278.02)     No family history on file.  Past Surgical History:  Procedure Laterality Date  . CARDIAC CATHETERIZATION    . CYST EXCISION Right    cyst  removed from arm  . ELBOW SURGERY Left   . HIP ARTHROPLASTY Right   . KIDNEY DONATION Left   . KNEE ARTHROSCOPY  2013  . lft ulnar nerve removed     decompression  . 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  . TOTAL HIP ARTHROPLASTY Right 06/08/2012  . TOTAL HIP ARTHROPLASTY Right 06/07/2012   Procedure: TOTAL HIP ARTHROPLASTY ANTERIOR APPROACH;  Surgeon: Marybelle Killings, MD;  Location: [facility];  Service: Orthopedics;  Laterality: Right;  Right Total Hip Arthroplasty-Anterior Approach   Social History   Occupational History  . Not on file  Tobacco Use  . Smoking status: Former Smoker    Packs/day: 1.00    Years: 40.00    Pack years: 40.00    Types: Cigarettes    Last attempt to quit: 04/22/1998    Years since quitting: 19.2  . Smokeless tobacco: Never Used  Substance and Sexual Activity  . Alcohol use: No  . Drug use: No  . Sexual activity: Not on file

## 2017-09-08 ENCOUNTER — Telehealth (INDEPENDENT_AMBULATORY_CARE_PROVIDER_SITE_OTHER): Payer: Self-pay | Admitting: Orthopaedic Surgery

## 2017-09-08 ENCOUNTER — Other Ambulatory Visit (INDEPENDENT_AMBULATORY_CARE_PROVIDER_SITE_OTHER): Payer: Self-pay

## 2017-09-08 DIAGNOSIS — G8929 Other chronic pain: Secondary | ICD-10-CM

## 2017-09-08 DIAGNOSIS — M25511 Pain in right shoulder: Principal | ICD-10-CM

## 2017-09-08 NOTE — Telephone Encounter (Signed)
See message below. Is this okay ? Last OV note said if not better MRI

## 2017-09-08 NOTE — Telephone Encounter (Signed)
Patient called asked if he can be set up for an MRI of his right shoulder. Patient said he is still in a lot of pain. The number to contact patient is [phone]

## 2017-09-09 NOTE — Telephone Encounter (Signed)
Ok to do

## 2017-09-09 NOTE — Telephone Encounter (Signed)
Referral made 

## 2017-09-14 ENCOUNTER — Other Ambulatory Visit (INDEPENDENT_AMBULATORY_CARE_PROVIDER_SITE_OTHER): Payer: Self-pay | Admitting: Physician Assistant

## 2017-09-14 DIAGNOSIS — M25511 Pain in right shoulder: Principal | ICD-10-CM

## 2017-09-14 DIAGNOSIS — G8929 Other chronic pain: Secondary | ICD-10-CM

## 2017-09-21 ENCOUNTER — Other Ambulatory Visit: Payer: Self-pay

## 2017-09-23 ENCOUNTER — Telehealth (INDEPENDENT_AMBULATORY_CARE_PROVIDER_SITE_OTHER): Payer: Self-pay | Admitting: Orthopaedic Surgery

## 2017-09-23 NOTE — Telephone Encounter (Signed)
Stout for Valium?

## 2017-09-23 NOTE — Telephone Encounter (Signed)
Patient requesting medication for MRI since he is claustrophobic. His MRI is scheduled for 7/10 Please advise # [phone]

## 2017-09-25 NOTE — Telephone Encounter (Signed)
Yes 5 mg

## 2017-09-26 ENCOUNTER — Other Ambulatory Visit (INDEPENDENT_AMBULATORY_CARE_PROVIDER_SITE_OTHER): Payer: Self-pay

## 2017-09-26 MED ORDER — DIAZEPAM 5 MG PO TABS: ORAL_TABLET | ORAL | 0 refills | Status: DC

## 2017-09-26 NOTE — Telephone Encounter (Signed)
Called Rx into pharm Patient aware.

## 2017-09-26 NOTE — Telephone Encounter (Signed)
Please see below.

## 2017-09-27 ENCOUNTER — Ambulatory Visit (INDEPENDENT_AMBULATORY_CARE_PROVIDER_SITE_OTHER): Payer: BLUE CROSS/BLUE SHIELD | Admitting: Orthopaedic Surgery

## 2017-09-28 ENCOUNTER — Ambulatory Visit
Admission: RE | Admit: 2017-09-28 | Discharge: 2017-09-28 | Disposition: A | Discharge status: Home / Self Care | Payer: BLUE CROSS/BLUE SHIELD | Source: Ambulatory Visit | Attending: Physician Assistant | Admitting: Physician Assistant

## 2017-09-28 DIAGNOSIS — G8929 Other chronic pain: Secondary | ICD-10-CM

## 2017-09-28 DIAGNOSIS — M25511 Pain in right shoulder: Principal | ICD-10-CM

## 2017-09-28 NOTE — Progress Notes (Signed)
Fu appt to discuss mri

## 2017-09-29 ENCOUNTER — Ambulatory Visit (INDEPENDENT_AMBULATORY_CARE_PROVIDER_SITE_OTHER): Payer: BLUE CROSS/BLUE SHIELD | Admitting: Orthopaedic Surgery

## 2017-09-29 DIAGNOSIS — M75101 Unspecified rotator cuff tear or rupture of right shoulder, not specified as traumatic: Secondary | ICD-10-CM | POA: Insufficient documentation

## 2017-09-29 DIAGNOSIS — M19011 Primary osteoarthritis, right shoulder: Secondary | ICD-10-CM

## 2017-09-29 DIAGNOSIS — M7541 Impingement syndrome of right shoulder: Secondary | ICD-10-CM | POA: Diagnosis not present

## 2017-09-29 NOTE — Progress Notes (Signed)
Office Visit Note   Patient: Blake Solis           Date of Birth: [birth year]           MRN: [id number] Visit Date: 09/29/2017              Requested by: Arsenio Katz, NP [place], [place] [address] PCP: Arsenio Katz, NP   Assessment & Plan: Visit Diagnoses:  1. Nontraumatic tear of right supraspinatus tendon   2. Impingement syndrome of right shoulder   3. Arthrosis of right acromioclavicular joint     Plan: MRI findings are consistent with a partial articular surface tearing of the supraspinatus tendon with mild atrophy of the supraspinatus.  He has moderate AC joint arthropathy and subacromial and subdeltoid bursitis.  At this point we discussed a repeat subacromial injection and physical therapy versus arthroscopic debridement and repair of the rotator cuff as indicated.  Patient will think about this and let us know.  Follow-Up Instructions: Return if symptoms worsen or fail to improve.   Orders:  No orders of the defined types were placed in this encounter.  No orders of the defined types were placed in this encounter.     Procedures: No procedures performed   Clinical Data: No additional findings.   Subjective: Chief Complaint  Patient presents with  . Right Shoulder - Follow-up    MRI review    MRI review of his right shoulder.  Previous subacromial injection helped.  He has pain with sleeping.   Review of Systems   Objective: Vital Signs: There were no vitals taken for this visit.  Physical Exam  Ortho Exam Right shoulder exam is stable. Specialty Comments:  No specialty comments available.  Imaging: Dg Eye Foreign Body  Result Date: 09/28/2017 CLINICAL DATA:  Metal working/exposure; clearance prior to MRI. 62 year old male. EXAM: ORBITS FOR FOREIGN BODY - 2 VIEW COMPARISON:  None. FINDINGS: There is no evidence of metallic foreign body within the orbits. No significant bone abnormality identified. IMPRESSION: No evidence of metallic  foreign body within the orbits. Electronically Signed   By: Genevie Ann M.D.   On: 09/28/2017 15:25   Mr Shoulder Right Wo Contrast  Result Date: 09/28/2017 CLINICAL DATA:  Two months of right shoulder pain with weakness and numbness in both arms. EXAM: MRI OF THE RIGHT SHOULDER WITHOUT CONTRAST TECHNIQUE: Multiplanar, multisequence MR imaging of the shoulder was performed. No intravenous contrast was administered. COMPARISON:  None. FINDINGS: Rotator cuff: Partial articular surface tear involving the anterior fibers of the supraspinatus, series 5/16 and series 6/10. Associated tendinosis of the myotendinous junction of the supraspinatus. The infraspinatus, subscapularis and teres minor appear intact. Muscles: Mild atrophy of the supraspinatus. Biceps long head:  Intact Acromioclavicular Joint: Mild-to-moderate arthropathy of the acromioclavicular joint with joint space narrowing and spurring. Type I flat shaped acromion. Small amount of subacromial and subdeltoid bursal fluid consistent bursitis. Glenohumeral Joint: No joint effusion. No chondral defect. Labrum:  Intact Bones: Subchondral cystic change of the anteromedial and superolateral humeral head. Other: None IMPRESSION: 1. Partial articular surface tear of the anterior fibers of the supraspinatus. 2. Moderate AC joint osteoarthritis with associated subacromial and subdeltoid bursal edema. 3. No labral tear is identified. Electronically Signed   By: Ashley Royalty M.D.   On: 09/28/2017 16:23     PMFS History: Patient Active Problem List   Diagnosis Date Noted  . Nontraumatic tear of right supraspinatus tendon 09/29/2017  . Cubital tunnel syndrome on  left 05/13/2017  . Avascular necrosis of right femoral head ([place]) 06/07/2012    Class: Diagnosis of  . HTN (hypertension) 07/08/2010  . Hyperlipemia 07/08/2010  . Prostatitis 07/08/2010  . Degenerative disc disease 07/08/2010  . Hydrocele of testis 07/08/2010  . DYSLIPIDEMIA 09/08/2009  . OVERWEIGHT  09/08/2009  . HYPERTENSION 09/08/2009  . DIZZINESS 09/08/2009   Past Medical History:  Diagnosis Date  . Arthritis   . Bronchitis, allergic   . Dizziness   . Dyslipidemia   . GERD (gastroesophageal reflux disease)   . HTN (hypertension)   . Hypertension   . OSA (obstructive sleep apnea)   . Overweight(278.02)     No family history on file.  Past Surgical History:  Procedure Laterality Date  . CARDIAC CATHETERIZATION    . CYST EXCISION Right    cyst removed from arm  . ELBOW SURGERY Left   . HIP ARTHROPLASTY Right   . KIDNEY DONATION Left   . KNEE ARTHROSCOPY  2013  . lft ulnar nerve removed     decompression  . 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  . TOTAL HIP ARTHROPLASTY Right 06/08/2012  . TOTAL HIP ARTHROPLASTY Right 06/07/2012   Procedure: TOTAL HIP ARTHROPLASTY ANTERIOR APPROACH;  Surgeon: Marybelle Killings, MD;  Location: [facility];  Service: Orthopedics;  Laterality: Right;  Right Total Hip Arthroplasty-Anterior Approach   Social History   Occupational History  . Not on file  Tobacco Use  . Smoking status: Former Smoker    Packs/day: 1.00    Years: 40.00    Pack years: 40.00    Types: Cigarettes    Last attempt to quit: 04/22/1998    Years since quitting: 19.4  . Smokeless tobacco: Never Used  Substance and Sexual Activity  . Alcohol use: No  . Drug use: No  . Sexual activity: Not on file

## 2017-10-03 ENCOUNTER — Telehealth (INDEPENDENT_AMBULATORY_CARE_PROVIDER_SITE_OTHER): Payer: Self-pay | Admitting: Orthopaedic Surgery

## 2017-10-03 NOTE — Telephone Encounter (Signed)
Patient called w/concern that no one has called him back in regards to surgery. Wanted to know if he is still having it.  Please call patient to advise.Offered to transfer to [facility] stated he has not received a call back after 4 attempts

## 2017-10-03 NOTE — Telephone Encounter (Signed)
If scheduling surgery, please write order.  Thanks!

## 2017-10-03 NOTE — Telephone Encounter (Signed)
Ok shoulder scope extensive deb, DCE, SAD, possible RCR. General and block.  [facility] or cone day. [id number], [id number], [id number], [id number]. Thanks.

## 2017-10-04 NOTE — Telephone Encounter (Signed)
I called patient and scheduled surgery. 

## 2017-10-05 ENCOUNTER — Telehealth (INDEPENDENT_AMBULATORY_CARE_PROVIDER_SITE_OTHER): Payer: Self-pay | Admitting: Orthopaedic Surgery

## 2017-10-05 NOTE — Telephone Encounter (Signed)
Patient called advised his employer need the note fax to them. ([employer])  The fax # is [phone]  The number to contact patient is [phone]

## 2017-10-05 NOTE — Telephone Encounter (Signed)
Patient requesting a note for work just stating that he will be having surgery. He will pick up, preferably today CB # [phone]

## 2017-10-05 NOTE — Telephone Encounter (Signed)
faxed

## 2017-10-05 NOTE — Telephone Encounter (Signed)
Letter written and IC patient to advise, LMVM.

## 2017-10-07 ENCOUNTER — Other Ambulatory Visit (INDEPENDENT_AMBULATORY_CARE_PROVIDER_SITE_OTHER): Payer: Self-pay

## 2017-10-07 ENCOUNTER — Encounter (HOSPITAL_BASED_OUTPATIENT_CLINIC_OR_DEPARTMENT_OTHER): Payer: Self-pay | Admitting: *Deleted

## 2017-10-10 ENCOUNTER — Encounter (HOSPITAL_BASED_OUTPATIENT_CLINIC_OR_DEPARTMENT_OTHER)
Admission: RE | Admit: 2017-10-10 | Discharge: 2017-10-10 | Disposition: A | Discharge status: Home / Self Care | Payer: BLUE CROSS/BLUE SHIELD | Source: Ambulatory Visit | Attending: Orthopaedic Surgery | Admitting: Orthopaedic Surgery

## 2017-10-10 DIAGNOSIS — Z01810 Encounter for preprocedural cardiovascular examination: Secondary | ICD-10-CM | POA: Insufficient documentation

## 2017-10-10 DIAGNOSIS — I10 Essential (primary) hypertension: Secondary | ICD-10-CM | POA: Diagnosis not present

## 2017-10-14 ENCOUNTER — Ambulatory Visit (HOSPITAL_BASED_OUTPATIENT_CLINIC_OR_DEPARTMENT_OTHER): Payer: BLUE CROSS/BLUE SHIELD | Admitting: Anesthesiology

## 2017-10-14 ENCOUNTER — Other Ambulatory Visit: Payer: Self-pay

## 2017-10-14 ENCOUNTER — Ambulatory Visit (HOSPITAL_BASED_OUTPATIENT_CLINIC_OR_DEPARTMENT_OTHER)
Admission: RE | Admit: 2017-10-14 | Discharge: 2017-10-14 | Disposition: A | Discharge status: Home / Self Care | Payer: BLUE CROSS/BLUE SHIELD | Source: Ambulatory Visit | Attending: Orthopaedic Surgery | Admitting: Orthopaedic Surgery

## 2017-10-14 ENCOUNTER — Encounter (HOSPITAL_BASED_OUTPATIENT_CLINIC_OR_DEPARTMENT_OTHER): Payer: Self-pay | Admitting: Anesthesiology

## 2017-10-14 ENCOUNTER — Encounter (HOSPITAL_BASED_OUTPATIENT_CLINIC_OR_DEPARTMENT_OTHER)
Admission: RE | Disposition: A | Discharge status: Home / Self Care | Payer: Self-pay | Source: Ambulatory Visit | Attending: Orthopaedic Surgery

## 2017-10-14 DIAGNOSIS — M67911 Unspecified disorder of synovium and tendon, right shoulder: Secondary | ICD-10-CM

## 2017-10-14 DIAGNOSIS — K219 Gastro-esophageal reflux disease without esophagitis: Secondary | ICD-10-CM | POA: Diagnosis not present

## 2017-10-14 DIAGNOSIS — Z6835 Body mass index (BMI) 35.0-35.9, adult: Secondary | ICD-10-CM | POA: Diagnosis not present

## 2017-10-14 DIAGNOSIS — I10 Essential (primary) hypertension: Secondary | ICD-10-CM | POA: Diagnosis not present

## 2017-10-14 DIAGNOSIS — M65811 Other synovitis and tenosynovitis, right shoulder: Secondary | ICD-10-CM | POA: Diagnosis not present

## 2017-10-14 DIAGNOSIS — M94211 Chondromalacia, right shoulder: Secondary | ICD-10-CM | POA: Diagnosis not present

## 2017-10-14 DIAGNOSIS — X58XXXA Exposure to other specified factors, initial encounter: Secondary | ICD-10-CM | POA: Diagnosis not present

## 2017-10-14 DIAGNOSIS — E663 Overweight: Secondary | ICD-10-CM | POA: Insufficient documentation

## 2017-10-14 DIAGNOSIS — S43431A Superior glenoid labrum lesion of right shoulder, initial encounter: Secondary | ICD-10-CM | POA: Diagnosis not present

## 2017-10-14 DIAGNOSIS — Z888 Allergy status to other drugs, medicaments and biological substances status: Secondary | ICD-10-CM | POA: Diagnosis not present

## 2017-10-14 DIAGNOSIS — S42431A Displaced fracture (avulsion) of lateral epicondyle of right humerus, initial encounter for closed fracture: Secondary | ICD-10-CM | POA: Diagnosis not present

## 2017-10-14 DIAGNOSIS — M7581 Other shoulder lesions, right shoulder: Secondary | ICD-10-CM | POA: Diagnosis present

## 2017-10-14 DIAGNOSIS — M7541 Impingement syndrome of right shoulder: Secondary | ICD-10-CM

## 2017-10-14 DIAGNOSIS — Z87891 Personal history of nicotine dependence: Secondary | ICD-10-CM | POA: Diagnosis not present

## 2017-10-14 DIAGNOSIS — M19011 Primary osteoarthritis, right shoulder: Secondary | ICD-10-CM

## 2017-10-14 DIAGNOSIS — E785 Hyperlipidemia, unspecified: Secondary | ICD-10-CM | POA: Insufficient documentation

## 2017-10-14 HISTORY — PX: SHOULDER ARTHROSCOPY WITH ROTATOR CUFF REPAIR AND SUBACROMIAL DECOMPRESSION: SHX5686

## 2017-10-14 SURGERY — SHOULDER ARTHROSCOPY WITH ROTATOR CUFF REPAIR AND SUBACROMIAL DECOMPRESSION
Anesthesia: General | Site: Shoulder | Laterality: Right

## 2017-10-14 MED ORDER — DEXAMETHASONE SODIUM PHOSPHATE 10 MG/ML IJ SOLN
INTRAMUSCULAR | Status: AC
  Filled 2017-10-14: qty 1

## 2017-10-14 MED ORDER — SUGAMMADEX SODIUM 200 MG/2ML IV SOLN
INTRAVENOUS | Status: DC | PRN
  Administered 2017-10-14: 200 mg via INTRAVENOUS

## 2017-10-14 MED ORDER — LACTATED RINGERS IV SOLN
INTRAVENOUS | Status: DC
  Administered 2017-10-14: 12:00:00 via INTRAVENOUS

## 2017-10-14 MED ORDER — BUPIVACAINE-EPINEPHRINE 0.25% -1:200000 IJ SOLN
INTRAMUSCULAR | Status: DC | PRN
  Administered 2017-10-14: 20 mL

## 2017-10-14 MED ORDER — SUGAMMADEX SODIUM 200 MG/2ML IV SOLN
INTRAVENOUS | Status: AC
  Filled 2017-10-14: qty 2

## 2017-10-14 MED ORDER — ROCURONIUM BROMIDE 100 MG/10ML IV SOLN
INTRAVENOUS | Status: DC | PRN
  Administered 2017-10-14: 50 mg via INTRAVENOUS

## 2017-10-14 MED ORDER — BUPIVACAINE-EPINEPHRINE (PF) 0.5% -1:200000 IJ SOLN
INTRAMUSCULAR | Status: DC | PRN
  Administered 2017-10-14: 30 mL

## 2017-10-14 MED ORDER — FENTANYL CITRATE (PF) 100 MCG/2ML IJ SOLN
INTRAMUSCULAR | Status: AC
  Filled 2017-10-14: qty 2

## 2017-10-14 MED ORDER — CEFAZOLIN SODIUM-DEXTROSE 2-4 GM/100ML-% IV SOLN
2.0000 g | INTRAVENOUS | Status: AC
  Administered 2017-10-14: 2 g via INTRAVENOUS

## 2017-10-14 MED ORDER — PROPOFOL 10 MG/ML IV BOLUS
INTRAVENOUS | Status: DC | PRN
  Administered 2017-10-14: 200 mg via INTRAVENOUS

## 2017-10-14 MED ORDER — GLYCOPYRROLATE 0.2 MG/ML IJ SOLN
INTRAMUSCULAR | Status: DC | PRN
  Administered 2017-10-14: 0.2 mg via INTRAVENOUS

## 2017-10-14 MED ORDER — OXYCODONE-ACETAMINOPHEN 5-325 MG PO TABS: 1.0000 | ORAL_TABLET | ORAL | 0 refills | Status: DC | PRN

## 2017-10-14 MED ORDER — CHLORHEXIDINE GLUCONATE 4 % EX LIQD: 60.0000 mL | Freq: Once | CUTANEOUS | Status: DC

## 2017-10-14 MED ORDER — ONDANSETRON HCL 4 MG/2ML IJ SOLN
INTRAMUSCULAR | Status: AC
  Filled 2017-10-14: qty 2

## 2017-10-14 MED ORDER — DEXAMETHASONE SODIUM PHOSPHATE 4 MG/ML IJ SOLN
INTRAMUSCULAR | Status: DC | PRN
  Administered 2017-10-14: 10 mg via INTRAVENOUS

## 2017-10-14 MED ORDER — SCOPOLAMINE 1 MG/3DAYS TD PT72: 1.0000 | MEDICATED_PATCH | Freq: Once | TRANSDERMAL | Status: DC | PRN

## 2017-10-14 MED ORDER — ONDANSETRON HCL 4 MG PO TABS: 4.0000 mg | ORAL_TABLET | Freq: Three times a day (TID) | ORAL | 0 refills | Status: DC | PRN

## 2017-10-14 MED ORDER — FENTANYL CITRATE (PF) 100 MCG/2ML IJ SOLN
25.0000 ug | INTRAMUSCULAR | Status: DC | PRN
  Administered 2017-10-14: 25 ug via INTRAVENOUS
  Administered 2017-10-14: 50 ug via INTRAVENOUS
  Administered 2017-10-14: 25 ug via INTRAVENOUS

## 2017-10-14 MED ORDER — MIDAZOLAM HCL 2 MG/2ML IJ SOLN
1.0000 mg | INTRAMUSCULAR | Status: DC | PRN
  Administered 2017-10-14: 1 mg via INTRAVENOUS

## 2017-10-14 MED ORDER — ARTIFICIAL TEARS OPHTHALMIC OINT
TOPICAL_OINTMENT | OPHTHALMIC | Status: AC
  Filled 2017-10-14: qty 3.5

## 2017-10-14 MED ORDER — LIDOCAINE HCL (CARDIAC) PF 100 MG/5ML IV SOSY
PREFILLED_SYRINGE | INTRAVENOUS | Status: DC | PRN
  Administered 2017-10-14: 80 mg via INTRAVENOUS

## 2017-10-14 MED ORDER — LACTATED RINGERS IV SOLN
INTRAVENOUS | Status: DC
  Administered 2017-10-14: 11:00:00 via INTRAVENOUS

## 2017-10-14 MED ORDER — CEFAZOLIN SODIUM-DEXTROSE 2-4 GM/100ML-% IV SOLN
INTRAVENOUS | Status: AC
  Filled 2017-10-14: qty 100

## 2017-10-14 MED ORDER — METHOCARBAMOL 750 MG PO TABS: 750.0000 mg | ORAL_TABLET | Freq: Two times a day (BID) | ORAL | 0 refills | Status: DC | PRN

## 2017-10-14 MED ORDER — BUPIVACAINE-EPINEPHRINE (PF) 0.25% -1:200000 IJ SOLN
INTRAMUSCULAR | Status: AC
  Filled 2017-10-14: qty 30

## 2017-10-14 MED ORDER — MEPERIDINE HCL 25 MG/ML IJ SOLN: 6.2500 mg | INTRAMUSCULAR | Status: DC | PRN

## 2017-10-14 MED ORDER — PROPOFOL 500 MG/50ML IV EMUL
INTRAVENOUS | Status: AC
  Filled 2017-10-14: qty 50

## 2017-10-14 MED ORDER — GLYCOPYRROLATE PF 0.2 MG/ML IJ SOSY
PREFILLED_SYRINGE | INTRAMUSCULAR | Status: AC
  Filled 2017-10-14: qty 1

## 2017-10-14 MED ORDER — MIDAZOLAM HCL 2 MG/2ML IJ SOLN
INTRAMUSCULAR | Status: AC
  Filled 2017-10-14: qty 2

## 2017-10-14 MED ORDER — PROMETHAZINE HCL 25 MG/ML IJ SOLN: 6.2500 mg | INTRAMUSCULAR | Status: DC | PRN

## 2017-10-14 MED ORDER — ROCURONIUM BROMIDE 10 MG/ML (PF) SYRINGE
PREFILLED_SYRINGE | INTRAVENOUS | Status: AC
  Filled 2017-10-14: qty 10

## 2017-10-14 MED ORDER — EPINEPHRINE 30 MG/30ML IJ SOLN
INTRAMUSCULAR | Status: AC
  Filled 2017-10-14: qty 1

## 2017-10-14 MED ORDER — FENTANYL CITRATE (PF) 100 MCG/2ML IJ SOLN
50.0000 ug | INTRAMUSCULAR | Status: DC | PRN
  Administered 2017-10-14 (×2): 50 ug via INTRAVENOUS

## 2017-10-14 MED ORDER — LIDOCAINE HCL (CARDIAC) PF 100 MG/5ML IV SOSY
PREFILLED_SYRINGE | INTRAVENOUS | Status: AC
  Filled 2017-10-14: qty 5

## 2017-10-14 SURGICAL SUPPLY — 67 items
ANCH SUT SWLK 24.5 SLF PNCH VT (Anchor) ×1 IMPLANT
ANCHOR BIOCOMP SWIVELOCK (Anchor) ×1 IMPLANT
APL SKNCLS STERI-STRIP NONHPOA (GAUZE/BANDAGES/DRESSINGS)
BENZOIN TINCTURE PRP APPL 2/3 (GAUZE/BANDAGES/DRESSINGS) IMPLANT
BLADE 4.2CUDA (BLADE) ×2 IMPLANT
BLADE CUTTER GATOR 3.5 (BLADE) IMPLANT
BLADE GREAT WHITE 4.2 (BLADE) IMPLANT
BLADE SURG 15 STRL LF DISP TIS (BLADE) IMPLANT
BLADE SURG 15 STRL SS (BLADE)
BUR OVAL 4.0 (BURR) ×2 IMPLANT
CANNULA 5.75X71 LONG (CANNULA) ×2 IMPLANT
CANNULA TWIST IN 8.25X7CM (CANNULA) IMPLANT
CANNULA TWIST IN 8.25X9CM (CANNULA) IMPLANT
CLSR STERI-STRIP ANTIMIC 1/2X4 (GAUZE/BANDAGES/DRESSINGS) IMPLANT
DECANTER SPIKE VIAL GLASS SM (MISCELLANEOUS) IMPLANT
DRAPE IMP U-DRAPE 54X76 (DRAPES) ×2 IMPLANT
DRAPE INCISE IOBAN 66X45 STRL (DRAPES) ×2 IMPLANT
DRAPE STERI 35X30 U-POUCH (DRAPES) ×2 IMPLANT
DRAPE SURG 17X23 STRL (DRAPES) ×2 IMPLANT
DRAPE U-SHAPE 47X51 STRL (DRAPES) ×2 IMPLANT
DRAPE U-SHAPE 76X120 STRL (DRAPES) ×4 IMPLANT
DRSG PAD ABDOMINAL 8X10 ST (GAUZE/BANDAGES/DRESSINGS) ×4 IMPLANT
DURAPREP 26ML APPLICATOR (WOUND CARE) ×4 IMPLANT
ELECT REM PT RETURN 9FT ADLT (ELECTROSURGICAL)
ELECTRODE REM PT RTRN 9FT ADLT (ELECTROSURGICAL) IMPLANT
GAUZE SPONGE 4X4 12PLY STRL (GAUZE/BANDAGES/DRESSINGS) ×4 IMPLANT
GAUZE XEROFORM 1X8 LF (GAUZE/BANDAGES/DRESSINGS) ×2 IMPLANT
GLOVE BIOGEL PI IND STRL 7.0 (GLOVE) ×1 IMPLANT
GLOVE BIOGEL PI INDICATOR 7.0 (GLOVE) ×1
GLOVE ECLIPSE 7.0 STRL STRAW (GLOVE) ×2 IMPLANT
GLOVE SKINSENSE NS SZ7.5 (GLOVE) ×1
GLOVE SKINSENSE STRL SZ7.5 (GLOVE) ×1 IMPLANT
GLOVE SURG SYN 7.5  E (GLOVE) ×1
GLOVE SURG SYN 7.5 E (GLOVE) ×1 IMPLANT
GLOVE SURG SYN 7.5 PF PI (GLOVE) ×1 IMPLANT
GOWN STRL REIN XL XLG (GOWN DISPOSABLE) ×2 IMPLANT
GOWN STRL REUS W/ TWL LRG LVL3 (GOWN DISPOSABLE) ×1 IMPLANT
GOWN STRL REUS W/ TWL XL LVL3 (GOWN DISPOSABLE) ×1 IMPLANT
GOWN STRL REUS W/TWL LRG LVL3 (GOWN DISPOSABLE) ×2
GOWN STRL REUS W/TWL XL LVL3 (GOWN DISPOSABLE) ×2
IMMOBILIZER SHOULDER FOAM XLGE (SOFTGOODS) IMPLANT
KIT SHOULDER TRACTION (DRAPES) ×2 IMPLANT
MANIFOLD NEPTUNE II (INSTRUMENTS) ×2 IMPLANT
NDL SCORPION MULTI FIRE (NEEDLE) IMPLANT
NEEDLE SCORPION MULTI FIRE (NEEDLE) IMPLANT
PACK ARTHROSCOPY DSU (CUSTOM PROCEDURE TRAY) ×2 IMPLANT
PACK BASIN DAY SURGERY FS (CUSTOM PROCEDURE TRAY) ×2 IMPLANT
PASSER SUT SWIFTSTITCH HIP CRT (INSTRUMENTS) ×1 IMPLANT
PROBE BIPOLAR ATHRO 135MM 90D (MISCELLANEOUS) ×2 IMPLANT
SHEET MEDIUM DRAPE 40X70 STRL (DRAPES) ×2 IMPLANT
SLEEVE SCD COMPRESS KNEE MED (MISCELLANEOUS) ×2 IMPLANT
SLING ARM FOAM STRAP LRG (SOFTGOODS) IMPLANT
SLING ARM IMMOBILIZER LRG (SOFTGOODS) IMPLANT
SLING ARM IMMOBILIZER MED (SOFTGOODS) IMPLANT
SLING ARM MED ADULT FOAM STRAP (SOFTGOODS) IMPLANT
SLING ARM XL FOAM STRAP (SOFTGOODS) ×1 IMPLANT
SUT ETHILON 3 0 PS 1 (SUTURE) ×2 IMPLANT
SUT FIBERWIRE #2 38 T-5 BLUE (SUTURE)
SUT TIGER TAPE 7 IN WHITE (SUTURE) IMPLANT
SUTURE FIBERWR #2 38 T-5 BLUE (SUTURE) IMPLANT
SYR 50ML LL SCALE MARK (SYRINGE) IMPLANT
TAPE FIBER 2MM 7IN #2 BLUE (SUTURE) IMPLANT
TOWEL GREEN STERILE FF (TOWEL DISPOSABLE) ×2 IMPLANT
TOWEL OR NON WOVEN STRL DISP B (DISPOSABLE) ×2 IMPLANT
TUBE CONNECTING 20X1/4 (TUBING) IMPLANT
TUBING ARTHRO INFLOW-ONLY STRL (TUBING) ×2 IMPLANT
WATER STERILE IRR 1000ML POUR (IV SOLUTION) ×2 IMPLANT

## 2017-10-14 NOTE — Op Note (Signed)
   Date of Surgery: 10/14/2017  INDICATIONS: The patient is a 62 year old male with right shoulder pain that has failed conservative treatment;  The patient did consent to the procedure after discussion of the risks and benefits.  PREOPERATIVE DIAGNOSIS:  1.  Right shoulder rotator cuff tendinosis 2.  Right shoulder degenerative SLAP tear 3.  Right shoulder glenohumeral chondromalacia 4.  Right shoulder impingement syndrome 5.  Right shoulder AC joint arthrosis  POSTOPERATIVE DIAGNOSIS: Same.  PROCEDURE:  1.  Right shoulder arthroscopic distal clavicle excision 2.  Right shoulder arthroscopic extensive debridement of rotator cuff, labrum, labrum, synovitis 3.  Right shoulder arthroscopic biceps tenodesis 4.  Right shoulder arthroscopic acromioplasty with subacromial decompression.  SURGEON: N. Eduard Roux, M.D.  ASSIST: Ciro Backer [place], [place]; necessary for the timely completion of procedure and due to complexity of procedure..  ANESTHESIA:  general, regional  IV FLUIDS AND URINE: See anesthesia.  ESTIMATED BLOOD LOSS: minimal mL.  IMPLANTS: Arthrex 4.75 mm self punching bio composite anchor  COMPLICATIONS: None.  DESCRIPTION OF PROCEDURE: The patient was brought to the operating room and placed supine on the operating table.  The patient had been signed prior to the procedure and this was documented. The patient had the anesthesia placed by the anesthesiologist.  A time-out was performed to confirm that this was the correct patient, site, side and location. The patient did receive antibiotics prior to the incision and was re-dosed during the procedure as needed at indicated intervals.  The patient was then moved into the lateral position with the operative extremity suspended in the fishing pole mechanism. The patient had the operative extremity prepped and draped in the standard surgical fashion.    Incisions were made for standard anterior and posterior shoulder arthroscopy  portals.  We first performed a diagnostic shoulder arthroscopy.  This exhibited degenerative labral tearing mainly of the superior and anterior portions.  There was some mild chondromalacia of the glenohumeral surface for which chondroplasty was performed.  The articular surface of the rotator cuff did exhibit some mild tendinosis but no full-thickness tears.  This was gently debrided.  I then performed an arthroscopic biceps tenodesis using the Arthrex loop intact technique.  The biceps was tenodesed in the bicipital groove just superior to the subscapularis tendon.  The biceps anchor was then debrided back to a stable border.  After this was done we then repositioned the arthroscope into the subacromial space.  A separate lateral portal was then established.  We then performed a subdeltoid and subacromial bursectomy using an oscillating shaver.  Acromioplasty was then performed using a high-speed bur.  We then performed a distal clavicle excision using a high-speed bur taking approximately 8 mm of distal clavicle.  Gentle debridement of the bursal surface of the rotator cuff was performed.  Again this exhibited mild tendinosis without any full-thickness tears.  Excess fluid was then drained from the shoulder and the incisions were closed with interrupted nylon sutures.  Sterile dressings were applied.  Patient tolerated procedure well had no immediate complications.  POSTOPERATIVE PLAN: Follow-up in 2 weeks for suture removal and initiation of physical therapy.  Azucena Cecil, MD [place] 10:16 AM

## 2017-10-14 NOTE — Discharge Instructions (Signed)
Regional Anesthesia Blocks  1. Numbness or the inability to move the "blocked" extremity may last from 3-48 hours after placement. The length of time depends on the medication injected and your individual response to the medication. If the numbness is not going away after 48 hours, call your surgeon.  2. The extremity that is blocked will need to be protected until the numbness is gone and the  Strength has returned. Because you cannot feel it, you will need to take extra care to avoid injury. Because it may be weak, you may have difficulty moving it or using it. You may not know what position it is in without looking at it while the block is in effect.  3. For blocks in the legs and feet, returning to weight bearing and walking needs to be done carefully. You will need to wait until the numbness is entirely gone and the strength has returned. You should be able to move your leg and foot normally before you try and bear weight or walk. You will need someone to be with you when you first try to ensure you do not fall and possibly risk injury.  4. Bruising and tenderness at the needle site are common side effects and will resolve in a few days.  5. Persistent numbness or new problems with movement should be communicated to the surgeon or the [facility] [phone] [place] [phone]).   Post-operative patient instructions  Shoulder Arthroscopy    Ice:  Place intermittent ice or cooler pack over your shoulder, 30 minutes on and 30 minutes off.  Continue this for the first 72 hours after surgery, then save ice for use after therapy sessions or on more active days.    Weight:  You may bear weight on your arm as your symptoms allow.  Motion:  Perform gentle shoulder motion as tolerated  Dressing:  Perform 1st dressing change at 2 days postoperative. A moderate amount of blood tinged drainage is to be expected.  So if you bleed through the dressing on the first or  second day or if you have fevers, it is fine to change the dressing/check the wounds early and redress wound.  If it bleeds through again, or if the incisions are leaking frank blood, please call the office. May change dressing every 1-2 days thereafter to help watch wounds. Can purchase Tegaderm (or 74M Nexcare) water resistant dressings at local pharmacy / [facility].  Shower:  Light shower is ok after 2 days.  Please take shower, NO bath. Recover with gauze and ace wrap to help keep wounds protected.    Pain medication:  A narcotic pain medication has been prescribed.  Take as directed.  Typically you need narcotic pain medication more regularly during the first 3 to 5 days after surgery.  Decrease your use of the medication as the pain improves.  Narcotics can sometimes cause constipation, even after a few doses.  If you have problems with constipation, you can take an over the counter stool softener or light laxative.  If you have persistent problems, please notify your physicians office.  Physical therapy: Additional activity guidelines to be provided by your physician or physical therapist at follow-up visits.   Driving: Do not recommend driving x 2 weeks post surgical, especially if surgery performed on right side. Should not drive while taking narcotic pain medications. It typically takes at least 2 weeks to restore sufficient neuromuscular function for normal reaction times for driving safety.  Call [phone] for questions or problems. Evenings you will be forwarded to the hospital operator.  Ask for the orthopaedic physician on call. Please call if you experience:    o Redness, foul smelling, or persistent drainage from the surgical site  o worsening shoulder pain and swelling not responsive to medication  o any calf pain and or swelling of the lower leg  o temperatures greater than 101.5 F o other questions or concerns   Thank you for allowing [place] to be a part of your  care.        Post Anesthesia Home Care Instructions  Activity: Get plenty of rest for the remainder of the day. A responsible individual must stay with you for 24 hours following the procedure.  For the next 24 hours, DO NOT: -Drive a car -Paediatric nurse -Drink alcoholic beverages -Take any medication unless instructed by your physician -Make any legal decisions or sign important papers.  Meals: Start with liquid foods such as gelatin or soup. Progress to regular foods as tolerated. Avoid greasy, spicy, heavy foods. If nausea and/or vomiting occur, drink only clear liquids until the nausea and/or vomiting subsides. Call your physician if vomiting continues.  Special Instructions/Symptoms: Your throat may feel dry or sore from the anesthesia or the breathing tube placed in your throat during surgery. If this causes discomfort, gargle with warm salt water. The discomfort should disappear within 24 hours.  If you had a scopolamine patch placed behind your ear for the management of post- operative nausea and/or vomiting:  1. The medication in the patch is effective for 72 hours, after which it should be removed.  Wrap patch in a tissue and discard in the trash. Wash hands thoroughly with soap and water. 2. You may remove the patch earlier than 72 hours if you experience unpleasant side effects which may include dry mouth, dizziness or visual disturbances. 3. Avoid touching the patch. Wash your hands with soap and water after contact with the patch.

## 2017-10-14 NOTE — Progress Notes (Signed)
Assisted Dr. Germeroth with right, ultrasound guided, interscalene  block. Side rails up, monitors on throughout procedure. See vital signs in flow sheet. Tolerated Procedure well.  

## 2017-10-14 NOTE — Anesthesia Procedure Notes (Signed)
Anesthesia Regional Block: Interscalene brachial plexus block   Pre-Anesthetic Checklist: ,, timeout performed, Correct Patient, Correct Site, Correct Laterality, Correct Procedure, Correct Position, site marked, Risks and benefits discussed,  Surgical consent,  Pre-op evaluation,  At surgeon's request and post-op pain management  Laterality: Right  Prep: chloraprep       Needles:  Injection technique: Single-shot  Needle Type: Stimulator Needle - 40     Needle Length: 4cm  Needle Gauge: 22     Additional Needles:   Procedures:,,,, ultrasound used (permanent image in chart),,,,  Narrative:  Start time: 10/14/2017 11:40 AM End time: 10/14/2017 11:45 AM Injection made incrementally with aspirations every 5 mL.  Performed by: Personally  Anesthesiologist: Nolon Nations, MD  Additional Notes: BP cuff, EKG monitors applied. Sedation begun. Nerve location verified with U/S. Anesthetic injected incrementally, slowly , and after neg aspirations under direct u/s guidance. Good perineural spread. Tolerated well.

## 2017-10-14 NOTE — Anesthesia Preprocedure Evaluation (Signed)
Anesthesia Evaluation  Patient identified by MRN, date of birth, ID band Patient awake    Reviewed: Allergy & Precautions, H&P , NPO status , Patient's Chart, lab work & pertinent test results  Airway Mallampati: III  TM Distance: >3 FB Neck ROM: Full    Dental no notable dental hx. (+) Teeth Intact, Dental Advisory Given   Pulmonary neg sleep apnea, former smoker,    Pulmonary exam normal breath sounds clear to auscultation       Cardiovascular hypertension, On Medications  Rhythm:Regular Rate:Normal     Neuro/Psych negative neurological ROS  negative psych ROS   GI/Hepatic Neg liver ROS, GERD  Controlled and Medicated,  Endo/Other  negative endocrine ROS  Renal/GU negative Renal ROS  negative genitourinary   Musculoskeletal  (+) Arthritis ,   Abdominal   Peds  Hematology negative hematology ROS (+)   Anesthesia Other Findings   Reproductive/Obstetrics                            Anesthesia Physical  Anesthesia Plan  ASA: II  Anesthesia Plan: General   Post-op Pain Management:  Regional for Post-op pain   Induction: Intravenous  PONV Risk Score and Plan: 2 and Ondansetron, Dexamethasone, Treatment may vary due to age or medical condition and Midazolam  Airway Management Planned: Oral ETT  Additional Equipment:   Intra-op Plan:   Post-operative Plan: Extubation in OR  Informed Consent: I have reviewed the patients History and Physical, chart, labs and discussed the procedure including the risks, benefits and alternatives for the proposed anesthesia with the patient or authorized representative who has indicated his/her understanding and acceptance.   Dental advisory given  Plan Discussed with: CRNA  Anesthesia Plan Comments:         Anesthesia Quick Evaluation

## 2017-10-14 NOTE — Anesthesia Postprocedure Evaluation (Signed)
Anesthesia Post Note  Patient: Blake Solis  Procedure(s) Performed: RIGHT SHOULDER ARTHROSCOPY WITH EXTENSIVE DEBRIDEMENT, SUBACROMIAL DECOMPRESSION, DISTAL CLAVICLE EXCISION AND BICEPS TENODYSIS (Right Shoulder)     Patient location during evaluation: PACU Anesthesia Type: General Level of consciousness: sedated and patient cooperative Pain management: pain level controlled Vital Signs Assessment: post-procedure vital signs reviewed and stable Respiratory status: spontaneous breathing Cardiovascular status: stable Anesthetic complications: no    Last Vitals:  Vitals:   10/14/17 1415 10/14/17 1430  BP: (!) 147/82 (!) 157/89  Pulse: (!) 58 (!) 57  Resp: 14 18  Temp:    SpO2: 99% 94%    Last Pain:  Vitals:   10/14/17 1430  TempSrc:   PainSc: [place]

## 2017-10-14 NOTE — H&P (Signed)
PREOPERATIVE H&P  Chief Complaint: right shoulder rotator cuff tear, impingement and Acromioclavicular joint arthrosis  HPI: Blake Solis is a 62 y.o. male who presents for surgical treatment of right shoulder rotator cuff tear, impingement and Acromioclavicular joint arthrosis.  He denies any changes in medical history.  Past Medical History:  Diagnosis Date  . Arthritis   . Bronchitis, allergic   . Dizziness   . Dyslipidemia   . GERD (gastroesophageal reflux disease)   . HTN (hypertension)   . Hypertension   . Overweight(278.02)    Past Surgical History:  Procedure Laterality Date  . CARDIAC CATHETERIZATION    . CYST EXCISION Right    cyst removed from arm  . ELBOW SURGERY Left   . HIP ARTHROPLASTY Right   . KIDNEY DONATION Left   . KNEE ARTHROSCOPY  2013  . lft ulnar nerve removed     decompression  . 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  . TOTAL HIP ARTHROPLASTY Right 06/08/2012  . TOTAL HIP ARTHROPLASTY Right 06/07/2012   Procedure: TOTAL HIP ARTHROPLASTY ANTERIOR APPROACH;  Surgeon: Marybelle Killings, MD;  Location: [place];  Service: Orthopedics;  Laterality: Right;  Right Total Hip Arthroplasty-Anterior Approach   Social History   Socioeconomic History  . Marital status: Married    Spouse name: Not on file  . Number of children: Not on file  . Years of education: Not on file  . Highest education level: Not on file  Occupational History  . Not on file  Social Needs  . Financial resource strain: Not on file  . Food insecurity:    Worry: Not on file    Inability: Not on file  . Transportation needs:    Medical: Not on file    Non-medical: Not on file  Tobacco Use  . Smoking status: Former Smoker    Packs/day: 1.00    Years: 40.00    Pack years: 40.00    Types: Cigarettes    Last attempt to quit: 04/22/1998    Years since quitting: 19.4  . Smokeless tobacco: Never Used  Substance and Sexual Activity  . Alcohol use: No  . Drug use:  No  . Sexual activity: Not on file  Lifestyle  . Physical activity:    Days per week: Not on file    Minutes per session: Not on file  . Stress: Not on file  Relationships  . Social connections:    Talks on phone: Not on file    Gets together: Not on file    Attends religious service: Not on file    Active member of club or organization: Not on file    Attends meetings of clubs or organizations: Not on file    Relationship status: Not on file  Other Topics Concern  . Not on file  Social History Narrative   ** Merged History Encounter **       History reviewed. No pertinent family history. Allergies  Allergen Reactions  . Meloxicam Nausea Only   Prior to Admission medications   Medication Sig Start Date End Date Taking? Authorizing Provider  amLODipine-benazepril (LOTREL) 5-40 MG capsule Take 1 capsule by mouth daily. 08/22/17  Yes [provider]     Positive ROS: All other systems have been reviewed and were otherwise negative with the exception of those mentioned in the HPI and as above.  Physical Exam: General: Alert, no acute distress Cardiovascular: No pedal edema Respiratory:  No cyanosis, no use of accessory musculature GI: abdomen soft Skin: No lesions in the area of chief complaint Neurologic: Sensation intact distally Psychiatric: Patient is competent for consent with normal mood and affect Lymphatic: no lymphedema  MUSCULOSKELETAL: exam stable  Assessment: right shoulder rotator cuff tear, impingement and Acromioclavicular joint arthrosis  Plan: Plan for Procedure(s): RIGHT SHOULDER ARTHROSCOPY WITH EXTENSIVE DEBRIDEMENT, SUBACROMIAL DECOMPRESSION, DISTAL CLAVICLE EXCISION AND POSSIBLE ROTATOR CUFF REPAIR  The risks benefits and alternatives were discussed with the patient including but not limited to the risks of nonoperative treatment, versus surgical intervention including infection, bleeding, nerve injury,  blood clots, cardiopulmonary  complications, morbidity, mortality, among others, and they were willing to proceed.   Eduard Roux, MD   10/14/2017 11:24 AM

## 2017-10-14 NOTE — Transfer of Care (Signed)
Immediate Anesthesia Transfer of Care Note  Patient: Blake Solis  Procedure(s) Performed: RIGHT SHOULDER ARTHROSCOPY WITH EXTENSIVE DEBRIDEMENT, SUBACROMIAL DECOMPRESSION, DISTAL CLAVICLE EXCISION AND BICEPS TENODYSIS (Right Shoulder)  Patient Location: PACU  Anesthesia Type:GA combined with regional for post-op pain  Level of Consciousness: sedated and responds to stimulation  Airway & Oxygen Therapy: Patient Spontanous Breathing and Patient connected to face mask oxygen  Post-op Assessment: Report given to RN and Post -op Vital signs reviewed and stable  Post vital signs: Reviewed and stable  Last Vitals:  Vitals Value Taken Time  BP 141/76 10/14/2017  1:48 PM  Temp    Pulse 59 10/14/2017  1:50 PM  Resp 12 10/14/2017  1:50 PM  SpO2 97 % 10/14/2017  1:50 PM  Vitals shown include unvalidated device data.  Last Pain:  Vitals:   10/14/17 1019  TempSrc: Oral  PainSc: 5       Patients Stated Pain Goal: 2 (93/90/30 0923)  Complications: No apparent anesthesia complications

## 2017-10-14 NOTE — Anesthesia Procedure Notes (Signed)
Procedure Name: Intubation Date/Time: 10/14/2017 12:12 PM Performed by: Lyndee Leo, CRNA Pre-anesthesia Checklist: Patient identified, Emergency Drugs available, Suction available and Patient being monitored Patient Re-evaluated:Patient Re-evaluated prior to induction Oxygen Delivery Method: Circle system utilized Preoxygenation: Pre-oxygenation with 100% oxygen Induction Type: IV induction Ventilation: Mask ventilation without difficulty and Oral airway inserted - appropriate to patient size Laryngoscope Size: Glidescope and 4 Grade View: Grade III Tube type: Oral Tube size: 8.0 mm Number of attempts: 1 Airway Equipment and Method: Stylet and Oral airway Placement Confirmation: ETT inserted through vocal cords under direct vision,  positive ETCO2 and breath sounds checked- equal and bilateral Secured at: 22 cm Tube secured with: Tape Dental Injury: Teeth and Oropharynx as per pre-operative assessment

## 2017-10-17 ENCOUNTER — Encounter (HOSPITAL_BASED_OUTPATIENT_CLINIC_OR_DEPARTMENT_OTHER): Payer: Self-pay | Admitting: Orthopaedic Surgery

## 2017-10-25 ENCOUNTER — Inpatient Hospital Stay (INDEPENDENT_AMBULATORY_CARE_PROVIDER_SITE_OTHER): Payer: BLUE CROSS/BLUE SHIELD | Admitting: Physician Assistant

## 2017-11-01 ENCOUNTER — Encounter (INDEPENDENT_AMBULATORY_CARE_PROVIDER_SITE_OTHER): Payer: Self-pay | Admitting: Orthopaedic Surgery

## 2017-11-01 ENCOUNTER — Ambulatory Visit (INDEPENDENT_AMBULATORY_CARE_PROVIDER_SITE_OTHER): Payer: BLUE CROSS/BLUE SHIELD | Admitting: Physician Assistant

## 2017-11-01 DIAGNOSIS — Z9889 Other specified postprocedural states: Secondary | ICD-10-CM | POA: Insufficient documentation

## 2017-11-01 NOTE — Progress Notes (Signed)
Post-Op Visit Note   Patient: Blake Solis           Date of Birth: [birth year]           MRN: [id number] Visit Date: 11/01/2017 PCP: Arsenio Katz, NP   Assessment & Plan:  Chief Complaint:  Chief Complaint  Patient presents with  . Right Shoulder - Routine Post Op   Visit Diagnoses:  1. S/P arthroscopy of right shoulder     Plan: Patient is a pleasant 62 year old gentleman who presents to our clinic today 18 days status post right shoulder arthroscopic debridement and biceps tenodesis, date of surgery 10/14/2017.  He has been doing well since surgery.  Minimal pain.  No fevers, chills or any other systemic symptoms.  He does note that he has a daughter who is a Psychologist, sport and exercise who removed his stitches 1 to 2 weeks ago.  Examination of his right shoulder reveals well-healed surgical portals without evidence of infection.  Full sensation distally.  At this point, we will have him go to formal physical therapy for at least one session for them to lay out a program of exercises for him to do while at home.  We will allow him to return to work tomorrow however he does note he has to occasionally lift to 20 to 25 pound box and he states that he will have somebody to do this for him.  In the meantime, no lifting more than 5 to 10 pounds.  He will follow-up with [place] in 3 weeks time when he 6 weeks out from surgery.  Call with concerns or questions in the meantime.  Follow-Up Instructions: Return in about 3 weeks (around 11/22/2017).   Orders:  No orders of the defined types were placed in this encounter.  No orders of the defined types were placed in this encounter.   Imaging: No results found.  PMFS History: Patient Active Problem List   Diagnosis Date Noted  . S/P arthroscopy of right shoulder 11/01/2017  . Superior glenoid labrum lesion of right shoulder   . Arthrosis of right acromioclavicular joint   . Tendinopathy of rotator cuff, right   . Impingement syndrome of right  shoulder   . Nontraumatic tear of right supraspinatus tendon 09/29/2017  . Cubital tunnel syndrome on left 05/13/2017  . Avascular necrosis of right femoral head ([place]) 06/07/2012    Class: Diagnosis of  . HTN (hypertension) 07/08/2010  . Hyperlipemia 07/08/2010  . Prostatitis 07/08/2010  . Degenerative disc disease 07/08/2010  . Hydrocele of testis 07/08/2010  . DYSLIPIDEMIA 09/08/2009  . OVERWEIGHT 09/08/2009  . HYPERTENSION 09/08/2009  . DIZZINESS 09/08/2009   Past Medical History:  Diagnosis Date  . Arthritis   . Bronchitis, allergic   . Dizziness   . Dyslipidemia   . GERD (gastroesophageal reflux disease)   . HTN (hypertension)   . Hypertension   . Overweight(278.02)     No family history on file.  Past Surgical History:  Procedure Laterality Date  . CARDIAC CATHETERIZATION    . CYST EXCISION Right    cyst removed from arm  . ELBOW SURGERY Left   . HIP ARTHROPLASTY Right   . KIDNEY DONATION Left   . KNEE ARTHROSCOPY  2013  . lft ulnar nerve removed     decompression  . 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  . SHOULDER ARTHROSCOPY WITH ROTATOR CUFF REPAIR AND SUBACROMIAL DECOMPRESSION Right 10/14/2017   Procedure: RIGHT SHOULDER ARTHROSCOPY WITH  EXTENSIVE DEBRIDEMENT, SUBACROMIAL DECOMPRESSION, DISTAL CLAVICLE EXCISION AND BICEPS TENODYSIS;  Surgeon: Leandrew Koyanagi, MD;  Location: [facility];  Service: Orthopedics;  Laterality: Right;  . TOTAL HIP ARTHROPLASTY Right 06/08/2012  . TOTAL HIP ARTHROPLASTY Right 06/07/2012   Procedure: TOTAL HIP ARTHROPLASTY ANTERIOR APPROACH;  Surgeon: Marybelle Killings, MD;  Location: [facility];  Service: Orthopedics;  Laterality: Right;  Right Total Hip Arthroplasty-Anterior Approach   Social History   Occupational History  . Not on file  Tobacco Use  . Smoking status: Former Smoker    Packs/day: 1.00    Years: 40.00    Pack years: 40.00    Types: Cigarettes    Last attempt to quit: 04/22/1998    Years  since quitting: 19.5  . Smokeless tobacco: Never Used  Substance and Sexual Activity  . Alcohol use: No  . Drug use: No  . Sexual activity: Not on file

## 2017-11-11 ENCOUNTER — Telehealth (INDEPENDENT_AMBULATORY_CARE_PROVIDER_SITE_OTHER): Payer: Self-pay

## 2017-11-11 NOTE — Telephone Encounter (Signed)
yes

## 2017-11-11 NOTE — Telephone Encounter (Signed)
Called no answer LMOM.

## 2017-11-11 NOTE — Telephone Encounter (Signed)
See message.

## 2017-11-11 NOTE — Telephone Encounter (Signed)
Patient would like to know if it would be okay if he waits to be seen on Wednesday, 11/16/17.  Stated that he is having right shoulder pain and has some burning.  Cb# is [phone].  Please advise.  Thank You.

## 2017-11-14 ENCOUNTER — Telehealth (INDEPENDENT_AMBULATORY_CARE_PROVIDER_SITE_OTHER): Payer: Self-pay

## 2017-11-14 NOTE — Telephone Encounter (Signed)
ERROR

## 2017-11-14 NOTE — Telephone Encounter (Signed)
Ok he needs to come back to see [place]

## 2017-11-14 NOTE — Telephone Encounter (Signed)
appt made for tomorrow

## 2017-11-14 NOTE — Telephone Encounter (Signed)
Patient called left voicemail on triage phone had SU 10/14/17 right shoulder. Went back to work 2 weeks after SU. Heard a pop, went to [facility] . They did xrays  But they said they didn't show anything and that  he needed an MRI. This would  Be under W/C. Please Advise. Thank you.   CB [phone]

## 2017-11-15 ENCOUNTER — Encounter (INDEPENDENT_AMBULATORY_CARE_PROVIDER_SITE_OTHER): Payer: Self-pay | Admitting: Orthopaedic Surgery

## 2017-11-15 ENCOUNTER — Ambulatory Visit (INDEPENDENT_AMBULATORY_CARE_PROVIDER_SITE_OTHER): Payer: BLUE CROSS/BLUE SHIELD | Admitting: Orthopaedic Surgery

## 2017-11-15 DIAGNOSIS — Z9889 Other specified postprocedural states: Secondary | ICD-10-CM

## 2017-11-15 DIAGNOSIS — M25511 Pain in right shoulder: Secondary | ICD-10-CM

## 2017-11-15 NOTE — Telephone Encounter (Signed)
What? I just saw him today and he's complaining of pain in his shoulder and we're getting an MRI.  I'm confused.

## 2017-11-15 NOTE — Telephone Encounter (Signed)
Please advise 

## 2017-11-15 NOTE — Progress Notes (Signed)
Post-Op Visit Note   Patient: Blake Solis           Date of Birth: [birth year]           MRN: [id number] Visit Date: 11/15/2017 PCP: Arsenio Katz, NP   Assessment & Plan:  Chief Complaint:  Chief Complaint  Patient presents with  . Right Shoulder - Follow-up   Visit Diagnoses:  1. S/P arthroscopy of right shoulder   2. Right shoulder pain, unspecified chronicity     Plan: Margaret is a 32 days status post right shoulder arthroscopy with biceps tenodesis.  He was at work when he picked up a package and felt and heard a pop in his right shoulder with immediate pain.  He was evaluated at an outside hospital and x-rays of the right shoulder were taken which did not show any acute abnormalities.  Based on my evaluation today he has pain in the bicipital groove and pain with forearm supination and with resisted elbow flexion.  Rotator cuff testing appears to be normal.  At this point I am afraid that he may have pulled loose his biceps tenodesis.  I would like to obtain work note was provided today for no lifting more than 5 pounds with the right arm.  Follow-Up Instructions: Return if symptoms worsen or fail to improve.   Orders:  Orders Placed This Encounter  Procedures  . MR SHOULDER RIGHT WO CONTRAST   No orders of the defined types were placed in this encounter.   Imaging: No results found.  PMFS History: Patient Active Problem List   Diagnosis Date Noted  . S/P arthroscopy of right shoulder 11/01/2017  . Superior glenoid labrum lesion of right shoulder   . Arthrosis of right acromioclavicular joint   . Tendinopathy of rotator cuff, right   . Impingement syndrome of right shoulder   . Nontraumatic tear of right supraspinatus tendon 09/29/2017  . Cubital tunnel syndrome on left 05/13/2017  . Avascular necrosis of right femoral head ([place]) 06/07/2012    Class: Diagnosis of  . HTN (hypertension) 07/08/2010  . Hyperlipemia 07/08/2010  . Prostatitis 07/08/2010  .  Degenerative disc disease 07/08/2010  . Hydrocele of testis 07/08/2010  . DYSLIPIDEMIA 09/08/2009  . OVERWEIGHT 09/08/2009  . HYPERTENSION 09/08/2009  . DIZZINESS 09/08/2009   Past Medical History:  Diagnosis Date  . Arthritis   . Bronchitis, allergic   . Dizziness   . Dyslipidemia   . GERD (gastroesophageal reflux disease)   . HTN (hypertension)   . Hypertension   . Overweight(278.02)     No family history on file.  Past Surgical History:  Procedure Laterality Date  . CARDIAC CATHETERIZATION    . CYST EXCISION Right    cyst removed from arm  . ELBOW SURGERY Left   . HIP ARTHROPLASTY Right   . KIDNEY DONATION Left   . KNEE ARTHROSCOPY  2013  . lft ulnar nerve removed     decompression  . 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  . SHOULDER ARTHROSCOPY WITH ROTATOR CUFF REPAIR AND SUBACROMIAL DECOMPRESSION Right 10/14/2017   Procedure: RIGHT SHOULDER ARTHROSCOPY WITH EXTENSIVE DEBRIDEMENT, SUBACROMIAL DECOMPRESSION, DISTAL CLAVICLE EXCISION AND BICEPS TENODYSIS;  Surgeon: Leandrew Koyanagi, MD;  Location: [facility];  Service: Orthopedics;  Laterality: Right;  . TOTAL HIP ARTHROPLASTY Right 06/08/2012  . TOTAL HIP ARTHROPLASTY Right 06/07/2012   Procedure: TOTAL HIP ARTHROPLASTY ANTERIOR APPROACH;  Surgeon: Marybelle Killings, MD;  [facility];  Service: Orthopedics;  Laterality: Right;  Right Total Hip Arthroplasty-Anterior Approach   Social History   Occupational History  . Not on file  Tobacco Use  . Smoking status: Former Smoker    Packs/day: 1.00    Years: 40.00    Pack years: 40.00    Types: Cigarettes    Last attempt to quit: 04/22/1998    Years since quitting: 19.5  . Smokeless tobacco: Never Used  Substance and Sexual Activity  . Alcohol use: No  . Drug use: No  . Sexual activity: Not on file

## 2017-11-15 NOTE — Telephone Encounter (Signed)
Patient called to state that he needed a note to go back to work with no restrictions.  Please call patient to advise. [phone]

## 2017-11-16 ENCOUNTER — Ambulatory Visit (INDEPENDENT_AMBULATORY_CARE_PROVIDER_SITE_OTHER): Payer: BLUE CROSS/BLUE SHIELD | Admitting: Orthopaedic Surgery

## 2017-11-16 ENCOUNTER — Telehealth (INDEPENDENT_AMBULATORY_CARE_PROVIDER_SITE_OTHER): Payer: Self-pay | Admitting: Orthopaedic Surgery

## 2017-11-16 NOTE — Telephone Encounter (Signed)
done

## 2017-11-16 NOTE — Telephone Encounter (Signed)
Patient called would like know if he is out of work until [employer] is complete. Patient would also like to know if Mri will be scheduled in [place]. I advised patient that order is put in and the office will give him a call to schedule his appointment.

## 2017-11-16 NOTE — Telephone Encounter (Signed)
He may work as long as he does not lift more than 5 lbs with his arm

## 2017-11-16 NOTE — Telephone Encounter (Signed)
I tried to call patient .  No answer.

## 2017-11-16 NOTE — Telephone Encounter (Signed)
Please advise 

## 2017-11-17 NOTE — Telephone Encounter (Signed)
Called to advise.   Patient states he NEVER called to get a RTW no restrictions.  He states he will get MRI and then follow up with Dr Erlinda Hong and then get  New work note.

## 2017-11-17 NOTE — Telephone Encounter (Signed)
yes

## 2017-11-18 NOTE — Telephone Encounter (Signed)
Patient has appointment 11/24/17 with Dr Erlinda Hong.

## 2017-11-22 ENCOUNTER — Ambulatory Visit (INDEPENDENT_AMBULATORY_CARE_PROVIDER_SITE_OTHER): Payer: BLUE CROSS/BLUE SHIELD | Admitting: Orthopaedic Surgery

## 2017-11-24 ENCOUNTER — Telehealth (INDEPENDENT_AMBULATORY_CARE_PROVIDER_SITE_OTHER): Payer: Self-pay | Admitting: Orthopaedic Surgery

## 2017-11-24 ENCOUNTER — Encounter (INDEPENDENT_AMBULATORY_CARE_PROVIDER_SITE_OTHER): Payer: Self-pay | Admitting: Orthopaedic Surgery

## 2017-11-24 ENCOUNTER — Ambulatory Visit (INDEPENDENT_AMBULATORY_CARE_PROVIDER_SITE_OTHER): Payer: BLUE CROSS/BLUE SHIELD | Admitting: Orthopaedic Surgery

## 2017-11-24 DIAGNOSIS — Z9889 Other specified postprocedural states: Secondary | ICD-10-CM

## 2017-11-24 NOTE — Telephone Encounter (Signed)
Patient called asked if Dr Erlinda Hong will prescribe him something for the pain in his right shoulder. Patient advised he was seen in the office today.  Patient said he uses the [facility] in [place].The number to contact patient is [phone] cell phone

## 2017-11-24 NOTE — Progress Notes (Signed)
Post-Op Visit Note   Patient: Blake Solis           Date of Birth: Mar 05, 1956           MRN: [id number] Visit Date: 11/24/2017 PCP: Arsenio Katz, NP   Assessment & Plan:  Chief Complaint:  Chief Complaint  Patient presents with  . Right Shoulder - Pain   Visit Diagnoses:  1. S/P arthroscopy of right shoulder     Plan: Patient follows up today for review of his MRI.  He states overall he is feeling better.  MRI results are consistent with rotator cuff repair and biceps tenodesis without any evidence of reinjury.  These findings were discussed with the patient which are reassuring.  Work restrictions were provided today for 6 weeks.  Begin physical therapy.  Recheck in 6 weeks.  Follow-Up Instructions: Return in about 6 weeks (around 01/05/2018).   Orders:  No orders of the defined types were placed in this encounter.  No orders of the defined types were placed in this encounter.   Imaging: No results found.  PMFS History: Patient Active Problem List   Diagnosis Date Noted  . S/P arthroscopy of right shoulder 11/01/2017  . Superior glenoid labrum lesion of right shoulder   . Arthrosis of right acromioclavicular joint   . Tendinopathy of rotator cuff, right   . Impingement syndrome of right shoulder   . Nontraumatic tear of right supraspinatus tendon 09/29/2017  . Cubital tunnel syndrome on left 05/13/2017  . Avascular necrosis of right femoral head ([place]) 06/07/2012    Class: Diagnosis of  . HTN (hypertension) 07/08/2010  . Hyperlipemia 07/08/2010  . Prostatitis 07/08/2010  . Degenerative disc disease 07/08/2010  . Hydrocele of testis 07/08/2010  . DYSLIPIDEMIA 09/08/2009  . OVERWEIGHT 09/08/2009  . HYPERTENSION 09/08/2009  . DIZZINESS 09/08/2009   Past Medical History:  Diagnosis Date  . Arthritis   . Bronchitis, allergic   . Dizziness   . Dyslipidemia   . GERD (gastroesophageal reflux disease)   . HTN (hypertension)   . Hypertension   .  Overweight(278.02)     History reviewed. No pertinent family history.  Past Surgical History:  Procedure Laterality Date  . CARDIAC CATHETERIZATION    . CYST EXCISION Right    cyst removed from arm  . ELBOW SURGERY Left   . HIP ARTHROPLASTY Right   . KIDNEY DONATION Left   . KNEE ARTHROSCOPY  2013  . lft ulnar nerve removed     decompression  . 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  . SHOULDER ARTHROSCOPY WITH ROTATOR CUFF REPAIR AND SUBACROMIAL DECOMPRESSION Right 10/14/2017   Procedure: RIGHT SHOULDER ARTHROSCOPY WITH EXTENSIVE DEBRIDEMENT, SUBACROMIAL DECOMPRESSION, DISTAL CLAVICLE EXCISION AND BICEPS TENODYSIS;  Surgeon: Leandrew Koyanagi, MD;  Location: [facility];  Service: Orthopedics;  Laterality: Right;  . TOTAL HIP ARTHROPLASTY Right 06/08/2012  . TOTAL HIP ARTHROPLASTY Right 06/07/2012   Procedure: TOTAL HIP ARTHROPLASTY ANTERIOR APPROACH;  Surgeon: Marybelle Killings, MD;  Location: [facility];  Service: Orthopedics;  Laterality: Right;  Right Total Hip Arthroplasty-Anterior Approach   Social History   Occupational History  . Not on file  Tobacco Use  . Smoking status: Former Smoker    Packs/day: 1.00    Years: 40.00    Pack years: 40.00    Types: Cigarettes    Last attempt to quit: 04/22/1998    Years since quitting: 19.6  . Smokeless tobacco: Never Used  Substance and Sexual Activity  . Alcohol use: No  . Drug use: No  . Sexual activity: Not on file

## 2017-11-24 NOTE — Telephone Encounter (Signed)
See message below °

## 2017-11-24 NOTE — Telephone Encounter (Signed)
Tramadol #[phone]

## 2017-11-25 ENCOUNTER — Other Ambulatory Visit (INDEPENDENT_AMBULATORY_CARE_PROVIDER_SITE_OTHER): Payer: Self-pay

## 2017-11-25 MED ORDER — TRAMADOL HCL 50 MG PO TABS: 50.0000 mg | ORAL_TABLET | Freq: Two times a day (BID) | ORAL | 0 refills | Status: DC

## 2017-11-25 NOTE — Telephone Encounter (Signed)
CALLED INTO PHARM, PATIENT AWARE.

## 2017-11-28 ENCOUNTER — Telehealth (INDEPENDENT_AMBULATORY_CARE_PROVIDER_SITE_OTHER): Payer: Self-pay | Admitting: Orthopaedic Surgery

## 2017-11-28 NOTE — Telephone Encounter (Signed)
See other message

## 2017-11-28 NOTE — Telephone Encounter (Signed)
Patient states can't pick up meds because pharmacy needs to talk to MD first.  Please call [facility] in [place] [phone]  Then call patient to advise.

## 2017-11-28 NOTE — Telephone Encounter (Signed)
Patient is aware they will call him once Rx is ready.

## 2017-11-28 NOTE — Telephone Encounter (Signed)
Patient said pharmacy is needing some information before filling prescription, he said something along the lines of needing a diagnosis code? Even then he states they would not be able to fill the full rx. He asks that you call to see what information they are needing.  [facility] in [place] # [phone]  Patients # [phone]

## 2017-11-28 NOTE — Telephone Encounter (Signed)
Called pharm. They needed a Dx Code.

## 2017-12-01 ENCOUNTER — Telehealth (INDEPENDENT_AMBULATORY_CARE_PROVIDER_SITE_OTHER): Payer: Self-pay | Admitting: Orthopaedic Surgery

## 2017-12-01 NOTE — Telephone Encounter (Signed)
Need a corrected referral because XU wrote 12/04/16 on order Please correct and also need most recent visit notes. Please fax to:  [phone]

## 2017-12-02 NOTE — Telephone Encounter (Signed)
FAXED

## 2017-12-08 ENCOUNTER — Encounter (INDEPENDENT_AMBULATORY_CARE_PROVIDER_SITE_OTHER): Payer: Self-pay | Admitting: Orthopaedic Surgery

## 2017-12-08 ENCOUNTER — Ambulatory Visit (INDEPENDENT_AMBULATORY_CARE_PROVIDER_SITE_OTHER): Payer: BLUE CROSS/BLUE SHIELD | Admitting: Physician Assistant

## 2017-12-08 DIAGNOSIS — Z9889 Other specified postprocedural states: Secondary | ICD-10-CM

## 2017-12-08 NOTE — Progress Notes (Signed)
Patient: Blake Solis           Date of Birth: Dec 14, 1955           MRN: [id number] Visit Date: 12/08/2017 PCP: Arsenio Katz, NP   Assessment & Plan:  Chief Complaint:  Chief Complaint  Patient presents with  . Right Shoulder - Pain, Follow-up, Routine Post Op   Visit Diagnoses:  1. S/P arthroscopy of right shoulder     Plan: Patient is a pleasant 62 year old gentleman who presents to our clinic today 55 days status post right shoulder arthroscopic decompression and biceps tenodesis, date of surgery 10/14/2017.  He was initially doing well until he returned to work and lifted a heavy box.  Felt a pop and was seen in our office.  Repeat MRI was negative.  He has been out of work at this point as they will not allow him to work with restrictions.  His shoulder is doing very well overall.  Minimal to no pain.  He has been doing physical therapy where he is regained full motion and near full strength.  He would like to return to work as soon as possible.  He does state that he does not have to lift any more than 15 pounds.  Examination of his right shoulder reveals full active range of motion all planes.  Full strength of the shoulder and elbow.  At this point, we will release him to work without restrictions as long as he abides by no lifting more than 15 pounds.  He will follow-up with [place] in 4 weeks time for recheck.  Call if concerns or questions.  Follow-Up Instructions: Return in about 4 weeks (around 01/05/2018).   Orders:  No orders of the defined types were placed in this encounter.  No orders of the defined types were placed in this encounter.   Imaging: No new imaging   PMFS History: Patient Active Problem List   Diagnosis Date Noted  . S/P arthroscopy of right shoulder 11/01/2017  . Superior glenoid labrum lesion of right shoulder   . Arthrosis of right acromioclavicular joint   . Tendinopathy of rotator cuff, right   . Impingement syndrome of right shoulder   .  Nontraumatic tear of right supraspinatus tendon 09/29/2017  . Cubital tunnel syndrome on left 05/13/2017  . Avascular necrosis of right femoral head ([place]) 06/07/2012    Class: Diagnosis of  . HTN (hypertension) 07/08/2010  . Hyperlipemia 07/08/2010  . Prostatitis 07/08/2010  . Degenerative disc disease 07/08/2010  . Hydrocele of testis 07/08/2010  . DYSLIPIDEMIA 09/08/2009  . OVERWEIGHT 09/08/2009  . HYPERTENSION 09/08/2009  . DIZZINESS 09/08/2009   Past Medical History:  Diagnosis Date  . Arthritis   . Bronchitis, allergic   . Dizziness   . Dyslipidemia   . GERD (gastroesophageal reflux disease)   . HTN (hypertension)   . Hypertension   . Overweight(278.02)     History reviewed. No pertinent family history.  Past Surgical History:  Procedure Laterality Date  . CARDIAC CATHETERIZATION    . CYST EXCISION Right    cyst removed from arm  . ELBOW SURGERY Left   . HIP ARTHROPLASTY Right   . KIDNEY DONATION Left   . KNEE ARTHROSCOPY  2013  . lft ulnar nerve removed     decompression  . 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  . SHOULDER ARTHROSCOPY WITH ROTATOR CUFF REPAIR AND SUBACROMIAL DECOMPRESSION Right 10/14/2017   Procedure:  RIGHT SHOULDER ARTHROSCOPY WITH EXTENSIVE DEBRIDEMENT, SUBACROMIAL DECOMPRESSION, DISTAL CLAVICLE EXCISION AND BICEPS TENODYSIS;  Surgeon: Leandrew Koyanagi, MD;  Location: [facility];  Service: Orthopedics;  Laterality: Right;  . TOTAL HIP ARTHROPLASTY Right 06/08/2012  . TOTAL HIP ARTHROPLASTY Right 06/07/2012   Procedure: TOTAL HIP ARTHROPLASTY ANTERIOR APPROACH;  Surgeon: Marybelle Killings, MD;  Location: [facility];  Service: Orthopedics;  Laterality: Right;  Right Total Hip Arthroplasty-Anterior Approach   Social History   Occupational History  . Not on file  Tobacco Use  . Smoking status: Former Smoker    Packs/day: 1.00    Years: 40.00    Pack years: 40.00    Types: Cigarettes    Last attempt to quit: 04/22/1998     Years since quitting: 19.6  . Smokeless tobacco: Never Used  Substance and Sexual Activity  . Alcohol use: No  . Drug use: No  . Sexual activity: Not on file

## 2018-01-05 ENCOUNTER — Encounter (INDEPENDENT_AMBULATORY_CARE_PROVIDER_SITE_OTHER): Payer: Self-pay | Admitting: Orthopaedic Surgery

## 2018-01-05 ENCOUNTER — Ambulatory Visit (INDEPENDENT_AMBULATORY_CARE_PROVIDER_SITE_OTHER): Payer: BLUE CROSS/BLUE SHIELD | Admitting: Physician Assistant

## 2018-01-05 DIAGNOSIS — Z9889 Other specified postprocedural states: Secondary | ICD-10-CM

## 2018-01-05 NOTE — Progress Notes (Signed)
Post-Op Visit Note   Patient: Blake Solis           Date of Birth: 10-29-1955           MRN: [id number] Visit Date: 01/05/2018 PCP: Arsenio Katz, NP   Assessment & Plan:  Chief Complaint:  Chief Complaint  Patient presents with  . Right Shoulder - Routine Post Op, Follow-up   Visit Diagnoses:  1. S/P arthroscopy of right shoulder     Plan: Patient is a pleasant 62 year old gentleman who presents to our clinic today 83 days status post right shoulder arthroscopic debridement, subacromial decompression, distal clavicle excision and arthroscopic biceps tenodesis, date of surgery 10/14/2017.  He has been doing well over the past few weeks.  He has been in formal physical therapy making great progress with range of motion and strength.  He does still lack internal rotation motion as well as strength with resisted external rotation.  Examination of the right shoulder reveals full forward flexion and abduction.  He can internally rotate to L5.  4 out of 5 strength throughout.  He is neurovascularly intact distally.  At this point, would like for him to continue with therapy for another couple weeks.  He will continue working on strengthening exercises at home.  He is able to work without lifting more than 15 pounds so he will continue working.  He will follow-up with [place] in 4 weeks time for hopefully a final check.  Call with concerns or questions in the meantime.  Follow-Up Instructions: Return in about 4 weeks (around 02/02/2018).   Orders:  No orders of the defined types were placed in this encounter.  No orders of the defined types were placed in this encounter.   Imaging: No new imaging  PMFS History: Patient Active Problem List   Diagnosis Date Noted  . S/P arthroscopy of right shoulder 11/01/2017  . Superior glenoid labrum lesion of right shoulder   . Arthrosis of right acromioclavicular joint   . Tendinopathy of rotator cuff, right   . Impingement syndrome of right  shoulder   . Nontraumatic tear of right supraspinatus tendon 09/29/2017  . Cubital tunnel syndrome on left 05/13/2017  . Avascular necrosis of right femoral head ([place]) 06/07/2012    Class: Diagnosis of  . HTN (hypertension) 07/08/2010  . Hyperlipemia 07/08/2010  . Prostatitis 07/08/2010  . Degenerative disc disease 07/08/2010  . Hydrocele of testis 07/08/2010  . DYSLIPIDEMIA 09/08/2009  . OVERWEIGHT 09/08/2009  . HYPERTENSION 09/08/2009  . DIZZINESS 09/08/2009   Past Medical History:  Diagnosis Date  . Arthritis   . Bronchitis, allergic   . Dizziness   . Dyslipidemia   . GERD (gastroesophageal reflux disease)   . HTN (hypertension)   . Hypertension   . Overweight(278.02)     History reviewed. No pertinent family history.  Past Surgical History:  Procedure Laterality Date  . CARDIAC CATHETERIZATION    . CYST EXCISION Right    cyst removed from arm  . ELBOW SURGERY Left   . HIP ARTHROPLASTY Right   . KIDNEY DONATION Left   . KNEE ARTHROSCOPY  2013  . lft ulnar nerve removed     decompression  . 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  . SHOULDER ARTHROSCOPY WITH ROTATOR CUFF REPAIR AND SUBACROMIAL DECOMPRESSION Right 10/14/2017   Procedure: RIGHT SHOULDER ARTHROSCOPY WITH EXTENSIVE DEBRIDEMENT, SUBACROMIAL DECOMPRESSION, DISTAL CLAVICLE EXCISION AND BICEPS TENODYSIS;  Surgeon: Leandrew Koyanagi, MD;  Location: [facility];  Service: Orthopedics;  Laterality: Right;  . TOTAL HIP ARTHROPLASTY Right 06/08/2012  . TOTAL HIP ARTHROPLASTY Right 06/07/2012   Procedure: TOTAL HIP ARTHROPLASTY ANTERIOR APPROACH;  Surgeon: Marybelle Killings, MD;  Location: [facility];  Service: Orthopedics;  Laterality: Right;  Right Total Hip Arthroplasty-Anterior Approach   Social History   Occupational History  . Not on file  Tobacco Use  . Smoking status: Former Smoker    Packs/day: 1.00    Years: 40.00    Pack years: 40.00    Types: Cigarettes    Last attempt to quit:  04/22/1998    Years since quitting: 19.7  . Smokeless tobacco: Never Used  Substance and Sexual Activity  . Alcohol use: No  . Drug use: No  . Sexual activity: Not on file

## 2018-02-14 ENCOUNTER — Ambulatory Visit (INDEPENDENT_AMBULATORY_CARE_PROVIDER_SITE_OTHER): Payer: BLUE CROSS/BLUE SHIELD | Admitting: Orthopaedic Surgery

## 2019-01-05 ENCOUNTER — Other Ambulatory Visit: Payer: Self-pay

## 2019-01-05 DIAGNOSIS — Z20822 Contact with and (suspected) exposure to covid-19: Secondary | ICD-10-CM

## 2019-01-06 LAB — NOVEL CORONAVIRUS, NAA: SARS-CoV-2, NAA: DETECTED — AB

## 2019-01-29 ENCOUNTER — Encounter (INDEPENDENT_AMBULATORY_CARE_PROVIDER_SITE_OTHER): Payer: Self-pay | Admitting: *Deleted

## 2019-02-14 ENCOUNTER — Ambulatory Visit: Payer: Self-pay

## 2019-02-14 ENCOUNTER — Ambulatory Visit (INDEPENDENT_AMBULATORY_CARE_PROVIDER_SITE_OTHER): Payer: BC Managed Care – PPO | Admitting: Orthopaedic Surgery

## 2019-02-14 ENCOUNTER — Encounter: Payer: Self-pay | Admitting: Orthopaedic Surgery

## 2019-02-14 ENCOUNTER — Other Ambulatory Visit: Payer: Self-pay

## 2019-02-14 VITALS — Ht 70.5 in | Wt 247.0 lb

## 2019-02-14 DIAGNOSIS — M79605 Pain in left leg: Secondary | ICD-10-CM

## 2019-02-14 DIAGNOSIS — M25522 Pain in left elbow: Secondary | ICD-10-CM

## 2019-02-14 MED ORDER — GABAPENTIN 100 MG PO CAPS: 100.0000 mg | ORAL_CAPSULE | Freq: Three times a day (TID) | ORAL | 3 refills | Status: DC | PRN

## 2019-02-14 MED ORDER — PREDNISONE 10 MG (21) PO TBPK: ORAL_TABLET | ORAL | 0 refills | Status: DC

## 2019-02-14 NOTE — Progress Notes (Signed)
Office Visit Note   Patient: Blake Solis           Date of Birth: 1955-10-10           MRN: [id number] Visit Date: 02/14/2019              Requested by: Arsenio Katz, NP [place],  [place] [address] PCP: Arsenio Katz, NP   Assessment & Plan: Visit Diagnoses:  1. Pain in left leg   2. Pain in left elbow     Plan: Impression is left ulnar neuritis and left leg sciatica.  Prescription for prednisone Dosepak and gabapentin to hopefully calm this down.  Patient instructed to follow-up if he does not notice any improvement.  Follow-up as needed.  Follow-Up Instructions: Return if symptoms worsen or fail to improve.   Orders:  Orders Placed This Encounter  Procedures  . XR Lumbar Spine 2-3 Views  . XR Elbow 2 Views Left   Meds ordered this encounter  Medications  . gabapentin (NEURONTIN) 100 MG capsule    Sig: Take 1-3 capsules (100-300 mg total) by mouth 3 (three) times daily as needed.    Dispense:  30 capsule    Refill:  3  . predniSONE (STERAPRED UNI-PAK 21 TAB) 10 MG (21) TBPK tablet    Sig: Take as directed    Dispense:  21 tablet    Refill:  0      Procedures: No procedures performed   Clinical Data: No additional findings.   Subjective: Chief Complaint  Patient presents with  . Left Elbow - Pain  . Left Leg - Pain    Blake Solis is a 63 year old gentleman who is well-known to me who comes in for evaluation of 2 separate problems.  The first 1 is his left elbow pain for about 4 weeks.  He is status post left cubital tunnel release and anterior transposition.  He denies any recent injuries.  He feels like his ulnar digits are numb again.  He denies any focal weakness.  He is left-hand dominant.  In terms of his left leg he states that he has posterior thigh pain that radiates down the back of his leg all the way down to his foot.  Denies any significant back pain.  This is actually bothering him more than his left elbow.   Review of Systems   Constitutional: Negative.   All other systems reviewed and are negative.    Objective: Vital Signs: Ht 5' 10.5" (1.791 m)   Wt 247 lb (112 kg)   BMI 34.94 kg/m   Physical Exam Vitals signs and nursing note reviewed.  Constitutional:      Appearance: He is well-developed.  Pulmonary:     Effort: Pulmonary effort is normal.  Abdominal:     Palpations: Abdomen is soft.  Skin:    General: Skin is warm.  Neurological:     Mental Status: He is alert and oriented to person, place, and time.  Psychiatric:        Behavior: Behavior normal.        Thought Content: Thought content normal.        Judgment: Judgment normal.     Ortho Exam Left elbow exam shows fully healed surgical scars.  Range of motion of the elbow does not cause any pain.  Positive Tinel's.  Ulnar nerve is stable.  No motor deficits distally.  He does have decreased sensation ulnar nerve distribution his hand.  Left lower extremity exam  shows positive sciatic tension sign.  No focal motor or sensory deficits.  No pathologic reflexes. Specialty Comments:  No specialty comments available.  Imaging: Xr Elbow 2 Views Left  Result Date: 02/14/2019 Degenerative changes of the elbow joint.  There is degenerative spurring of the olecranon.  Xr Lumbar Spine 2-3 Views  Result Date: 02/14/2019 Preservation of lumbar lordosis.  Moderate degenerative disc disease with bridging osteophytes.  Lumbar spondylosis.    PMFS History: Patient Active Problem List   Diagnosis Date Noted  . S/P arthroscopy of right shoulder 11/01/2017  . Superior glenoid labrum lesion of right shoulder   . Arthrosis of right acromioclavicular joint   . Tendinopathy of rotator cuff, right   . Impingement syndrome of right shoulder   . Nontraumatic tear of right supraspinatus tendon 09/29/2017  . Cubital tunnel syndrome on left 05/13/2017  . Avascular necrosis of right femoral head ([place]) 06/07/2012    Class: Diagnosis of  . HTN  (hypertension) 07/08/2010  . Hyperlipemia 07/08/2010  . Prostatitis 07/08/2010  . Degenerative disc disease 07/08/2010  . Hydrocele of testis 07/08/2010  . DYSLIPIDEMIA 09/08/2009  . OVERWEIGHT 09/08/2009  . HYPERTENSION 09/08/2009  . DIZZINESS 09/08/2009   Past Medical History:  Diagnosis Date  . Arthritis   . Bronchitis, allergic   . Dizziness   . Dyslipidemia   . GERD (gastroesophageal reflux disease)   . HTN (hypertension)   . Hypertension   . Overweight(278.02)     History reviewed. No pertinent family history.  Past Surgical History:  Procedure Laterality Date  . CARDIAC CATHETERIZATION    . CYST EXCISION Right    cyst removed from arm  . ELBOW SURGERY Left   . HIP ARTHROPLASTY Right   . KIDNEY DONATION Left   . KNEE ARTHROSCOPY  2013  . lft ulnar nerve removed     decompression  . 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  . SHOULDER ARTHROSCOPY WITH ROTATOR CUFF REPAIR AND SUBACROMIAL DECOMPRESSION Right 10/14/2017   Procedure: RIGHT SHOULDER ARTHROSCOPY WITH EXTENSIVE DEBRIDEMENT, SUBACROMIAL DECOMPRESSION, DISTAL CLAVICLE EXCISION AND BICEPS TENODYSIS;  Surgeon: Leandrew Koyanagi, MD;  Location: [facility];  Service: Orthopedics;  Laterality: Right;  . TOTAL HIP ARTHROPLASTY Right 06/08/2012  . TOTAL HIP ARTHROPLASTY Right 06/07/2012   Procedure: TOTAL HIP ARTHROPLASTY ANTERIOR APPROACH;  Surgeon: Marybelle Killings, MD;  Location: [facility];  Service: Orthopedics;  Laterality: Right;  Right Total Hip Arthroplasty-Anterior Approach   Social History   Occupational History  . Not on file  Tobacco Use  . Smoking status: Former Smoker    Packs/day: 1.00    Years: 40.00    Pack years: 40.00    Types: Cigarettes    Quit date: 04/22/1998    Years since quitting: 20.8  . Smokeless tobacco: Never Used  Substance and Sexual Activity  . Alcohol use: No  . Drug use: No  . Sexual activity: Not on file

## 2019-05-07 IMAGING — MR MR SHOULDER*R* W/O CM
5 series · 36 of 40 positions shown · non-contrast
Comparison: None.

CLINICAL DATA: Two months of right shoulder pain with weakness and
numbness in both arms.

EXAM:
MRI OF THE RIGHT SHOULDER WITHOUT CONTRAST
TECHNIQUE: Multiplanar, multisequence MR imaging of the shoulder was performed.
No intravenous contrast was administered.

[Series 3: PD fat-sat · axial · 4.0mm · 0.55mm/px · z∈[-23,+64]mm · 8 of 20 slices shown (1 of 2)]
[im 1/20]
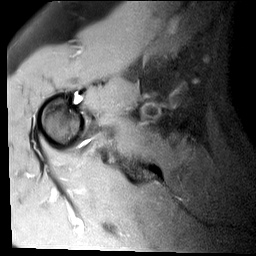
[im 3/20]
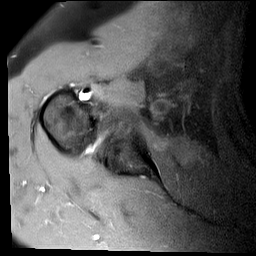
[im 6/20]
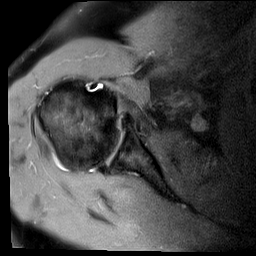
[im 9/20]
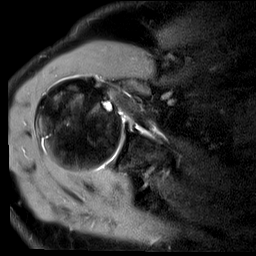
[im 11/20]
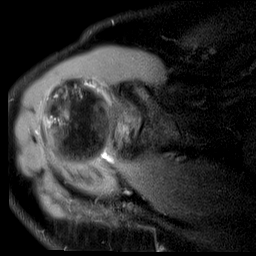
[im 14/20]
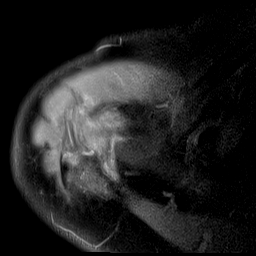
[im 17/20]
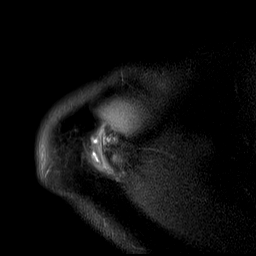
[im 20/20]
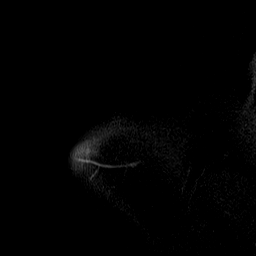

[Series 4: T1 · oblique · 4.0mm · 0.27mm/px · 4 of 20 slices shown]
[im 1/20]
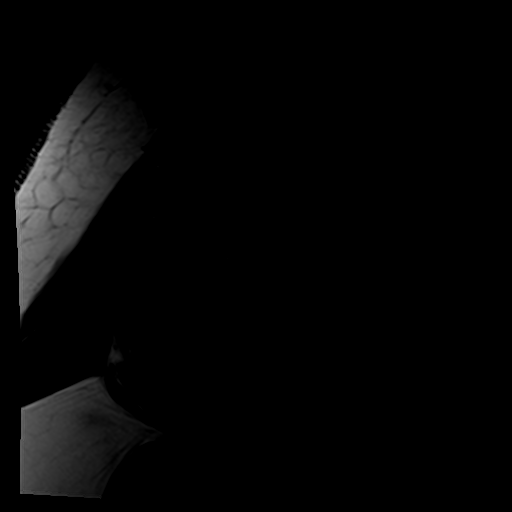
[im 3/20]
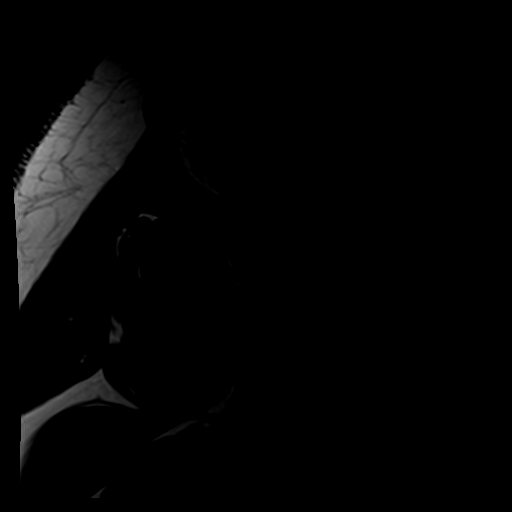
[im 6/20]
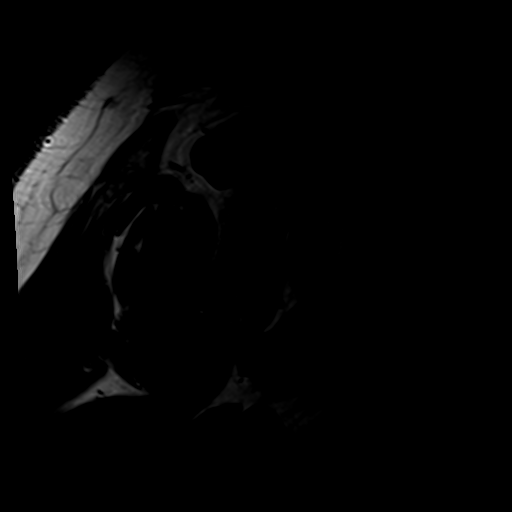
[im 9/20]
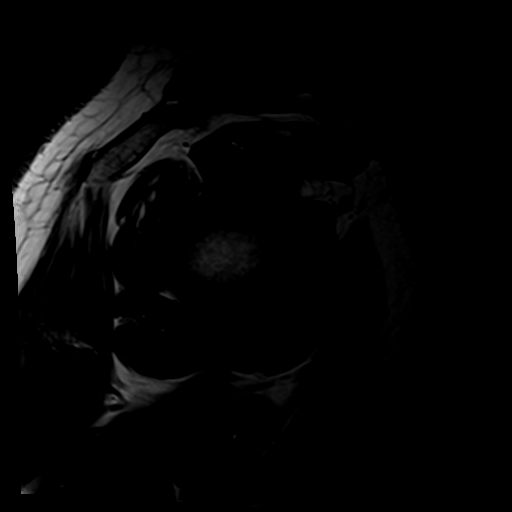

[Series 5: T2 fat-sat · oblique · 4.0mm · 0.55mm/px · 9 of 20 slices shown (1 of 2)]
[im 1/20]
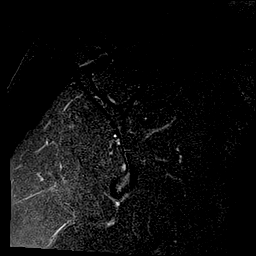
[im 3/20]
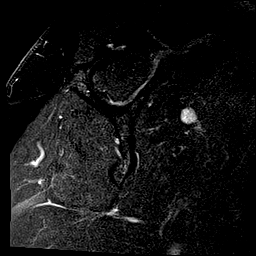
[im 5/20]
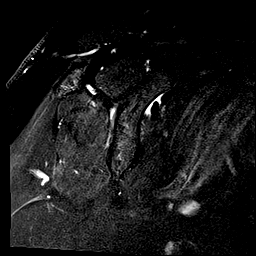
[im 8/20]
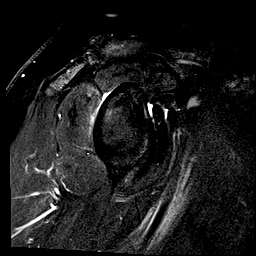
[im 10/20]
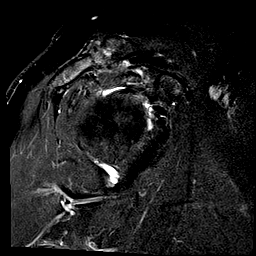
[im 12/20]
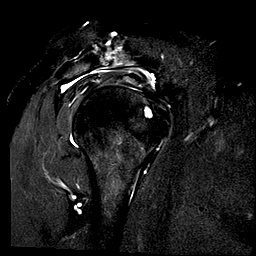
[im 15/20]
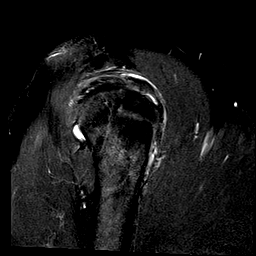
[im 17/20]
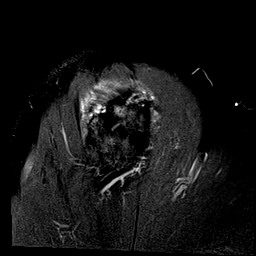
[im 20/20]
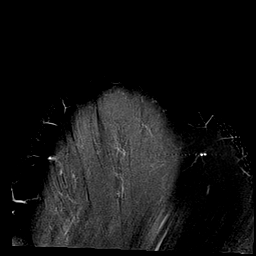

[Series 6: T2 fat-sat · oblique · 4.0mm · 0.55mm/px · 7 of 16 slices shown (2 of 2)]
[im 1/16]
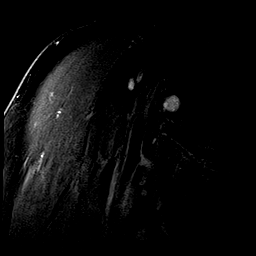
[im 3/16]
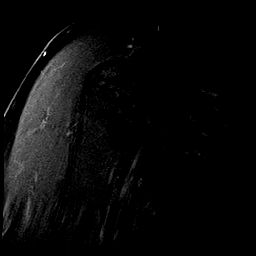
[im 6/16]
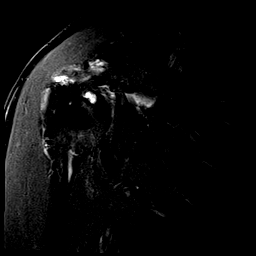
[im 8/16]
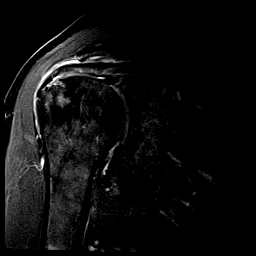
[im 11/16]
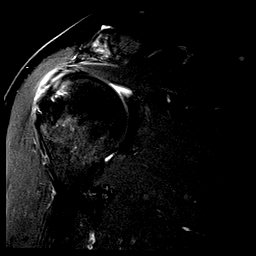
[im 13/16]
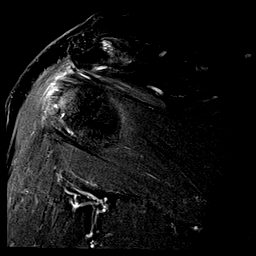
[im 16/16]
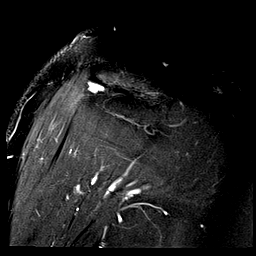

[Series 7: PD fat-sat · oblique · 4.0mm · 0.27mm/px · 8 of 18 slices shown (2 of 2)]
[im 1/18]
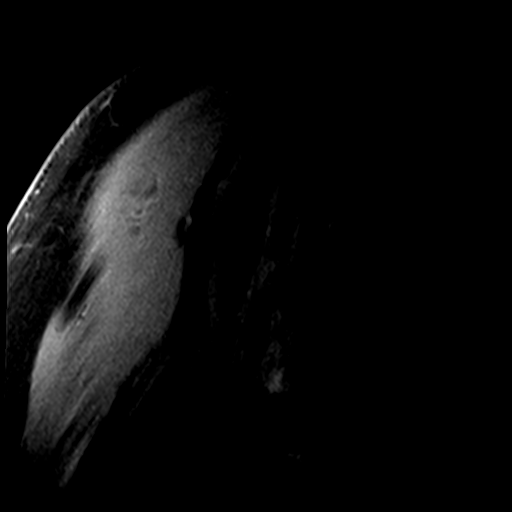
[im 3/18]
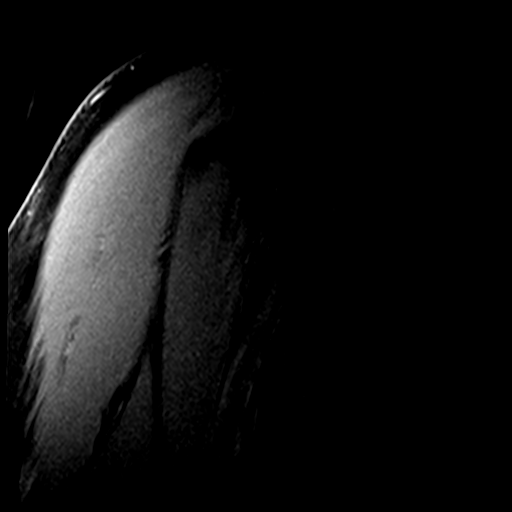
[im 5/18]
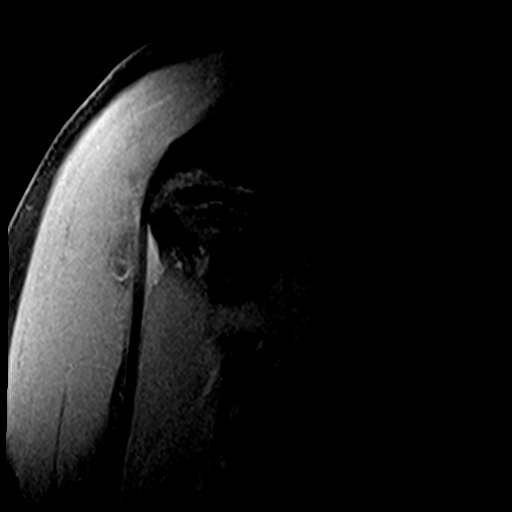
[im 8/18]
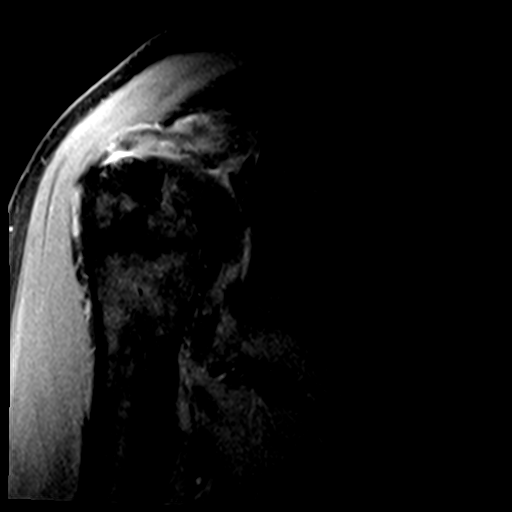
[im 10/18]
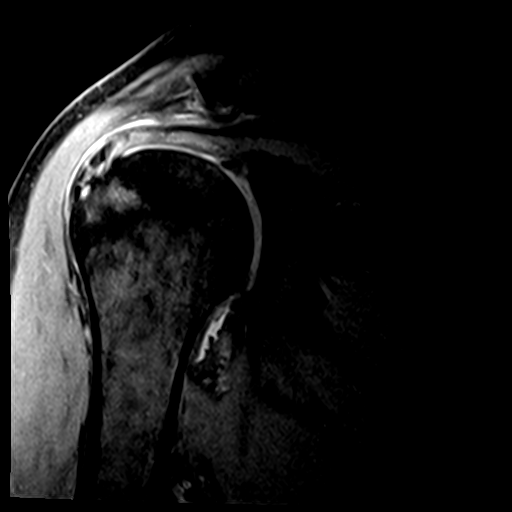
[im 13/18]
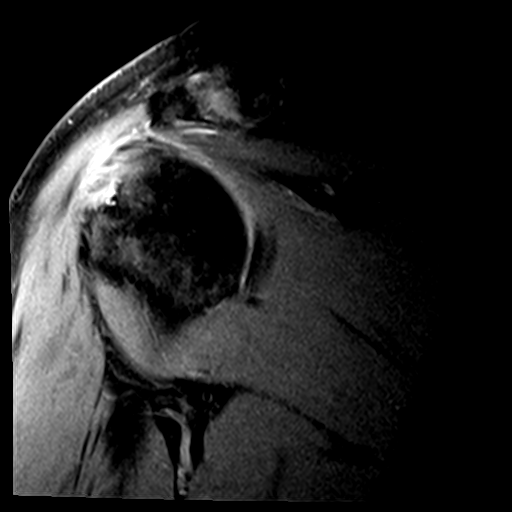
[im 15/18]
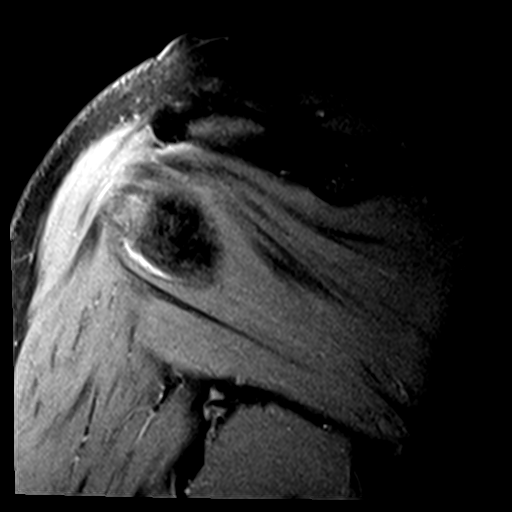
[im 18/18]
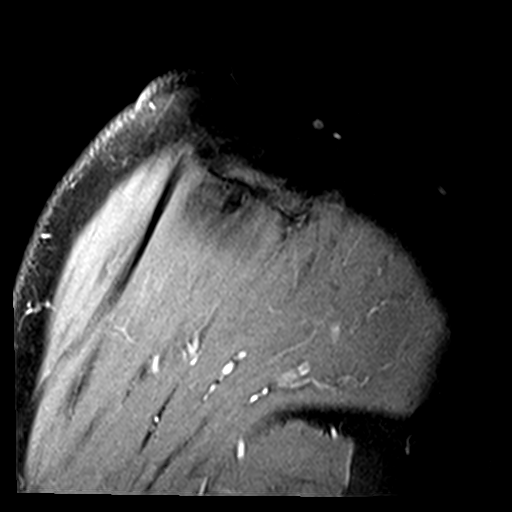

[36 of 40 positions shown; findings below may reference images not displayed]

FINDINGS: Rotator cuff: Partial articular surface tear involving the anterior
fibers of the supraspinatus, series [DATE] and series [DATE]. Associated
tendinosis of the myotendinous junction of the supraspinatus. The
infraspinatus, subscapularis and teres minor appear intact.

Muscles: Mild atrophy of the supraspinatus.

Biceps long head:  Intact

Acromioclavicular Joint: Mild-to-moderate arthropathy of the
acromioclavicular joint with joint space narrowing and spurring.
Type I flat shaped acromion. Small amount of subacromial and
subdeltoid bursal fluid consistent bursitis.

Glenohumeral Joint: No joint effusion. No chondral defect.

Labrum:  Intact

Bones: Subchondral cystic change of the anteromedial and
superolateral humeral head.

Other: None
IMPRESSION: 1. Partial articular surface tear of the anterior fibers of the
supraspinatus.
2. Moderate AC joint osteoarthritis with associated subacromial and
subdeltoid bursal edema.
3. No labral tear is identified.

## 2019-05-07 IMAGING — CR DG ORBITS FOR FOREIGN BODY
2 series · 2 of 2 positions shown · non-contrast
Comparison: None.

CLINICAL DATA: Metal working/exposure; clearance prior to MRI.
62-year-old male.

EXAM:
ORBITS FOR FOREIGN BODY - 2 VIEW

[w orbit pa (1 of 2)]
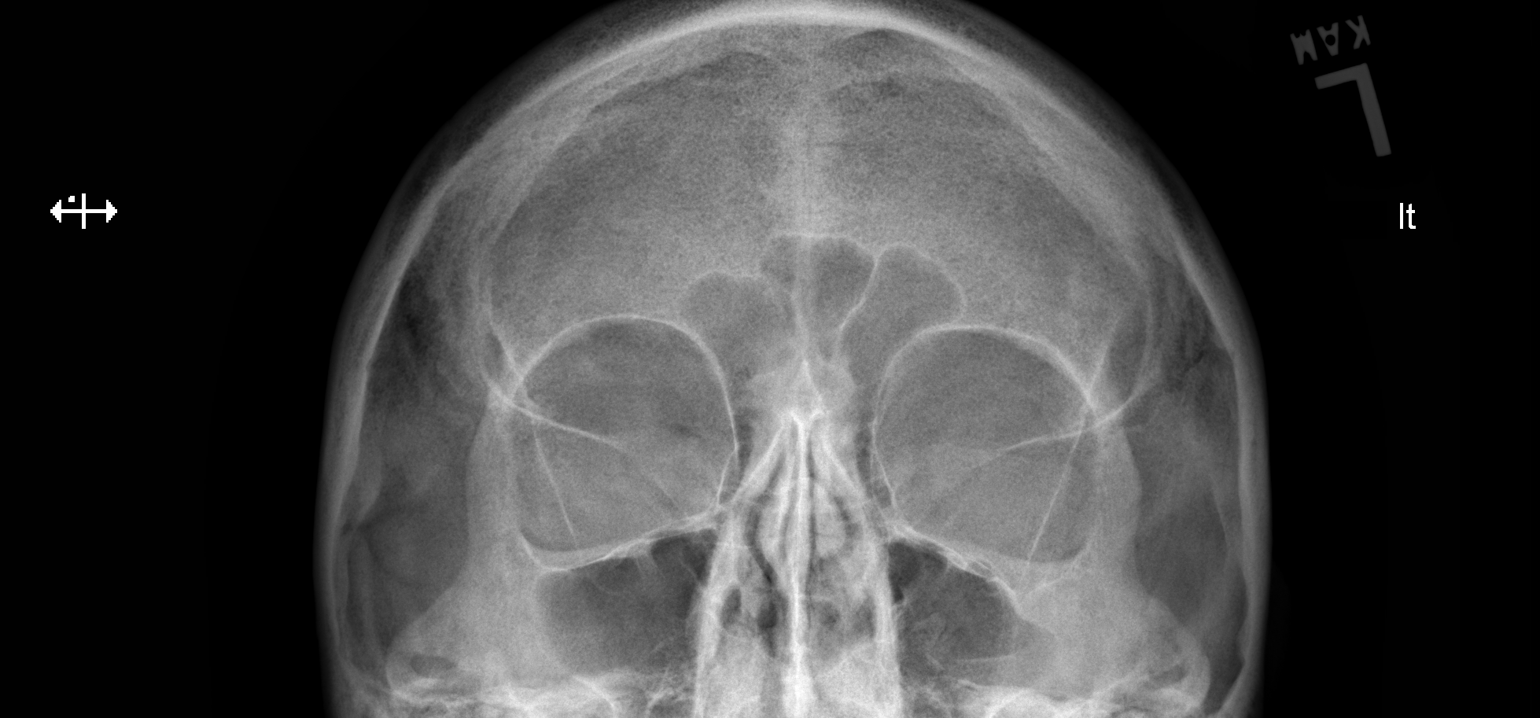

[w orbit pa (2 of 2)]
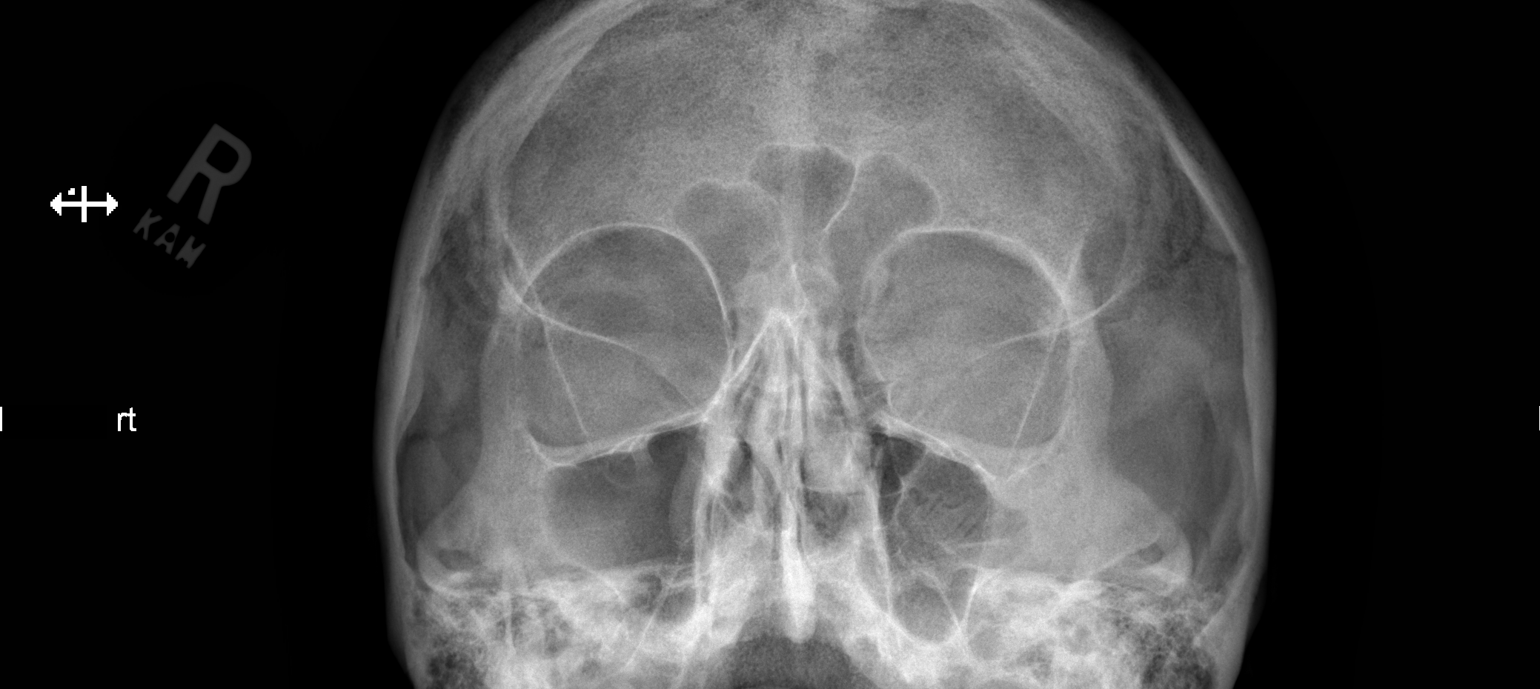

[2 of 2 positions shown; findings below may reference images not displayed]

FINDINGS: There is no evidence of metallic foreign body within the orbits. No
significant bone abnormality identified.
IMPRESSION: No evidence of metallic foreign body within the orbits.

## 2019-06-21 ENCOUNTER — Encounter (INDEPENDENT_AMBULATORY_CARE_PROVIDER_SITE_OTHER): Payer: Self-pay | Admitting: *Deleted

## 2019-06-21 ENCOUNTER — Other Ambulatory Visit (INDEPENDENT_AMBULATORY_CARE_PROVIDER_SITE_OTHER): Payer: Self-pay | Admitting: *Deleted

## 2019-06-25 ENCOUNTER — Other Ambulatory Visit (INDEPENDENT_AMBULATORY_CARE_PROVIDER_SITE_OTHER): Payer: Self-pay | Admitting: *Deleted

## 2019-06-25 ENCOUNTER — Encounter (INDEPENDENT_AMBULATORY_CARE_PROVIDER_SITE_OTHER): Payer: Self-pay | Admitting: *Deleted

## 2019-06-25 DIAGNOSIS — Z1211 Encounter for screening for malignant neoplasm of colon: Secondary | ICD-10-CM

## 2019-07-04 ENCOUNTER — Encounter (INDEPENDENT_AMBULATORY_CARE_PROVIDER_SITE_OTHER): Payer: Self-pay | Admitting: *Deleted

## 2019-07-19 ENCOUNTER — Other Ambulatory Visit: Payer: Self-pay

## 2019-07-19 ENCOUNTER — Telehealth (INDEPENDENT_AMBULATORY_CARE_PROVIDER_SITE_OTHER): Payer: Self-pay | Admitting: *Deleted

## 2019-07-19 ENCOUNTER — Ambulatory Visit (INDEPENDENT_AMBULATORY_CARE_PROVIDER_SITE_OTHER): Payer: Self-pay

## 2019-07-19 NOTE — Telephone Encounter (Signed)
Referring MD/PCP: browning   Procedure: tcs  Reason/Indication:  screening  Has patient had this procedure before?  Yes, 10-15 yrs ago  If so, when, by whom and where?    Is there a family history of colon cancer?  Yes, brother  Who?  What age when diagnosed?    Is patient diabetic?   no      Does patient have prosthetic heart valve or mechanical valve?  no  Do you have a pacemaker/defibrillator?  no  Has patient ever had endocarditis/atrial fibrillation? no  Does patient use oxygen? no  Has patient had joint replacement within last 12 months?  no  Is patient constipated or do they take laxatives? no  Does patient have a history of alcohol/drug use?  no  Is patient on blood thinner such as Coumadin, Plavix and/or Aspirin? no  Medications: losartan/hctz 100/12.5 mg daily, stiolto bid, metoprolol 50 mg daily  Allergies: nkda  Medication Adjustment per Dr Laural Golden:   Procedure date & time: 5/26

## 2019-07-20 ENCOUNTER — Telehealth (INDEPENDENT_AMBULATORY_CARE_PROVIDER_SITE_OTHER): Payer: Self-pay | Admitting: *Deleted

## 2019-07-20 MED ORDER — SUTAB 1479-225-188 MG PO TABS: 1.0000 | ORAL_TABLET | Freq: Once | ORAL | 0 refills | Status: AC

## 2019-07-20 NOTE — Telephone Encounter (Signed)
Ok to schedule.

## 2019-07-20 NOTE — Telephone Encounter (Signed)
Patient needs [employer] (copay card) ° °

## 2019-08-13 ENCOUNTER — Other Ambulatory Visit (HOSPITAL_COMMUNITY): Payer: BC Managed Care – PPO

## 2019-08-15 ENCOUNTER — Ambulatory Visit (HOSPITAL_COMMUNITY): Admit: 2019-08-15 | Payer: BC Managed Care – PPO | Admitting: Internal Medicine

## 2019-08-15 ENCOUNTER — Encounter (HOSPITAL_COMMUNITY): Payer: Self-pay

## 2019-08-15 SURGERY — COLONOSCOPY
Anesthesia: Moderate Sedation

## 2019-08-22 ENCOUNTER — Other Ambulatory Visit (INDEPENDENT_AMBULATORY_CARE_PROVIDER_SITE_OTHER): Payer: Self-pay | Admitting: *Deleted

## 2019-08-22 ENCOUNTER — Encounter (INDEPENDENT_AMBULATORY_CARE_PROVIDER_SITE_OTHER): Payer: Self-pay | Admitting: *Deleted

## 2019-08-22 DIAGNOSIS — Z1211 Encounter for screening for malignant neoplasm of colon: Secondary | ICD-10-CM

## 2019-09-03 ENCOUNTER — Encounter (INDEPENDENT_AMBULATORY_CARE_PROVIDER_SITE_OTHER): Payer: Self-pay | Admitting: *Deleted

## 2019-09-03 NOTE — Progress Notes (Deleted)
Blake Solis [place] [address]  PLEASE READ ALL INSTRUCTIONS CAREFULLY BEFORE YOUR COLONOSCOPY  Sample of Plenvu given from office   [place] is requiring all patients who are scheduled for outpatient procedures be tested for COVID-19 2 days prior to procedure.  This will be done at [facility] on ***.  Once you are tested you will need to be in quarantine at home until you have your procedure.   Procedure date and time to be at [facility]:  09/06/2019 at 12:30 PM  ([address], [place] [address])  If you have any questions, you may call Ann at [phone].  You must have a responsible adult available to drive you home and be with you after the procedure.  If you do not have an available adult the hospital will not do the procedure.  Please let us know if you are taking any blood thinners, iron or diabetic medicine if it's not listed below to hold.  You can take your medication as normal unless something is listed below to hold or adjust.     On 09/04/2019 you can have regular food and drink until 2 pm and after 2:00 pm start clear liquids and do clear liquids the rest of the day, NO SOLID FOOD AFTER 2:00.  On 09/05/2019 you will continue clear liquids all day today, NO SOLID FOODS.  At 6:00 pm: Drink dose 1 of Plenvu (mango flavour): Mix the dose 1 packet with water to the fill line and stir until dissolved. Drink over the next 30 minutes and follow with 2 large glasses of a clear liquid over the next 30 minutes.  At 9:00 pm: drink dose 2 of Plenvu (fruit punch flavour): Mix the dose 2 packets (A & B) with water to fill line and stir until dissolved. Drink over the next 30 minutes and follow with 2 large glasses of a clear liquid over the next 30 minutes.  Once you finish the solution you can go back on clear liquids and you can continue clear liquids up until 4 hours to your arrival time at the hospital but nothing to eat or drink during the 4  hour time period before your procedure.  THIS IS A LIST OF CLEAR LIQUIDS THAT YOU CAN HAVE DURING YOUR CLEAR LIQUID TIME: Jell-O (NOT RED), popsicles (NOT RED), apple juice or white grape juice, Kool-Aid (NOT RED), soft drinks (any flavor except cherry), water, coffee or tea (NO CREAM or MILK), broth (beef/chicken/vegetable)  THIS LIST IS WHAT YOU CANNOT HAVE: beer, wine, liquor or any alcoholic beverage, dairy products, Swanson's broth, cranberry juice, tomato juice, V8 juice, grapefruit juice, orange juice, red grape juice, or any solid foods such as cereal, oatmeal, yogurt, fruits, vegetables, creamed soup, eggs, bread, etc.       Educate yourself to understand colonoscopies and your insurance  Types of colonoscopy (preventative, surveillance, or diagnostic)  Diagnostic/Therapeutic colonoscopy (CPT [phone] Colonoscopy, flexible, proximal to splenic flexure; diagnostic, with or without collection of specimen(s) by brushing or washing, with or without colon decompression (separate procedure)   Patient has a gastrointestinal sign, symptom(s), and/or diagnosis.(rectal bleeding, change in stool, etc.)  Preventive colonoscopy screening (CPT [phone], [phone] Colorectal cancer screening; colonoscopy on individual not meeting criteria for high risk)   Patient is 64 years of age or older  Patient does not have any gastrointestinal sign, symptom(s), and/or relevant diagnosis  Patient does not have any personal history of colon cancer, polyps,  and/or gastrointestinal disease  Patient may have a family history of gastrointestinal sign, symptom(s), and/or relevant diagnosis  Surveillance colonoscopy (CPT [phone], [phone])   Patient does not have any gastrointestinal sign, symptom(s), and/or relevant diagnosis.  Patient has a personal history of colon cancer, polyps, and/or gastrointestinal disease.  IMPORTANT When the screening test results in the diagnosis of clinically significant colorectal  adenomas or cancer, the patient will be followed by a surveillance regimen and recommendations for screening are no longer applicable.  The only exception is with Medicare.    Your insurance carrier may not pay in full and may apply your colonoscopy to your deductible if polyps are found during your screening exam.    A surveillance exam is not a screening exam.    Please ask your insurance carrier what your benefits are for screening, diagnositc and surveillance.  Your preventative wellness may not pay for your high risk colonoscopy at 100%. Our office DOES NOT check benefits.              [place] [address]  PLEASE READ ALL INSTRUCTIONS CAREFULLY BEFORE YOUR COLONOSCOPY  The prescription for your prep kit will be sent electronically to *** pharmacy in ***.  If the pharmacy does not get the prescription please call and let me know.  PICK UP YOUR PRESCRIPTION WITHIN 1 WEEK OF RECEIVING THESE INSTRUCTIONS.  [place] is requiring all patients who are scheduled for outpatient procedures be tested for COVID-19 2 days prior to procedure.  This will be done at [facility] on ***.  Once you are tested you will need to be in quarantine at home until you have your procedure.   Procedure date and time to be at [facility]: *** ([address], [place] [address])  If you have any questions, you may call Ann at [phone].  You must have a responsible adult available to drive you home and be with you after the procedure.  If you do not have an available adult the hospital will not do the procedure.  Please let us know if you are taking any blood thinners, iron or diabetic medicine if it's not listed below to hold.  You can take your medication as normal unless something is listed below to hold or adjust.   Please stop the following medication: ***  Diabetic medication adjustments: ***  Keep a check on your blood sugar, if they get  low you can drink apple juice, white grape juice or any juice of your choice as long as it is sweet and CLEAR.  On *** you can have regular food and drink until 2 pm and after 2:00 pm start clear liquids and do clear liquids the rest of the day, NO SOLID FOOD AFTER 2:00.  On *** you will continue clear liquids all day today, NO SOLID FOODS.  At 6:00 pm: Drink dose 1 of Plenvu (mango flavour): Mix the dose 1 packet with water to the fill line and stir until dissolved. Drink over the next 30 minutes and follow with 2 large glasses of a clear liquid over the next 30 minutes.  At 9:00 pm: drink dose 2 of Plenvu (fruit punch flavour): Mix the dose 2 packets (A & B) with water to fill line and stir until dissolved. Drink over the next 30 minutes and follow with 2 large glasses of a clear liquid over the next 30 minutes.  Once you finish the solution you  can go back on clear liquids and you can continue clear liquids up until 4 hours to your arrival time at the hospital but nothing to eat or drink during the 4 hour time period before your procedure.  THIS IS A LIST OF CLEAR LIQUIDS THAT YOU CAN HAVE DURING YOUR CLEAR LIQUID TIME: Jell-O (NOT RED), popsicles (NOT RED), apple juice or white grape juice, Kool-Aid (NOT RED), soft drinks (any flavor except cherry), water, coffee or tea (NO CREAM or MILK), broth (beef/chicken/vegetable)  THIS LIST IS WHAT YOU CANNOT HAVE: beer, wine, liquor or any alcoholic beverage, dairy products, Swanson's broth, cranberry juice, tomato juice, V8 juice, grapefruit juice, orange juice, red grape juice, or any solid foods such as cereal, oatmeal, yogurt, fruits, vegetables, creamed soup, eggs, bread, etc.       Educate yourself to understand colonoscopies and your insurance  Types of colonoscopy (preventative, surveillance, or diagnostic)  Diagnostic/Therapeutic colonoscopy (CPT [phone] Colonoscopy, flexible, proximal to splenic flexure; diagnostic, with or without  collection of specimen(s) by brushing or washing, with or without colon decompression (separate procedure)   Patient has a gastrointestinal sign, symptom(s), and/or diagnosis.(rectal bleeding, change in stool, etc.)  Preventive colonoscopy screening (CPT [phone], [phone] Colorectal cancer screening; colonoscopy on individual not meeting criteria for high risk)   Patient is 74 years of age or older  Patient does not have any gastrointestinal sign, symptom(s), and/or relevant diagnosis  Patient does not have any personal history of colon cancer, polyps, and/or gastrointestinal disease  Patient may have a family history of gastrointestinal sign, symptom(s), and/or relevant diagnosis  Surveillance colonoscopy (CPT [phone], [phone])   Patient does not have any gastrointestinal sign, symptom(s), and/or relevant diagnosis.  Patient has a personal history of colon cancer, polyps, and/or gastrointestinal disease.  IMPORTANT When the screening test results in the diagnosis of clinically significant colorectal adenomas or cancer, the patient will be followed by a surveillance regimen and recommendations for screening are no longer applicable.  The only exception is with Medicare.    Your insurance carrier may not pay in full and may apply your colonoscopy to your deductible if polyps are found during your screening exam.    A surveillance exam is not a screening exam.    Please ask your insurance carrier what your benefits are for screening, diagnositc and surveillance.  Your preventative wellness may not pay for your high risk colonoscopy at 100%. Our office DOES NOT check benefits.

## 2019-09-04 ENCOUNTER — Other Ambulatory Visit (HOSPITAL_COMMUNITY)
Admission: RE | Admit: 2019-09-04 | Discharge: 2019-09-04 | Disposition: A | Discharge status: Home / Self Care | Payer: BC Managed Care – PPO | Source: Ambulatory Visit | Attending: Internal Medicine | Admitting: Internal Medicine

## 2019-09-04 ENCOUNTER — Other Ambulatory Visit: Payer: Self-pay

## 2019-09-04 DIAGNOSIS — Z01812 Encounter for preprocedural laboratory examination: Secondary | ICD-10-CM | POA: Insufficient documentation

## 2019-09-04 DIAGNOSIS — Z20822 Contact with and (suspected) exposure to covid-19: Secondary | ICD-10-CM | POA: Diagnosis not present

## 2019-09-05 LAB — SARS CORONAVIRUS 2 (TAT 6-24 HRS): SARS Coronavirus 2: NEGATIVE

## 2019-09-06 ENCOUNTER — Encounter (HOSPITAL_COMMUNITY)
Admission: RE | Disposition: A | Discharge status: Home / Self Care | Payer: Self-pay | Source: Home / Self Care | Attending: Internal Medicine

## 2019-09-06 ENCOUNTER — Other Ambulatory Visit: Payer: Self-pay

## 2019-09-06 ENCOUNTER — Encounter (HOSPITAL_COMMUNITY): Payer: Self-pay | Admitting: Internal Medicine

## 2019-09-06 ENCOUNTER — Ambulatory Visit (HOSPITAL_COMMUNITY)
Admission: RE | Admit: 2019-09-06 | Discharge: 2019-09-06 | Disposition: A | Discharge status: Home / Self Care | Payer: BC Managed Care – PPO | Attending: Internal Medicine | Admitting: Internal Medicine

## 2019-09-06 DIAGNOSIS — Z1211 Encounter for screening for malignant neoplasm of colon: Secondary | ICD-10-CM

## 2019-09-06 DIAGNOSIS — I10 Essential (primary) hypertension: Secondary | ICD-10-CM | POA: Insufficient documentation

## 2019-09-06 DIAGNOSIS — Z79899 Other long term (current) drug therapy: Secondary | ICD-10-CM | POA: Insufficient documentation

## 2019-09-06 DIAGNOSIS — K5730 Diverticulosis of large intestine without perforation or abscess without bleeding: Secondary | ICD-10-CM | POA: Diagnosis not present

## 2019-09-06 DIAGNOSIS — Z96641 Presence of right artificial hip joint: Secondary | ICD-10-CM | POA: Insufficient documentation

## 2019-09-06 DIAGNOSIS — K219 Gastro-esophageal reflux disease without esophagitis: Secondary | ICD-10-CM | POA: Insufficient documentation

## 2019-09-06 DIAGNOSIS — Z86010 Personal history of colonic polyps: Secondary | ICD-10-CM | POA: Diagnosis not present

## 2019-09-06 DIAGNOSIS — D122 Benign neoplasm of ascending colon: Secondary | ICD-10-CM | POA: Diagnosis not present

## 2019-09-06 DIAGNOSIS — Z87891 Personal history of nicotine dependence: Secondary | ICD-10-CM | POA: Diagnosis not present

## 2019-09-06 DIAGNOSIS — D123 Benign neoplasm of transverse colon: Secondary | ICD-10-CM | POA: Diagnosis not present

## 2019-09-06 DIAGNOSIS — M1990 Unspecified osteoarthritis, unspecified site: Secondary | ICD-10-CM | POA: Diagnosis not present

## 2019-09-06 DIAGNOSIS — E785 Hyperlipidemia, unspecified: Secondary | ICD-10-CM | POA: Diagnosis not present

## 2019-09-06 HISTORY — PX: POLYPECTOMY: SHX5525

## 2019-09-06 HISTORY — PX: COLONOSCOPY: SHX5424

## 2019-09-06 SURGERY — COLONOSCOPY
Anesthesia: Moderate Sedation

## 2019-09-06 MED ORDER — MEPERIDINE HCL 50 MG/ML IJ SOLN
INTRAMUSCULAR | Status: AC
  Filled 2019-09-06: qty 1

## 2019-09-06 MED ORDER — MEPERIDINE HCL 50 MG/ML IJ SOLN
INTRAMUSCULAR | Status: DC | PRN
  Administered 2019-09-06 (×2): 25 mg

## 2019-09-06 MED ORDER — SODIUM CHLORIDE 0.9 % IV SOLN: INTRAVENOUS | Status: DC

## 2019-09-06 MED ORDER — MIDAZOLAM HCL 5 MG/5ML IJ SOLN
INTRAMUSCULAR | Status: AC
  Filled 2019-09-06: qty 10

## 2019-09-06 MED ORDER — MIDAZOLAM HCL 5 MG/5ML IJ SOLN
INTRAMUSCULAR | Status: DC | PRN
  Administered 2019-09-06 (×3): 2 mg via INTRAVENOUS

## 2019-09-06 NOTE — Op Note (Signed)
[facility] Patient Name: Blake Solis Procedure Date: 09/06/2019 1:12 PM MRN: [id number] Date of Birth: June 04, 1955 Attending MD: Hildred Laser , MD CSN: [id number] Age: 64 Admit Type: Outpatient Procedure:                Colonoscopy Indications:              High risk colon cancer surveillance: Personal                            history of colonic polyps Providers:                Hildred Laser, MD, Janeece Riggers, RN, Lurline Del, RN,                            Crystal Page Referring MD:             Leonie Douglas, MD Medicines:                Meperidine 50 mg IV, Midazolam 6 mg IV Complications:            No immediate complications. Estimated Blood Loss:     Estimated blood loss was minimal. Procedure:                Pre-Anesthesia Assessment:                           - Prior to the procedure, a History and Physical                            was performed, and patient medications and                            allergies were reviewed. The patient's tolerance of                            previous anesthesia was also reviewed. The risks                            and benefits of the procedure and the sedation                            options and risks were discussed with the patient.                            All questions were answered, and informed consent                            was obtained. Prior Anticoagulants: The patient has                            taken no previous anticoagulant or antiplatelet                            agents. ASA Grade Assessment: II - A patient with  mild systemic disease. After reviewing the risks                            and benefits, the patient was deemed in                            satisfactory condition to undergo the procedure.                           After obtaining informed consent, the colonoscope                            was passed under direct vision. Throughout the                             procedure, the patient's blood pressure, pulse, and                            oxygen saturations were monitored continuously. The                            [id number]-[id number] ([id number]) scope was introduced through                            the anus and advanced to the the cecum, identified                            by appendiceal orifice and ileocecal valve. The                            colonoscopy was performed without difficulty. The                            patient tolerated the procedure well. The quality                            of the bowel preparation was adequate. The                            ileocecal valve, appendiceal orifice, and rectum                            were photographed. Scope In: 1:48:24 PM Scope Out: 2:17:08 PM Scope Withdrawal Time: 0 hours 25 minutes 52 seconds  Total Procedure Duration: 0 hours 28 minutes 44 seconds  Findings:      The perianal and digital rectal examinations were normal.      Three sessile polyps were found in the splenic flexure and ascending       colon. The polyps were small in size. These polyps were removed with a       cold snare. Resection was complete, but the polyp tissue was only       partially retrieved. The pathology specimen was placed into Bottle       Number 1.      A 10 mm polyp  was found in the transverse colon. The polyp was       semi-sessile. The polyp was removed with a hot snare. Resection and       retrieval were complete. The pathology specimen was placed into Bottle       Number 2.      A few diverticula were found in the sigmoid colon.      The retroflexed view of the distal rectum and anal verge was normal and       showed no anal or rectal abnormalities. Impression:               - Three small polyps at the splenic flexure and in                            the ascending colon, removed with a cold snare.                            Complete resection. Partial retrieval(two                             retrieved).                           - One 10 mm polyp in the transverse colon, removed                            with a hot snare. Resected and retrieved.                           - Diverticulosis in the sigmoid colon. Moderate Sedation:      Moderate (conscious) sedation was administered by the endoscopy nurse       and supervised by the endoscopist. The following parameters were       monitored: oxygen saturation, heart rate, blood pressure, CO2       capnography and response to care. Total physician intraservice time was       38 minutes. Recommendation:           - Patient has a contact number available for                            emergencies. The signs and symptoms of potential                            delayed complications were discussed with the                            patient. Return to normal activities tomorrow.                            Written discharge instructions were provided to the                            patient.                           - High fiber diet today.                           -  Continue present medications.                           - No aspirin, ibuprofen, naproxen, or other                            non-steroidal anti-inflammatory drugs for 7 days.                           - Await pathology results.                           - Repeat colonoscopy is recommended. The                            colonoscopy date will be determined after pathology                            results from today's exam become available for                            review. Procedure Code(s):        --- Professional ---                           [phone], Colonoscopy, flexible; with removal of                            tumor(s), polyp(s), or other lesion(s) by snare                            technique                           99153, Moderate sedation; each additional 15                            minutes intraservice time                           99153,  Moderate sedation; each additional 15                            minutes intraservice time                           G0500, Moderate sedation services provided by the                            same physician or other qualified health care                            professional performing a gastrointestinal                            endoscopic service that sedation supports,  requiring the presence of an independent trained                            observer to assist in the monitoring of the                            patient's level of consciousness and physiological                            status; initial 15 minutes of intra-service time;                            patient age 60 years or older (additional time may                            be reported with [phone], as appropriate) Diagnosis Code(s):        --- Professional ---                           K63.5, Polyp of colon                           Z86.010, Personal history of colonic polyps                           K57.30, Diverticulosis of large intestine without                            perforation or abscess without bleeding CPT copyright 2019 American Medical Association. All rights reserved. The codes documented in this report are preliminary and upon coder review may  be revised to meet current compliance requirements. Hildred Laser, MD Hildred Laser, MD 09/06/2019 2:28:18 PM This report has been signed electronically. Number of Addenda: 0

## 2019-09-06 NOTE — H&P (Signed)
Blake Solis is an 64 y.o. male.   Chief Complaint: Patient is here for colonoscopy. HPI: Patient is 64 year old Caucasian male who has been referred for screening colonoscopy.  Patient states he had colonoscopy by Dr. Anthony Sar several years ago and he had up polyp removed.  These records are available in epic.  He had small rectal tubular adenoma removed in April 2012.  He has not had any follow-up exams. He denies abdominal pain change in bowel habit or rectal bleeding. Family history is positive for CRC in brother who was 60 at the time of diagnosis and he died within few months of metastatic disease.  Past Medical History:  Diagnosis Date  . Arthritis   . Bronchitis, allergic   . Dizziness   . Dyslipidemia   . GERD (gastroesophageal reflux disease)   . HTN (hypertension)   . Hypertension   . Overweight(278.02)     Past Surgical History:  Procedure Laterality Date  . CARDIAC CATHETERIZATION    . CYST EXCISION Right    cyst removed from arm  . ELBOW SURGERY Left   . HIP ARTHROPLASTY Right   . KIDNEY DONATION Left   . KNEE ARTHROSCOPY  2013  . lft ulnar nerve removed     decompression  . 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  . SHOULDER ARTHROSCOPY WITH ROTATOR CUFF REPAIR AND SUBACROMIAL DECOMPRESSION Right 10/14/2017   Procedure: RIGHT SHOULDER ARTHROSCOPY WITH EXTENSIVE DEBRIDEMENT, SUBACROMIAL DECOMPRESSION, DISTAL CLAVICLE EXCISION AND BICEPS TENODYSIS;  Surgeon: Leandrew Koyanagi, MD;  Location: [facility];  Service: Orthopedics;  Laterality: Right;  . TOTAL HIP ARTHROPLASTY Right 06/08/2012  . TOTAL HIP ARTHROPLASTY Right 06/07/2012   Procedure: TOTAL HIP ARTHROPLASTY ANTERIOR APPROACH;  Surgeon: Marybelle Killings, MD;  Location: [facility];  Service: Orthopedics;  Laterality: Right;  Right Total Hip Arthroplasty-Anterior Approach    Family History  Problem Relation Age of Onset  . Colon cancer Brother    Social History:  reports that he quit  smoking about 21 years ago. His smoking use included cigarettes. He has a 40.00 pack-year smoking history. He has never used smokeless tobacco. He reports that he does not drink alcohol and does not use drugs.  Allergies: No Known Allergies  Medications Prior to Admission  Medication Sig Dispense Refill  . amLODipine (NORVASC) 10 MG tablet Take 10 mg by mouth daily.    Marland Kitchen losartan (COZAAR) 100 MG tablet Take 100 mg by mouth daily.    . metoprolol succinate (TOPROL-XL) 50 MG 24 hr tablet Take 50 mg by mouth daily.      Results for orders placed or performed during the hospital encounter of 09/04/19 (from the past 48 hour(s))  SARS CORONAVIRUS 2 (TAT 6-24 HRS) Nasopharyngeal Nasopharyngeal Swab     Status: None   Collection Time: 09/04/19  3:00 PM   Specimen: Nasopharyngeal Swab  Result Value Ref Range   SARS Coronavirus 2 NEGATIVE NEGATIVE    Comment: (NOTE) SARS-CoV-2 target nucleic acids are NOT DETECTED.  The SARS-CoV-2 RNA is generally detectable in upper and lower respiratory specimens during the acute phase of infection. Negative results do not preclude SARS-CoV-2 infection, do not rule out co-infections with other pathogens, and should not be used as the sole basis for treatment or other patient management decisions. Negative results must be combined with clinical observations, patient history, and epidemiological information. The expected result is Negative.  Fact Sheet for Patients: SugarRoll.be  Fact Sheet for  Healthcare Providers: https://www.woods-mathews.com/  This test is not yet approved or cleared by the [place] and  has been authorized for detection and/or diagnosis of SARS-CoV-2 by FDA under an Emergency Use Authorization (EUA). This EUA will remain  in effect (meaning this test can be used) for the duration of the COVID-19 declaration under Se ction 564(b)(1) of the Act, 21 U.S.C. section 360bbb-3(b)(1), unless  the authorization is terminated or revoked sooner.  Performed at [facility], [place] [address], [place], [place] [address]    No results found.  Review of Systems  Blood pressure (!) 142/70, pulse (!) 53, temperature 97.8 F (36.6 C), temperature source Oral, resp. rate 18, height 5\' 10"  (1.778 m), weight 112.5 kg, SpO2 98 %. Physical Exam  HENT:  Mouth/Throat: Mucous membranes are moist. Oropharynx is clear.  Eyes: Conjunctivae are normal. No scleral icterus.  Cardiovascular: Normal rate, regular rhythm and normal heart sounds.  No murmur heard. Respiratory: Effort normal and breath sounds normal.  GI:  Abdomen is full.  He has scar in right flank as well as right inguinal region.  Abdomen is soft and nontender with organomegaly or masses.  Musculoskeletal:     Cervical back: Normal range of motion.  Lymphadenopathy:    He has no cervical adenopathy.  Neurological: He is alert.  Skin: Skin is warm and dry.     Assessment/Plan History of colonic adenoma. Family history of colon carcinoma in a first-degree relative at age 24. Surveillance colonoscopy.  Please note patient was scheduled for screening exam but after review of the data in care everywhere indication change to surveillance colonoscopy.  Hildred Laser, MD 09/06/2019, 1:33 PM

## 2019-09-06 NOTE — Discharge Instructions (Signed)
No aspirin or NSAIDs for 1 week. Resume usual medications as before. High-fiber diet. No driving for 24 hours. Physician will call with biopsy results.      Colonoscopy, Adult, Care After This sheet gives you information about how to care for yourself after your procedure. Your doctor may also give you more specific instructions. If you have problems or questions, call your doctor. What can I expect after the procedure? After the procedure, it is common to have:  A small amount of blood in your poop (stool) for 24 hours.  Some gas.  Mild cramping or bloating in your belly (abdomen). Follow these instructions at home: Eating and drinking   Drink enough fluid to keep your pee (urine) pale yellow.  Follow instructions from your doctor about what you cannot eat or drink.  Return to your normal diet as told by your doctor. Avoid heavy or fried foods that are hard to digest. Activity  Rest as told by your doctor.  Do not sit for a long time without moving. Get up to take short walks every 1-2 hours. This is important. Ask for help if you feel weak or unsteady.  Return to your normal activities as told by your doctor. Ask your doctor what activities are safe for you. To help cramping and bloating:   Try walking around.  Put heat on your belly as told by your doctor. Use the heat source that your doctor recommends, such as a moist heat pack or a heating pad. ? Put a towel between your skin and the heat source. ? Leave the heat on for 20-30 minutes. ? Remove the heat if your skin turns bright red. This is very important if you are unable to feel pain, heat, or cold. You may have a greater risk of getting burned. General instructions  For the first 24 hours after the procedure: ? Do not drive or use machinery. ? Do not sign important documents. ? Do not drink alcohol. ? Do your daily activities more slowly than normal. ? Eat foods that are soft and easy to digest.  Take  over-the-counter or prescription medicines only as told by your doctor.  Keep all follow-up visits as told by your doctor. This is important. Contact a doctor if:  You have blood in your poop 2-3 days after the procedure. Get help right away if:  You have more than a small amount of blood in your poop.  You see large clumps of tissue (blood clots) in your poop.  Your belly is swollen.  You feel like you may vomit (nauseous).  You vomit.  You have a fever.  You have belly pain that gets worse, and medicine does not help your pain. Summary  After the procedure, it is common to have a small amount of blood in your poop. You may also have mild cramping and bloating in your belly.  For the first 24 hours after the procedure, do not drive or use machinery, do not sign important documents, and do not drink alcohol.  Get help right away if you have a lot of blood in your poop, feel like you may vomit, have a fever, or have more belly pain. This information is not intended to replace advice given to you by your health care provider. Make sure you discuss any questions you have with your health care provider. Document Revised: 10/02/2018 Document Reviewed: 10/02/2018 Elsevier Patient Education  [place].    Colon Polyps  Polyps are tissue growths  inside the body. Polyps can grow in many places, including the large intestine (colon). A polyp may be a round bump or a mushroom-shaped growth. You could have one polyp or several. Most colon polyps are noncancerous (benign). However, some colon polyps can become cancerous over time. Finding and removing the polyps early can help prevent this. What are the causes? The exact cause of colon polyps is not known. What increases the risk? You are more likely to develop this condition if you:  Have a family history of colon cancer or colon polyps.  Are older than 36 or older than 45 if you are African American.  Have inflammatory  bowel disease, such as ulcerative colitis or Crohn's disease.  Have certain hereditary conditions, such as: ? Familial adenomatous polyposis. ? Lynch syndrome. ? Turcot syndrome. ? Peutz-Jeghers syndrome.  Are overweight.  Smoke cigarettes.  Do not get enough exercise.  Drink too much alcohol.  Eat a diet that is high in fat and red meat and low in fiber.  Had childhood cancer that was treated with abdominal radiation. What are the signs or symptoms? Most polyps do not cause symptoms. If you have symptoms, they may include:  Blood coming from your rectum when having a bowel movement.  Blood in your stool. The stool may look dark red or black.  Abdominal pain.  A change in bowel habits, such as constipation or diarrhea. How is this diagnosed? This condition is diagnosed with a colonoscopy. This is a procedure in which a lighted, flexible scope is inserted into the anus and then passed into the colon to examine the area. Polyps are sometimes found when a colonoscopy is done as part of routine cancer screening tests. How is this treated? Treatment for this condition involves removing any polyps that are found. Most polyps can be removed during a colonoscopy. Those polyps will then be tested for cancer. Additional treatment may be needed depending on the results of testing. Follow these instructions at home: Lifestyle  Maintain a healthy weight, or lose weight if recommended by your health care provider.  Exercise every day or as told by your health care provider.  Do not use any products that contain nicotine or tobacco, such as cigarettes and e-cigarettes. If you need help quitting, ask your health care provider.  If you drink alcohol, limit how much you have: ? 0-1 drink a day for women. ? 0-2 drinks a day for men.  Be aware of how much alcohol is in your drink. In the [place] one drink equals one 12 oz bottle of beer (355 mL), one 5 oz glass of wine (148 mL), or one 1 oz  shot of hard liquor (44 mL). Eating and drinking   Eat foods that are high in fiber, such as fruits, vegetables, and whole grains.  Eat foods that are high in calcium and vitamin D, such as milk, cheese, yogurt, eggs, liver, fish, and broccoli.  Limit foods that are high in fat, such as fried foods and desserts.  Limit the amount of red meat and processed meat you eat, such as hot dogs, sausage, bacon, and lunch meats. General instructions  Keep all follow-up visits as told by your health care provider. This is important. ? This includes having regularly scheduled colonoscopies. ? Talk to your health care provider about when you need a colonoscopy. Contact a health care provider if:  You have new or worsening bleeding during a bowel movement.  You have new or increased blood in  your stool.  You have a change in bowel habits.  You lose weight for no known reason. Summary  Polyps are tissue growths inside the body. Polyps can grow in many places, including the colon.  Most colon polyps are noncancerous (benign), but some can become cancerous over time.  This condition is diagnosed with a colonoscopy.  Treatment for this condition involves removing any polyps that are found. Most polyps can be removed during a colonoscopy. This information is not intended to replace advice given to you by your health care provider. Make sure you discuss any questions you have with your health care provider. Document Revised: 06/23/2017 Document Reviewed: 06/23/2017 Elsevier Patient Education  [place].    High-Fiber Diet Fiber, also called dietary fiber, is a type of carbohydrate that is found in fruits, vegetables, whole grains, and beans. A high-fiber diet can have many health benefits. Your health care provider may recommend a high-fiber diet to help:  Prevent constipation. Fiber can make your bowel movements more regular.  Lower your cholesterol.  Relieve the following  conditions: ? Swelling of veins in the anus (hemorrhoids). ? Swelling and irritation (inflammation) of specific areas of the digestive tract (uncomplicated diverticulosis). ? A problem of the large intestine (colon) that sometimes causes pain and diarrhea (irritable bowel syndrome, IBS).  Prevent overeating as part of a weight-loss plan.  Prevent heart disease, type 2 diabetes, and certain cancers. What is my plan? The recommended daily fiber intake in grams (g) includes:  38 g for men age 56 or younger.  30 g for men over age 66.  31 g for women age 36 or younger.  21 g for women over age 39. You can get the recommended daily intake of dietary fiber by:  Eating a variety of fruits, vegetables, grains, and beans.  Taking a fiber supplement, if it is not possible to get enough fiber through your diet. What do I need to know about a high-fiber diet?  It is better to get fiber through food sources rather than from fiber supplements. There is not a lot of research about how effective supplements are.  Always check the fiber content on the nutrition facts label of any prepackaged food. Look for foods that contain 5 g of fiber or more per serving.  Talk with a diet and nutrition specialist (dietitian) if you have questions about specific foods that are recommended or not recommended for your medical condition, especially if those foods are not listed below.  Gradually increase how much fiber you consume. If you increase your intake of dietary fiber too quickly, you may have bloating, cramping, or gas.  Drink plenty of water. Water helps you to digest fiber. What are tips for following this plan?  Eat a wide variety of high-fiber foods.  Make sure that half of the grains that you eat each day are whole grains.  Eat breads and cereals that are made with whole-grain flour instead of refined flour or white flour.  Eat brown rice, bulgur wheat, or millet instead of white rice.  Start  the day with a breakfast that is high in fiber, such as a cereal that contains 5 g of fiber or more per serving.  Use beans in place of meat in soups, salads, and pasta dishes.  Eat high-fiber snacks, such as berries, raw vegetables, nuts, and popcorn.  Choose whole fruits and vegetables instead of processed forms like juice or sauce. What foods can I eat?  Fruits Berries. Pears.  Apples. Oranges. Avocado. Prunes and raisins. Dried figs. Vegetables Sweet potatoes. Spinach. Kale. Artichokes. Cabbage. Broccoli. Cauliflower. Green peas. Carrots. Squash. Grains Whole-grain breads. Multigrain cereal. Oats and oatmeal. Brown rice. Barley. Bulgur wheat. [place]. Quinoa. Bran muffins. Popcorn. Rye wafer crackers. Meats and other proteins Navy, kidney, and pinto beans. Soybeans. Split peas. Lentils. Nuts and seeds. Dairy Fiber-fortified yogurt. Beverages Fiber-fortified soy milk. Fiber-fortified orange juice. Other foods Fiber bars. The items listed above may not be a complete list of recommended foods and beverages. Contact a dietitian for more options. What foods are not recommended? Fruits Fruit juice. Cooked, strained fruit. Vegetables Fried potatoes. Canned vegetables. Well-cooked vegetables. Grains White bread. Pasta made with refined flour. White rice. Meats and other proteins Fatty cuts of meat. Fried chicken or fried fish. Dairy Milk. Yogurt. Cream cheese. Sour cream. Fats and oils Butters. Beverages Soft drinks. Other foods Cakes and pastries. The items listed above may not be a complete list of foods and beverages to avoid. Contact a dietitian for more information. Summary  Fiber is a type of carbohydrate. It is found in fruits, vegetables, whole grains, and beans.  There are many health benefits of eating a high-fiber diet, such as preventing constipation, lowering blood cholesterol, helping with weight loss, and reducing your risk of heart disease, diabetes, and  certain cancers.  Gradually increase your intake of fiber. Increasing too fast can result in cramping, bloating, and gas. Drink plenty of water while you increase your fiber.  The best sources of fiber include whole fruits and vegetables, whole grains, nuts, seeds, and beans. This information is not intended to replace advice given to you by your health care provider. Make sure you discuss any questions you have with your health care provider. Document Revised: 01/10/2017 Document Reviewed: 01/10/2017 Elsevier Patient Education  2020 [employer].

## 2019-09-10 LAB — SURGICAL PATHOLOGY

## 2019-09-11 ENCOUNTER — Encounter (HOSPITAL_COMMUNITY): Payer: Self-pay | Admitting: Internal Medicine

## 2019-10-02 ENCOUNTER — Encounter: Payer: Self-pay | Admitting: Podiatry

## 2019-10-02 ENCOUNTER — Ambulatory Visit: Payer: BC Managed Care – PPO | Admitting: Podiatry

## 2019-10-02 ENCOUNTER — Other Ambulatory Visit: Payer: Self-pay

## 2019-10-02 VITALS — BP 138/71 | HR 52 | Temp 97.2°F | Resp 16

## 2019-10-02 DIAGNOSIS — B070 Plantar wart: Secondary | ICD-10-CM

## 2019-10-02 NOTE — Progress Notes (Signed)
  Subjective:  Patient ID: Blake Solis, male    DOB: [birth year],  MRN: [id number]  Chief Complaint  Patient presents with  . Painful lesion    Left foot; plantar forefoot-submet 3; pt stated, "I think I have a wart; for the past 2 months, my PCP has been scraping them, said I had plantar warts; he gave me Cephalexin-I finished this"; x3 months    64 y.o. male presents with the above complaint. History confirmed with patient. =  Objective:  Physical Exam: warm, good capillary refill, no trophic changes or ulcerative lesions, normal DP and PT pulses and normal sensory exam. Left Foot: verruca present submet 3-5   Assessment:   1. Verruca plantaris      Plan:  Patient was evaluated and treated and all questions answered. Discussed eitology and treatment options including medical therapy with blistering agents, chemotherapeutics, laser. Recommended we start with cantharone application  Procedure: Destruction of Lesion Location: L foot submet 3-4 Anesthesia: none required Instrumentation: 15 blade. Technique: Debridement of lesion to petechial bleeding. Aperture pad applied around lesion. Small amount of canthrone applied to the base of the lesion. Dressing: Dry, sterile, compression dressing. Disposition: Patient tolerated procedure well. Advised to leave dressing on for 6-8 hours. Thereafter patient to wash the area with soap and water and applied band-aid. Off-loading pads dispensed. Patient to return in 3 weeks for follow-up.   Return in about 3 weeks (around 10/23/2019) for to check on wart left foot.

## 2019-10-02 NOTE — Patient Instructions (Signed)
Take dressing off in 8 hours and wash the foot with soap and water. If it is hurting or becomes uncomfortable before the 8 hours, go ahead and remove the bandage and wash the area.  If it blisters, apply antibiotic ointment and a band-aid.  Monitor for any signs/symptoms of infection. Call the office immediately if any occur or go directly to the emergency room. Call with any questions/concerns.   

## 2019-10-03 ENCOUNTER — Telehealth: Payer: Self-pay | Admitting: Podiatry

## 2019-10-03 NOTE — Telephone Encounter (Signed)
Pt called stating that his foot hurt really bad and he didn't know how he was suppose to work on the foot with the pain he is feeling on his foot please assist Dr.Mcdonald

## 2019-10-03 NOTE — Telephone Encounter (Signed)
I spoke with Blake Solis on the phone and he was experiencing some redness and swelling but no blister formation yet. I told him this is normal following cantharone plus treatment and a blister should form soon and the symptoms begin to resolve. Advised to take tylenol and ibuprofen PRN. If he is feeling worse by Friday or has concerns he will call and I would be happy to see him. He is agreeable with this plan.  Lanae Crumbly, DPM 10/03/2019

## 2019-10-05 ENCOUNTER — Telehealth: Payer: Self-pay | Admitting: Podiatry

## 2019-10-05 NOTE — Telephone Encounter (Signed)
Pt called stating that the bottom of foot is still tener the blister came to a head but pt still says the foot is tender he wanted the Dr to know pt states he was asked to call and give an update to see if he needed to be seen please advise

## 2019-10-05 NOTE — Telephone Encounter (Signed)
I spoke with pt and he states the foot had been sore for days with the blister, but is better now opened. I asked if he had any redness, swelling or cloudy drainage, and pt denied. I told pt to shower like usual and before getting out of the shower, take a clean wash cloth and wet it then squeeze liquid antibacterial soap on the wash cloth and cleanse the area, rinse and apply neosporin ointment bandaid and to perform daily. I told pt that if he did show signs of infection to call. Pt states understanding.

## 2019-10-08 ENCOUNTER — Ambulatory Visit: Payer: BC Managed Care – PPO | Admitting: Podiatry

## 2019-10-09 ENCOUNTER — Other Ambulatory Visit: Payer: Self-pay

## 2019-10-09 ENCOUNTER — Ambulatory Visit: Payer: BC Managed Care – PPO | Admitting: Podiatry

## 2019-10-09 ENCOUNTER — Encounter: Payer: Self-pay | Admitting: Podiatry

## 2019-10-09 VITALS — Temp 97.9°F

## 2019-10-09 DIAGNOSIS — B070 Plantar wart: Secondary | ICD-10-CM

## 2019-10-09 DIAGNOSIS — M79672 Pain in left foot: Secondary | ICD-10-CM

## 2019-10-09 MED ORDER — MELOXICAM 7.5 MG PO TABS
7.5000 mg | ORAL_TABLET | Freq: Every day | ORAL | 0 refills | Status: AC
Start: 2019-10-09 — End: 2019-10-16

## 2019-10-09 NOTE — Progress Notes (Signed)
  Subjective:  Patient ID: Blake Solis, male    DOB: 1955-06-19,  MRN: [id number]  Chief Complaint  Patient presents with  . Verrucous Vulgaris    Follow-up; Follow-up; Left foot; plantar forefoot-submet 3; pt stated, "I got a blister last Tuesday; busted on Thursday; Sunday started to hurt again; Pain 9/10; gets a sharp burning pain"    64 y.o. male presents with the above complaint. History confirmed with patient.  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  Objective:  Physical Exam: warm, good capillary refill, no trophic changes or ulcerative lesions, normal DP and PT pulses and normal sensory exam. Left Foot: Tenderness in area of verruca, no blistering noted currently.  Mild hyperkeratosis.  Verruca looks improved in appearance Assessment:   1. Verruca plantaris   2. Pain in left foot      Plan:  Patient was evaluated and treated and all questions answered.  -Discuss further treatment for him, he is quite tender today in the area of the lesion destruction.  It does look improved although it is somewhat residual today.  He inquired about surgically excising the verruca and I told him that this is an option but would require a plantar incision and for him to be nonweightbearing for 2 to 3 weeks.  I dispensed today a horseshoe aperture pad and metatarsal pad to offload the area and give him more pain relief.  I will also prescribe him a low-dose of Mobic for anti-inflammatory and pain relief.  I also advised him to take Tylenol in addition of the Tylenol 3 he is currently taking.  I will see him back in 3 weeks for reevaluation.   Return in about 3 weeks (around 10/30/2019) for to check on wart.    Lanae Crumbly, DPM 10/09/2019

## 2019-10-25 ENCOUNTER — Ambulatory Visit: Payer: BC Managed Care – PPO | Admitting: Podiatry

## 2019-11-01 ENCOUNTER — Ambulatory Visit: Payer: BC Managed Care – PPO | Admitting: Podiatry

## 2019-11-06 ENCOUNTER — Ambulatory Visit: Payer: BC Managed Care – PPO | Admitting: Podiatry

## 2019-11-07 ENCOUNTER — Ambulatory Visit: Payer: BC Managed Care – PPO | Admitting: Podiatry

## 2019-12-06 ENCOUNTER — Ambulatory Visit: Payer: BC Managed Care – PPO | Admitting: Podiatry

## 2019-12-20 ENCOUNTER — Ambulatory Visit: Payer: BC Managed Care – PPO | Admitting: Podiatry

## 2020-01-21 ENCOUNTER — Encounter: Payer: Self-pay | Admitting: Urology

## 2020-01-21 ENCOUNTER — Other Ambulatory Visit: Payer: Self-pay

## 2020-01-21 ENCOUNTER — Ambulatory Visit (INDEPENDENT_AMBULATORY_CARE_PROVIDER_SITE_OTHER): Payer: BC Managed Care – PPO | Admitting: Urology

## 2020-01-21 VITALS — BP 114/67 | HR 54 | Temp 97.5°F | Ht 70.0 in | Wt 248.0 lb

## 2020-01-21 DIAGNOSIS — N281 Cyst of kidney, acquired: Secondary | ICD-10-CM | POA: Diagnosis not present

## 2020-01-21 DIAGNOSIS — N138 Other obstructive and reflux uropathy: Secondary | ICD-10-CM | POA: Insufficient documentation

## 2020-01-21 DIAGNOSIS — R351 Nocturia: Secondary | ICD-10-CM | POA: Insufficient documentation

## 2020-01-21 DIAGNOSIS — N401 Enlarged prostate with lower urinary tract symptoms: Secondary | ICD-10-CM

## 2020-01-21 LAB — POCT URINALYSIS DIPSTICK
Bilirubin, UA: NEGATIVE
Blood, UA: 2
Glucose, UA: NEGATIVE
Ketones, UA: NEGATIVE
Leukocytes, UA: NEGATIVE
Nitrite, UA: NEGATIVE
Protein, UA: NEGATIVE
Spec Grav, UA: 1.025 (ref 1.010–1.025)
Urobilinogen, UA: 0.2 E.U./dL
pH, UA: 5 (ref 5.0–8.0)

## 2020-01-21 MED ORDER — ALFUZOSIN HCL ER 10 MG PO TB24: 10.0000 mg | ORAL_TABLET | Freq: Every day | ORAL | 11 refills | Status: DC

## 2020-01-21 NOTE — Progress Notes (Signed)
01/21/2020 3:45 PM   Blake Solis Mar 30, 1955 [id number]  Referring provider: Leonie Douglas, MD [address] [place],  [place] [address]  Renal cyst  HPI: Mr Mckim is a 64yo here for evaluation of a right renal cyst found on abdominal US. 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 No hematuria. The patient has a hx of nocturia for the past 5-6 years. He has associated urinary frequency, urgency, and dribbling. Nocturia 3-4x.      PMH: Past Medical History:  Diagnosis Date  . Arthritis   . Bronchitis, allergic   . Dizziness   . Dyslipidemia   . GERD (gastroesophageal reflux disease)   . HTN (hypertension)   . Hypertension   . Overweight(278.02)     Surgical History: Past Surgical History:  Procedure Laterality Date  . CARDIAC CATHETERIZATION    . COLONOSCOPY N/A 09/06/2019   Procedure: COLONOSCOPY;  Surgeon: Rogene Houston, MD;  [facility];  Service: Endoscopy;  Laterality: N/A;  135  . CYST EXCISION Right    cyst removed from arm  . ELBOW SURGERY Left   . HIP ARTHROPLASTY Right   . KIDNEY DONATION Left   . KNEE ARTHROSCOPY  2013  . lft ulnar nerve removed     decompression  . 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  . POLYPECTOMY  09/06/2019   Procedure: POLYPECTOMY;  Surgeon: Rogene Houston, MD;  Location: [facility];  Service: Endoscopy;;  . SHOULDER ARTHROSCOPY WITH ROTATOR CUFF REPAIR AND SUBACROMIAL DECOMPRESSION Right 10/14/2017   Procedure: RIGHT SHOULDER ARTHROSCOPY WITH EXTENSIVE DEBRIDEMENT, SUBACROMIAL DECOMPRESSION, DISTAL CLAVICLE EXCISION AND BICEPS TENODYSIS;  Surgeon: Leandrew Koyanagi, MD;  Location: [facility];  Service: Orthopedics;  Laterality: Right;  . TOTAL HIP ARTHROPLASTY Right 06/08/2012  . TOTAL HIP ARTHROPLASTY Right 06/07/2012   Procedure: TOTAL HIP ARTHROPLASTY ANTERIOR APPROACH;  Surgeon: Marybelle Killings, MD;  Location: [facility];  Service:  Orthopedics;  Laterality: Right;  Right Total Hip Arthroplasty-Anterior Approach    Home Medications:  Allergies as of 01/21/2020   No Known Allergies     Medication List       Accurate as of January 21, 2020  3:45 PM. If you have any questions, ask your nurse or doctor.        amLODipine 10 MG tablet Commonly known as: NORVASC Take 10 mg by mouth daily.   guaiFENesin 600 MG 12 hr tablet Commonly known as: MUCINEX Take by mouth.   losartan 100 MG tablet Commonly known as: COZAAR Take 100 mg by mouth daily.   metoprolol succinate 50 MG 24 hr tablet Commonly known as: TOPROL-XL Take 50 mg by mouth daily.   predniSONE 5 MG tablet Commonly known as: DELTASONE Take 6-5-4-3-2-1 po qd   Stiolto Respimat 2.5-2.5 MCG/ACT Aers Generic drug: Tiotropium Bromide-Olodaterol SMARTSIG:2 Puff(s) By Mouth Daily       Allergies: No Known Allergies  Family History: Family History  Problem Relation Age of Onset  . Colon cancer Brother     Social History:  reports that he quit smoking about 21 years ago. His smoking use included cigarettes. He has a 40.00 pack-year smoking history. He has never used smokeless tobacco. He reports that he does not drink alcohol and does not use drugs.  ROS: All other review of systems were reviewed and are negative except what is noted above in HPI  Physical Exam: BP 114/67   Pulse (!) 54   Temp (!) 97.5 F (36.4 C)   Ht 5\' 10"  (1.778 m)   Wt 248 lb (112.5 kg)   BMI 35.58 kg/m   Constitutional:  Alert and oriented, No acute distress. HEENT: [place] AT, moist mucus membranes.  Trachea midline, no masses. Cardiovascular: No clubbing, cyanosis, or edema. Respiratory: Normal respiratory effort, no increased work of breathing. GI: Abdomen is soft, nontender, nondistended, no abdominal masses GU: No CVA tenderness.  Lymph: No cervical or inguinal lymphadenopathy. Skin: No rashes, bruises or suspicious lesions. Neurologic: Grossly intact, no focal  deficits, moving all 4 extremities. Psychiatric: Normal mood and affect.  Laboratory Data: Lab Results  Component Value Date   WBC 6.2 09/02/2016   HGB 15.3 09/02/2016   HCT 45.0 09/02/2016   MCV 88.9 09/02/2016   PLT 136 (L) 09/02/2016    Lab Results  Component Value Date   CREATININE 1.26 (H) 09/02/2016    No results found for: PSA  No results found for: TESTOSTERONE  No results found for: HGBA1C  Urinalysis    Component Value Date/Time   COLORURINE YELLOW 06/02/2012 1326   APPEARANCEUR CLOUDY (A) 06/02/2012 1326   LABSPEC 1.015 06/02/2012 1326   PHURINE 8.0 06/02/2012 1326   GLUCOSEU NEGATIVE 06/02/2012 1326   HGBUR NEGATIVE 06/02/2012 1326   BILIRUBINUR negative 01/21/2020 1508   [place] 06/02/2012 1326   PROTEINUR Negative 01/21/2020 1508   PROTEINUR NEGATIVE 06/02/2012 1326   UROBILINOGEN 0.2 01/21/2020 1508   UROBILINOGEN 1.0 06/02/2012 1326   NITRITE negative 01/21/2020 1508   NITRITE NEGATIVE 06/02/2012 1326   LEUKOCYTESUR Negative 01/21/2020 1508    No results found for: LABMICR, WBCUA, RBCUA, LABEPIT, MUCUS, BACTERIA  Pertinent Imaging: Abdominal US 12/17/2019: Images reviewed and discussed with the patient  No results found for this or any previous visit.  No results found for this or any previous visit.  No results found for this or any previous visit.  No results found for this or any previous visit.  No results found for this or any previous visit.  No results found for this or any previous visit.  No results found for this or any previous visit.  No results found for this or any previous visit.   Assessment & Plan:    1. Renal cyst -We discussed the natural hx of renal masses and the malignant/benign likelihood. We disucssed the treatment options including active surveillance. Renal ablation, partial and radical nephrectomy. We have elected to proceed with surveillance of his right kidney since the cyst is likely  benign.  2. BPH with LUTS, nocturia -We will trial uroxatral 10mg  qhs -RTC 6 weeks with PVR - POCT urinalysis dipstick   No follow-ups on file.  Nicolette Bang, MD  [facility] [place]

## 2020-01-21 NOTE — Progress Notes (Signed)
Urological Symptom Review  Patient is experiencing the following symptoms: Frequent urination Get up at night to urinate Leakage of urine Erection problems (male only) Penile pain (male only)    Review of Systems  Gastrointestinal (upper)  : Indigestion/heartburn  Gastrointestinal (lower) : Negative for lower GI symptoms  Constitutional : Fatigue  Skin: Negative for skin symptoms  Eyes: Negative for eye symptoms  Ear/Nose/Throat : Negative for Ear/Nose/Throat symptoms  Hematologic/Lymphatic: Negative for Hematologic/Lymphatic symptoms  Cardiovascular : Negative for cardiovascular symptoms  Respiratory : Negative for respiratory symptoms  Endocrine: Negative for endocrine symptoms  Musculoskeletal: Joint pain  Neurological: Headaches Dizziness  Psychologic: Negative for psychiatric symptoms

## 2020-01-21 NOTE — Patient Instructions (Signed)

## 2020-01-22 ENCOUNTER — Ambulatory Visit: Payer: BC Managed Care – PPO | Admitting: Urology

## 2020-01-26 DIAGNOSIS — W57XXXA Bitten or stung by nonvenomous insect and other nonvenomous arthropods, initial encounter: Secondary | ICD-10-CM

## 2020-01-26 HISTORY — DX: Bitten or stung by nonvenomous insect and other nonvenomous arthropods, initial encounter: W57.XXXA

## 2020-02-27 ENCOUNTER — Encounter (INDEPENDENT_AMBULATORY_CARE_PROVIDER_SITE_OTHER): Payer: Self-pay | Admitting: Gastroenterology

## 2020-03-05 ENCOUNTER — Ambulatory Visit: Payer: BC Managed Care – PPO | Admitting: Urology

## 2020-04-28 HISTORY — PX: TRANSTHORACIC ECHOCARDIOGRAM: SHX275

## 2020-04-29 ENCOUNTER — Other Ambulatory Visit: Payer: Self-pay

## 2020-04-29 ENCOUNTER — Ambulatory Visit: Payer: BC Managed Care – PPO | Admitting: Cardiology

## 2020-04-29 ENCOUNTER — Encounter: Payer: Self-pay | Admitting: Cardiology

## 2020-04-29 VITALS — BP 140/72 | HR 50 | Ht 70.0 in | Wt 249.8 lb

## 2020-04-29 DIAGNOSIS — E7849 Other hyperlipidemia: Secondary | ICD-10-CM

## 2020-04-29 DIAGNOSIS — R079 Chest pain, unspecified: Secondary | ICD-10-CM | POA: Diagnosis not present

## 2020-04-29 DIAGNOSIS — I10 Essential (primary) hypertension: Secondary | ICD-10-CM

## 2020-04-29 DIAGNOSIS — E669 Obesity, unspecified: Secondary | ICD-10-CM | POA: Diagnosis not present

## 2020-04-29 HISTORY — DX: Obesity, unspecified: E66.9

## 2020-04-29 NOTE — Assessment & Plan Note (Signed)
I am somewhat concerned about his chest pain.  Symptoms are quite concerning for progressive angina.  Happening with minimal exertion, and sometimes at rest.  Unfortunately, he has a stress test ordered for next week.  At this point I think is not unreasonable to go through with a stress test, it could potentially direct therapy.  Echocardiogram showed abnormal wall motion or significant other findings. I am not convinced that I would believe a negative Myoview, I told him that there is high likelihood that he would consider going directly to cardiac catheterization based on his results.  If there is evidence of ischemia, we will proceed directly with cardiac catheterization and possible PCI.  With concerns about this being potential angina, I will have her restart the calcium blocker and ARB that were stopped during his recent clinic visit.  Plan: Restart amlodipine 10 mg along with losartan 1 mg.  Continue to hold beta-blocker because of bradycardia, anticipate reinitiating with carvedilol if blood pressure still elevated.  If he has significant resting symptoms, he is to go to the ER.  On initial evaluation, I did not order PRN nitroglycerin, however we will call him to let him know that we are prescribing PRN NTG for his chest pain..  On close follow-up, we will need to get lipid panel checked (this can be done at our [place] office).

## 2020-04-29 NOTE — Progress Notes (Signed)
Primary Care Provider: [facility] Cardiologist: Glenetta Hew, MD Electrophysiologist: None  Clinic Note: Chief Complaint  Patient presents with  . New Patient (Initial Visit)    Chest pain or shortness of breath  . Chest Pain   ===================================  ASSESSMENT/PLAN   Problem List Items Addressed This Visit    Chest pain with high risk for cardiac etiology - Primary (Chronic)    I am somewhat concerned about his chest pain.  Symptoms are quite concerning for progressive angina.  Happening with minimal exertion, and sometimes at rest.  Unfortunately, he has a stress test ordered for next week.  At this point I think is not unreasonable to go through with a stress test, it could potentially direct therapy.  Echocardiogram showed abnormal wall motion or significant other findings. I am not convinced that I would believe a negative Myoview, I told him that there is high likelihood that he would consider going directly to cardiac catheterization based on his results.  If there is evidence of ischemia, we will proceed directly with cardiac catheterization and possible PCI.  With concerns about this being potential angina, I will have her restart the calcium blocker and ARB that were stopped during his recent clinic visit.  Plan: Restart amlodipine 10 mg along with losartan 1 mg.  Continue to hold beta-blocker because of bradycardia, anticipate reinitiating with carvedilol if blood pressure still elevated.  If he has significant resting symptoms, he is to go to the ER.  On initial evaluation, I did not order PRN nitroglycerin, however we will call him to let him know that we are prescribing PRN NTG for his chest pain..  On close follow-up, we will need to get lipid panel checked (this can be done at our [place] office).      Relevant Orders   EKG 12-Lead   Essential hypertension (Chronic)    Blood pressure is not that bad today, considering that  his amlodipine and losartan have both been held along with metoprolol.  Plan: Restart amlodipine and losartan, continue to hold metoprolol because of bradycardia.  Readdress blood pressure must receive results of restarting to meds.      Relevant Orders   EKG 12-Lead   Hyperlipidemia due to dietary fat intake (Chronic)    We will need to get lipid panel checked if not at next visit, prior to.  Can get drawn at our [place] office.      Relevant Orders   EKG 12-Lead   Obesity (BMI 35.0-39.9 without comorbidity) (Chronic)    Definitely obese.  This could be contributing to some of the dyspnea and is a risk factor.  Can discuss titrate medication and exercise once we get to the bottom of the chest pain issue.         ===================================  HPI:    Blake Solis is an obese 65 y.o. 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  Recent Hospitalizations:   April 25, 2020-[facility] ER: Evaluated for chest pain and dyspnea.  CTA done to rule out dissection was normal.  Viral panel negative.  Troponins were negative, labs are normal.  Was noted to be bradycardic with heart rate in the 40s.  He did indicate feeling  lightheaded intermittently nauseated and dizzy.  EKG showed heart rate 45 bpm  Chest CTA done to exclude dissection (see below)  [name] was then seen on 04/26/2020 At [facility] for hospital follow-up.  He previously been seen there in November for a tick bite (but otherwise was a new patient).  They noted that when he was in hospital he had chest pain or pressure along with dyspnea.  Also noted to be bradycardic. => At the time of visit, patient not having chest pain  according to the note. => Echo and Lexiscan Myoview Stress Test were ordered.  Echo has been done, but the stress test has not yet been done. -> Because of bradycardia, all of his medications were stopped including amlodipine and losartan.  I am not sure if he heard this correctly.   Reviewed  CV studies:    The following studies were reviewed today: (if available, images/films reviewed: From Epic Chart or Care Everywhere) . Echocardiogram (04/28/2020): [facility]) EF 65 to 70%.  No or WMA.  GR 1 DD.  Mildly thickened aortic valve.  Mild to moderately dilated left atrium.  Normal RV size and function.  Mild RA dilation.: . CTA Chest-Abdomen-Pelvis (04/26/2019): Well-expanded lungs.  Minimal aortic arch atherosclerosis-no dissection.  No pericardial effusion. No PE.  No pneumothorax or pleural effusion.  Normal cardiac size.  Normal pulmonary vascularity.   No active CP disease.  No TIA.   Sigmoid diverticulosis without diverticulitis.  Status post left nephrectomy   Interval History:   Blake Solis presents here today for evaluation of his chest pain.  He tells me that he has stress test scheduled for February 15 -> not exactly sure why he is being seen today prior to his test.  However he is here today indicating that when he went to the ER he had a significant chest pain that occurred with exertion.  He said with activity he felt a tight sensation with shortness of breath.  Episodes can last several minutes but usually resolves with rest.  These symptoms seem to have started may be 2 to 3 weeks prior to his ER visit. He tells me that he still has exertional chest discomfort and shortness of breath that happens with just routine walking.  He also says that he often feels it in the morning when he first wakes up to go to bed.  He sometimes also notes it when he lays down at night. The chest discomfort is often associated with shortness of breath also lightheadedness and dizziness.  He has not had  any syncope or near syncope.  He indicates that he has mild and today swelling that goes down when his legs are elevated.  He denies any significant palpitations or irregular heartbeats.  CV Review of Symptoms (Summary): positive for - chest pain, dyspnea on exertion, edema and Lightheadedness, dizziness associated with chest discomfort and dyspnea. negative for - irregular heartbeat, loss of consciousness, orthopnea, palpitations, paroxysmal nocturnal dyspnea, rapid heart rate, shortness of breath or Near syncope, TIA or amaurosis fugax, claudication  The patient does not have symptoms concerning for COVID-19 infection (fever, chills, cough, or new shortness of breath).   REVIEWED OF SYSTEMS   Review of Systems  Constitutional: Positive for malaise/fatigue (Has not been very energetic or active since the onset of the symptoms about a month ago.). Negative for diaphoresis and weight loss.  HENT: Negative for congestion and nosebleeds.   Respiratory: Positive for shortness of breath (Per HPI.  Not really at rest without chest discomfort). Negative for cough and sputum production.   Gastrointestinal: Negative for abdominal pain, blood in stool and melena.  Genitourinary: Negative for flank pain and hematuria.  Musculoskeletal: Negative for back pain, falls and joint pain.  Neurological: Positive for dizziness. Negative for focal weakness, weakness and headaches.  Psychiatric/Behavioral: Negative for depression and memory loss. The patient is nervous/anxious. The patient does not have insomnia.        He has strange sleep, works in the nighttime shift, so his cold sleepiness during the later day.   I have reviewed and (if needed) personally updated the patient's problem list, medications, allergies, past medical and surgical history, social and family history.   PAST MEDICAL HISTORY   Past Medical History:  Diagnosis Date  . Arthritis   . Bronchitis, allergic   . Essential hypertension     On several medications.  Marland Kitchen GERD (gastroesophageal reflux disease)   . Hyperlipidemia due to dietary fat intake   . Hypertension   . Obesity (BMI 35.0-39.9 without comorbidity) 04/29/2020   BMI 35.8  . Solitary kidney, acquired    Status post left nephrectomy-was a kidney donor for his brother.  . Tick bite 01/26/2020    PAST SURGICAL HISTORY   Past Surgical History:  Procedure Laterality Date  . CARDIAC CATHETERIZATION  04/04/2002   Images reviewed: Normal coronaries  . COLONOSCOPY N/A 09/06/2019   Procedure: COLONOSCOPY;  Surgeon: Rogene Houston, MD;  [facility];  Service: Endoscopy;  Laterality: N/A;  135  . CYST EXCISION Right    cyst removed from arm  . ELBOW SURGERY Left   . HIP ARTHROPLASTY Right   . KIDNEY DONATION Left    Was donor for his brother  . KNEE ARTHROSCOPY  2013  . lft ulnar nerve removed     decompression  . NEPHRECTOMY Left    left, donated to his brother, EF 55-60%...  . POLYPECTOMY  09/06/2019   Procedure: POLYPECTOMY;  Surgeon: Rogene Houston, MD;  [facility];  Service: Endoscopy;;  . SHOULDER ARTHROSCOPY WITH ROTATOR CUFF REPAIR AND SUBACROMIAL DECOMPRESSION Right 10/14/2017   Procedure: RIGHT SHOULDER ARTHROSCOPY WITH EXTENSIVE DEBRIDEMENT, SUBACROMIAL DECOMPRESSION, DISTAL CLAVICLE EXCISION AND BICEPS TENODYSIS;  Surgeon: Leandrew Koyanagi, MD;  Location: [place];  Service: Orthopedics;  Laterality: Right;  . TOTAL HIP ARTHROPLASTY Right 06/07/2012   Procedure: TOTAL HIP ARTHROPLASTY ANTERIOR APPROACH;  Surgeon: Marybelle Killings, MD;  Location: [place];  Service: Orthopedics;  Laterality: Right;  Right Total Hip Arthroplasty-Anterior Approach  . TRANSTHORACIC ECHOCARDIOGRAM  04/28/2020   [facility]) EF 65 to 70%.  No or WMA.  GR 1 DD.  Mildly thickened aortic valve.  Mild to moderately dilated left atrium.  Normal RV size and function.  Mild RA dilation.:    Immunization History  Administered Date(s)  Administered  . Influenza Split 06/20/2010, 06/09/2012  . Influenza-Unspecified 12/25/2018  . Tdap 02/10/2017    MEDICATIONS/ALLERGIES   Current Meds  Medication Sig  . alfuzosin (UROXATRAL) 10 MG 24 hr tablet Take 1 tablet (10 mg total) by mouth daily with breakfast.  . amLODipine (NORVASC) 10 MG tablet Take 10 mg by mouth daily.  Marland Kitchen guaiFENesin (MUCINEX) 600 MG 12 hr tablet Take by mouth.  . losartan (COZAAR) 100 MG tablet Take 100 mg by mouth daily.  . predniSONE (DELTASONE) 5 MG tablet Take 6-5-4-3-2-1 po qd  . STIOLTO RESPIMAT 2.5-2.5 MCG/ACT AERS SMARTSIG:2 Puff(s) By Mouth  Daily  . [DISCONTINUED] metoprolol succinate (TOPROL-XL) 50 MG 24 hr tablet Take 50 mg by mouth daily.    No Known Allergies  SOCIAL HISTORY/FAMILY HISTORY   Reviewed in Epic:  Pertinent findings:  Social History   Tobacco Use  . Smoking status: Former Smoker    Packs/day: 1.00    Years: 40.00    Pack years: 40.00    Types: Cigarettes    Quit date: 04/22/1998    Years since quitting: 22.0  . Smokeless tobacco: Never Used  Vaping Use  . Vaping Use: Never used  Substance Use Topics  . Alcohol use: No  . Drug use: No   Social History   Social History Narrative   He is a married father of 1 daughter, grandfather of 1.     His daughter [name] and grandson lives with him and his wife.      He currently works for [employer] as a Merchant navy officer.   Former smoker about 40 pack years, quit in 2000      Does lots of exercise and physical activity and work, but does not do routine exercise otherwise.       family history includes Arthritis in his sister and sister; Colon cancer (age of onset: 64) in his brother; Diabetes Mellitus II in his brother; Diabetes type II in his sister; Diabetic kidney disease (age of onset: 7) in his brother; Healthy in his sister; Liver disease in his father; Lung cancer (age of onset: 56) in his brother; Neurologic Disorder in his mother; Other (age of onset: 81) in  his sister; Other (age of onset: 39) in his sister; Pancreatic cancer (age of onset: 18) in his brother; Stomach cancer in his sister.   OBJCTIVE -PE, EKG, labs   Wt Readings from Last 3 Encounters:  04/29/20 249 lb 12.8 oz (113.3 kg)  01/21/20 248 lb (112.5 kg)  09/06/19 248 lb (112.5 kg)    Physical Exam: BP 140/72   Pulse (!) 50   Ht '5\' 10"'  (1.778 m)   Wt 249 lb 12.8 oz (113.3 kg)   BMI 35.84 kg/m  Physical Exam Vitals reviewed.  Constitutional:      General: He is not in acute distress.    Appearance: Normal appearance. He is obese. He is not ill-appearing, toxic-appearing or diaphoretic.     Comments: Well-groomed.  HENT:     Head: Normocephalic and atraumatic.  Neck:     Vascular: No carotid bruit, hepatojugular reflux or JVD.  Cardiovascular:     Rate and Rhythm: Normal rate and regular rhythm.  No extrasystoles are present.    Chest Wall: PMI is not displaced.     Pulses: Normal pulses and intact distal pulses.     Heart sounds: S1 normal and S2 normal. Heart sounds are distant. Murmur heard.   Harsh crescendo-decrescendo early systolic murmur is present with a grade of 1/6 at the upper right sternal border radiating to the neck. No friction rub. No gallop.   Pulmonary:     Effort: Pulmonary effort is normal. No respiratory distress.     Breath sounds: Normal breath sounds. No wheezing, rhonchi or rales.  Chest:     Chest wall: No tenderness.  Abdominal:     General: Bowel sounds are normal. There is no distension.     Palpations: There is no mass.     Tenderness: There is no abdominal tenderness.     Comments: Obese.  No HSM  Musculoskeletal:  General: No swelling. Normal range of motion.     Cervical back: Normal range of motion and neck supple.  Skin:    General: Skin is warm and dry.  Neurological:     General: No focal deficit present.     Mental Status: He is alert and oriented to person, place, and time.     Motor: No weakness.     Gait: Gait  normal.  Psychiatric:        Mood and Affect: Mood normal.        Behavior: Behavior normal.        Thought Content: Thought content normal.        Judgment: Judgment normal.     Adult ECG Report  Rate: 50;  Rhythm: sinus bradycardia and Otherwise normal axis, intervals and durations.;   Narrative Interpretation: Borderline EKG.  Recent Labs: No recent lipids.  Labs from ER visit reviewed.  Comprehensive Metabolic Panel Component 16/10/96 09/07/18  Sodium 141 138  Potassium 4.1 3.4Low  Chloride 105 102  Anion Gap 10 15  CO2 25.7 23.8  BUN 15 16  Creatinine 1.09 1.11  BUN/Creatinine Ratio 14 --  EGFR CKD-EPI Non-African American, Male 39 --  EGFR CKD-EPI African American, Male 67 --  Glucose 96 156High  Calcium 8.7 10.0  Albumin 3.9 4.3  Total Protein 7.6 --  Total Bilirubin 0.4 0.7  AST 22 28.6  ALT 21 14  Alkaline Phosphatase 109 92    Ref Range & Units 4 d ago  PRO-BNP 0.0 - 125.0 pg/mL 120.0   hsTroponin I - 6 Hour Component 04/25/20 04/25/20 04/25/20  hsTroponin I '7 9 8  ' delta hsTroponin I 2 1     CBC w/ Differential Component 04/25/20  WBC 7.5  RBC 5.10  HGB 15.3  HCT 44.3  MCV 86.9  MCH 30.0  MCHC 34.5  RDW 13.0  MPV 11.0  Platelet 190   No results found for: CHOL, HDL, LDLCALC, LDLDIRECT, TRIG, CHOLHDL Lab Results  Component Value Date   CREATININE 1.26 (H) 09/02/2016   BUN 15 09/02/2016   NA 136 09/02/2016   K 4.0 09/02/2016   CL 99 (L) 09/02/2016   CO2 24 09/02/2016   CBC Latest Ref Rng & Units 09/02/2016 06/09/2012 06/08/2012  WBC 4.0 - 10.5 K/uL 6.2 9.5 7.8  Hemoglobin 13.0 - 17.0 g/dL 15.3 12.3(L) 14.0  Hematocrit 39.0 - 52.0 % 45.0 34.7(L) 39.9  Platelets 150 - 400 K/uL 136(L) 171 PLATELET CLUMPS NOTED ON SMEAR, UNABLE TO ESTIMATE    No results found for: TSH  ==================================================  COVID-19 Education: The signs and symptoms of COVID-19 were discussed with the patient and how to seek care for  testing (follow up with PCP or arrange E-visit).   The importance of social distancing and COVID-19 vaccination was discussed today. The patient is practicing social distancing & Masking.   I spent a total of 50 minutes with the patient spent in direct patient consultation.  Long discussion about history and his symptoms.  We discussed cardiac catheterization in detail.  Additional time spent with chart review  / charting (studies, outside notes, etc): 48 min => no medical history was in the chart, ER visit and clinic visit reviewed.  Prior studies reviewed and history updated. Total Time: 98 min   Current medicines are reviewed at length with the patient today.  (+/- concerns) N/A  This visit occurred during the SARS-CoV-2 public health emergency.  Safety protocols were in place, including screening  questions prior to the visit, additional usage of staff PPE, and extensive cleaning of exam room while observing appropriate contact time as indicated for disinfecting solutions.  Notice: This dictation was prepared with Dragon dictation along with smaller phrase technology. Any transcriptional errors that result from this process are unintentional and may not be corrected upon review.  Patient Instructions / Medication Changes & Studies & Tests Ordered   Patient Instructions  Medication Instructions:  Restart taking Amlodipine    Losartan   Do nt restart Metopolol    *If you need a refill on your cardiac medications before your next appointment, please call your pharmacy*   Lab Work:  Not needed   Testing/Procedures:   not needed -- keep appointment with stress test  At [place]: At [facility], you and your health needs are our priority.  As part of our continuing mission to provide you with exceptional heart care, we have created designated Provider Care Teams.  These Care Teams include your primary Cardiologist (physician) and Advanced Practice Providers  (APPs -  Physician Assistants and Nurse Practitioners) who all work together to provide you with the care you need, when you need it.     Your next appointment:   2 week(s)  The format for your next appointment:   In Person  Provider:   Glenetta Hew, MD   Other Instructions  Shared Decision Making/Informed Consent The risks [stroke (1 in 1000), death (1 in 1000), kidney failure [usually temporary] (1 in 500), bleeding (1 in 200), allergic reaction [possibly serious] (1 in 200)], benefits (diagnostic support and management of coronary artery disease) and alternatives of a cardiac catheterization were discussed in detail with Mr. Huntsman and he is willing to proceed.   Studies Ordered:   Orders Placed This Encounter  Procedures  . EKG 12-Lead     Glenetta Hew, M.D., M.S. Interventional Cardiologist   Pager # [phone] Phone # [phone] [address] [place], [place] [address]   Thank you for choosing Heartcare at [facility]!!

## 2020-04-29 NOTE — Assessment & Plan Note (Signed)
Definitely obese.  This could be contributing to some of the dyspnea and is a risk factor.  Can discuss titrate medication and exercise once we get to the bottom of the chest pain issue.

## 2020-04-29 NOTE — Patient Instructions (Addendum)
Medication Instructions:  Restart taking Amlodipine    Losartan   Do nt restart Metopolol    *If you need a refill on your cardiac medications before your next appointment, please call your pharmacy*   Lab Work:  Not needed   Testing/Procedures:   not needed -- keep appointment with stress test  At [place]: At [facility], you and your health needs are our priority.  As part of our continuing mission to provide you with exceptional heart care, we have created designated Provider Care Teams.  These Care Teams include your primary Cardiologist (physician) and Advanced Practice Providers (APPs -  Physician Assistants and Nurse Practitioners) who all work together to provide you with the care you need, when you need it.     Your next appointment:   2 week(s)  The format for your next appointment:   In Person  Provider:   Glenetta Hew, MD   Other Instructions

## 2020-04-29 NOTE — Assessment & Plan Note (Addendum)
We will need to get lipid panel checked if not at next visit, prior to.  Can get drawn at our [place] office.

## 2020-04-29 NOTE — Assessment & Plan Note (Signed)
Blood pressure is not that bad today, considering that his amlodipine and losartan have both been held along with metoprolol.  Plan: Restart amlodipine and losartan, continue to hold metoprolol because of bradycardia.  Readdress blood pressure must receive results of restarting to meds.

## 2020-04-30 ENCOUNTER — Telehealth: Payer: Self-pay | Admitting: *Deleted

## 2020-04-30 DIAGNOSIS — E669 Obesity, unspecified: Secondary | ICD-10-CM

## 2020-04-30 DIAGNOSIS — E7849 Other hyperlipidemia: Secondary | ICD-10-CM

## 2020-04-30 DIAGNOSIS — R079 Chest pain, unspecified: Secondary | ICD-10-CM

## 2020-04-30 MED ORDER — NITROGLYCERIN 0.4 MG SL SUBL: 0.4000 mg | SUBLINGUAL_TABLET | SUBLINGUAL | 4 refills | Status: DC | PRN

## 2020-04-30 NOTE — Telephone Encounter (Signed)
04/29/20- Leonie Man, MD  Raiford Simmonds, RN I forgot to tell you that I wanted Mr. Manthei to have PRN NTG. & that we need to have FLP checked (hopefully prior to f/u visit after his ST).   DH   spoke to patient    He is aware he will need to have fasting lipid panel .  Per [facility] - the  patient last lipid was in Sept 2021. ( the results are being faxed)  Patient is aware to go to [facility] .  Patient is also aware to pick prescription for NTG

## 2020-05-03 LAB — LIPID PANEL
Chol/HDL Ratio: 3.8 ratio (ref 0.0–5.0)
Cholesterol, Total: 169 mg/dL (ref 100–199)
HDL: 45 mg/dL (ref >39)
LDL Chol Calc (NIH): 105 mg/dL — ABNORMAL HIGH (ref 0–99)
Triglycerides: 105 mg/dL (ref 0–149)
VLDL Cholesterol Cal: 19 mg/dL (ref 5–40)

## 2020-05-05 ENCOUNTER — Telehealth: Payer: Self-pay | Admitting: Cardiology

## 2020-05-05 NOTE — Telephone Encounter (Signed)
Great news.  Stress test was read as normal.  No evidence of prior heart attack or existing heart artery blockages causing symptoms.  Normal heart pump function.  Low Risk.  This would argue against heart artery disease related chest pain.  Glenetta Hew, MD

## 2020-05-05 NOTE — Telephone Encounter (Signed)
Will route to MD- in chart from care everywhere on 02/07.  Thanks!

## 2020-05-05 NOTE — Telephone Encounter (Signed)
Patient called to make Dr. Ellyn Hack aware that his recent stress test results are available for review. He states he had the stress test with [facility] and the results have been sent to his chart.

## 2020-05-06 NOTE — Telephone Encounter (Signed)
Spoke to patient . The patient has been notified of the result and verbalized understanding.   Patient had a question whether he still needed to have cardiac catheterization  As discussed at last office visit. Patient aware will defer to Dr Ellyn Hack. Patient has a follow up appointment on 05/15/20   Raiford Simmonds, RN 05/06/2020 9:53 AM

## 2020-05-15 ENCOUNTER — Ambulatory Visit: Payer: BC Managed Care – PPO | Admitting: Cardiology

## 2020-06-02 ENCOUNTER — Ambulatory Visit (INDEPENDENT_AMBULATORY_CARE_PROVIDER_SITE_OTHER): Payer: BC Managed Care – PPO | Admitting: Gastroenterology

## 2020-06-12 ENCOUNTER — Ambulatory Visit (INDEPENDENT_AMBULATORY_CARE_PROVIDER_SITE_OTHER): Payer: BC Managed Care – PPO | Admitting: Orthopaedic Surgery

## 2020-06-12 ENCOUNTER — Other Ambulatory Visit: Payer: Self-pay

## 2020-06-12 ENCOUNTER — Ambulatory Visit: Payer: Self-pay

## 2020-06-12 DIAGNOSIS — M25561 Pain in right knee: Secondary | ICD-10-CM | POA: Diagnosis not present

## 2020-06-12 MED ORDER — METHYLPREDNISOLONE ACETATE 40 MG/ML IJ SUSP
40.0000 mg | INTRAMUSCULAR | Status: AC | PRN
  Administered 2020-06-12: 40 mg via INTRA_ARTICULAR

## 2020-06-12 MED ORDER — BUPIVACAINE HCL 0.25 % IJ SOLN
4.0000 mL | INTRAMUSCULAR | Status: AC | PRN
  Administered 2020-06-12: 4 mL via INTRA_ARTICULAR

## 2020-06-12 MED ORDER — LIDOCAINE HCL 1 % IJ SOLN
0.5000 mL | INTRAMUSCULAR | Status: AC | PRN
  Administered 2020-06-12: .5 mL

## 2020-06-12 NOTE — Progress Notes (Signed)
Office Visit Note   Patient: Blake Solis           Date of Birth: 03-07-1956           MRN: [id number] Visit Date: 06/12/2020              Requested by: Practice, [facility] [place],  [place] [address] PCP: [facility], [facility]   Assessment & Plan: Visit Diagnoses:  1. Right knee pain, unspecified chronicity     Plan: right knee injection performed. He will return if he has persistant symptoms.   Follow-Up Instructions: No follow-ups on file.   Orders:  Orders Placed This Encounter  Procedures  . XR KNEE 3 VIEW RIGHT   No orders of the defined types were placed in this encounter.     Procedures: Large Joint Inj: R knee on 06/12/2020 3:18 PM Indications: pain and joint swelling Details: 22 G 1.5 in needle, anterolateral approach  Arthrogram: No  Medications: 40 mg methylPREDNISolone acetate 40 MG/ML; 0.5 mL lidocaine 1 %; 4 mL bupivacaine 0.25 % Outcome: tolerated well, no immediate complications Procedure, treatment alternatives, risks and benefits explained, specific risks discussed. Consent was given by the patient. Immediately prior to procedure a time out was called to verify the correct patient, procedure, equipment, support staff and site/side marked as required. Patient was prepped and draped in the usual sterile fashion.       Clinical Data: No additional findings.   Subjective: Chief Complaint  Patient presents with  . Right Knee - Pain    HPI 65 year old male returns with increased problems with his right knee.  He has been seen in the past 2018 had an MRI showed small Baker's cyst some medial degeneration patellofemoral degenerative changes.  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  Not particularly tender no prepatellar bursitis.  He does have some pain over the proximal portion of the patellar tendon  and also tenderness over the quad tendon distally.  No history of gout.  Negative for falling.  Previous knee arthroscopy with chondroplasty and some meniscus debrided.  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  Shoulder surgery also not giving him problems.  Review of Systems patient is kidney problems cannot take anti-inflammatories all the systems noncontributory to HPI.   Objective: Vital Signs: There were no vitals taken for this visit.  Physical Exam Constitutional:      Appearance: He is well-developed.  HENT:     Head: Normocephalic and atraumatic.  Eyes:     Pupils: Pupils are equal, round, and reactive to light.  Neck:     Thyroid: No thyromegaly.     Trachea: No tracheal deviation.  Cardiovascular:     Rate and Rhythm: Normal rate.  Pulmonary:     Effort: Pulmonary effort is normal.     Breath sounds: No wheezing.  Abdominal:     General: Bowel sounds are normal.     Palpations: Abdomen is soft.  Skin:    General: Skin is warm and dry.     Capillary Refill: Capillary refill takes less than 2 seconds.  Neurological:     Mental Status: He is alert and oriented to person, place, and time.  Psychiatric:        Behavior: Behavior normal.  Thought Content: Thought content normal.        Judgment: Judgment normal.     Ortho Exam patient has calluses over both midportion of the patella thickened but not particularly tender.  He is tender over the right knee distal quad tendon were calcium is palpable in the tendon as well as proximal portion of the patellar tendon adjacent to the patella where there is calcific tendinopathy which is palpable.  He has some tenderness over the medial plica not terribly tender.  Negative patellar subluxation.  Knee reaches full extension.  He ambulates with the right knee limp. Specialty Comments:  No specialty comments available.  Imaging: No results found.   PMFS History: Patient Active Problem List    Diagnosis Date Noted  . Chest pain with high risk for cardiac etiology 04/29/2020  . Renal cyst 01/21/2020  . Benign prostatic hyperplasia with urinary obstruction 01/21/2020  . Nocturia 01/21/2020  . S/P arthroscopy of right shoulder 11/01/2017  . Superior glenoid labrum lesion of right shoulder   . Arthrosis of right acromioclavicular joint   . Tendinopathy of rotator cuff, right   . Impingement syndrome of right shoulder   . Nontraumatic tear of right supraspinatus tendon 09/29/2017  . Cubital tunnel syndrome on left 05/13/2017  . Avascular necrosis of right femoral head ([place]) 06/07/2012    Class: Diagnosis of  . Essential hypertension 07/08/2010  . Hyperlipidemia due to dietary fat intake 07/08/2010  . Prostatitis 07/08/2010  . Degenerative disc disease 07/08/2010  . Hydrocele of testis 07/08/2010  . Obesity (BMI 35.0-39.9 without comorbidity) 09/08/2009   Past Medical History:  Diagnosis Date  . Arthritis   . Bronchitis, allergic   . Essential hypertension    On several medications.  Marland Kitchen GERD (gastroesophageal reflux disease)   . Hyperlipidemia due to dietary fat intake   . Hypertension   . Obesity (BMI 35.0-39.9 without comorbidity) 04/29/2020   BMI 35.8  . Solitary kidney, acquired    Status post left nephrectomy-was a kidney donor for his brother.  . Tick bite 01/26/2020    Family History  Problem Relation Age of Onset  . Colon cancer Brother 69  . Neurologic Disorder Mother        Blake Solis - by report  . Liver disease Father   . Other Sister 63       Natural causes  . Pancreatic cancer Brother 98       Unknown  . Diabetes Mellitus II Brother        Died from DM-Coma @ 79  . Diabetic kidney disease Brother 12       End-stage- s/p Txplant (Blake Solis was the Donor)  . Lung cancer Brother 77  . Other Sister 69  . Healthy Sister        He does not know much details  . Arthritis Sister   . Diabetes type II Sister   . Arthritis Sister   . Stomach cancer  Sister     Past Surgical History:  Procedure Laterality Date  . CARDIAC CATHETERIZATION  04/04/2002   Images reviewed: Normal coronaries  . COLONOSCOPY N/A 09/06/2019   Procedure: COLONOSCOPY;  Surgeon: Rogene Houston, MD;  [facility];  Service: Endoscopy;  Laterality: N/A;  135  . CYST EXCISION Right    cyst removed from arm  . ELBOW SURGERY Left   . HIP ARTHROPLASTY Right   . KIDNEY DONATION Left    Was donor for his brother  . KNEE ARTHROSCOPY  2013  . lft ulnar nerve removed     decompression  . NEPHRECTOMY Left    left, donated to his brother, EF 55-60%...  . POLYPECTOMY  09/06/2019   Procedure: POLYPECTOMY;  Surgeon: Rogene Houston, MD;  [facility];  Service: Endoscopy;;  . SHOULDER ARTHROSCOPY WITH ROTATOR CUFF REPAIR AND SUBACROMIAL DECOMPRESSION Right 10/14/2017   Procedure: RIGHT SHOULDER ARTHROSCOPY WITH EXTENSIVE DEBRIDEMENT, SUBACROMIAL DECOMPRESSION, DISTAL CLAVICLE EXCISION AND BICEPS TENODYSIS;  Surgeon: Leandrew Koyanagi, MD;  Location: [facility];  Service: Orthopedics;  Laterality: Right;  . TOTAL HIP ARTHROPLASTY Right 06/07/2012   Procedure: TOTAL HIP ARTHROPLASTY ANTERIOR APPROACH;  Surgeon: Marybelle Killings, MD;  Location: [facility];  Service: Orthopedics;  Laterality: Right;  Right Total Hip Arthroplasty-Anterior Approach  . TRANSTHORACIC ECHOCARDIOGRAM  04/28/2020   [facility]) EF 65 to 70%.  No or WMA.  GR 1 DD.  Mildly thickened aortic valve.  Mild to moderately dilated left atrium.  Normal RV size and function.  Mild RA dilation.:   Social History   Occupational History  . Occupation: Field seismologist: [employer]    Comment: Now called [employer]  Tobacco Use  . Smoking status: Former Smoker    Packs/day: 1.00    Years: 40.00    Pack years: 40.00    Types: Cigarettes    Quit date: 04/22/1998    Years since quitting: 22.1  . Smokeless tobacco: Never Used  Vaping Use  . Vaping Use: Never used   Substance and Sexual Activity  . Alcohol use: No  . Drug use: No  . Sexual activity: Not on file

## 2020-10-02 ENCOUNTER — Ambulatory Visit: Payer: Self-pay

## 2020-10-02 ENCOUNTER — Ambulatory Visit: Payer: BC Managed Care – PPO | Admitting: Orthopaedic Surgery

## 2020-10-02 ENCOUNTER — Encounter: Payer: Self-pay | Admitting: Orthopaedic Surgery

## 2020-10-02 DIAGNOSIS — M25522 Pain in left elbow: Secondary | ICD-10-CM | POA: Diagnosis not present

## 2020-10-02 NOTE — Progress Notes (Signed)
Office Visit Note   Patient: Blake Solis           Date of Birth: 1956-01-23           MRN: [id number] Visit Date: 10/02/2020              Requested by: Practice, [facility] [place],  [place] [address] PCP: [facility]   Assessment & Plan: Visit Diagnoses:  1. Pain in left elbow     Plan: Impression is recurrent left elbow cubital tunnel syndrome concerning for recurrent ganglion cyst causing the symptoms.  I also believe that he has a component of carpal tunnel syndrome as well.  At this point, we have recommended MRI of the left elbow to determine whether there is a cyst in addition to left nerve conduction study/EMG to look at possible carpal tunnel syndrome.  He will follow-up with [place] once this is been completed.  Follow-Up Instructions: Return for f/u after MRI/NCS.   Orders:  Orders Placed This Encounter  Procedures   XR Elbow Complete Left (3+View)   No orders of the defined types were placed in this encounter.     Procedures: No procedures performed   Clinical Data: No additional findings.   Subjective: Chief Complaint  Patient presents with   Left Elbow - Pain    HPI patient is a pleasant 65 year old left-hand-dominant gentleman who comes in today with recurrent left medial elbow pain and paresthesias.  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 Christella Noa back in 2019 followed by ulnar nerve decompression and anterior transposition by Dr.Ashford Clouse.  It was noted that he had severe ulnar nerve compression at that time.  He was doing well until about 2 to 3 months ago and his symptoms returned.  The past few weeks have been significantly worse.  Any movement of the elbow or flexion of the long finger seems to aggravate his symptoms.  He has noticed paresthesias to the index, long, ring and small fingers but primarily the ring and long fingers.  Review of Systems as detailed in HPI.   All others reviewed and are negative.   Objective: Vital Signs: There were no vitals taken for this visit.  Physical Exam well-developed well-nourished gentleman in no acute distress.  Alert and oriented x3.  Ortho Exam left elbow exam reveals a fully healed surgical scar without complication.  He does have fullness and what feels like a cyst to the medial elbow.  He has moderate tenderness to the medial epicondyle.  He has no pain with elbow flexion, extension, supination or pronation.  He does have a positive Tinel at the wrist and elbow.  Negative Phalen.  Full sensation distally.  Specialty Comments:  No specialty comments available.  Imaging: No results found.   PMFS History: Patient Active Problem List   Diagnosis Date Noted   Chest pain with high risk for cardiac etiology 04/29/2020   Renal cyst 01/21/2020   Benign prostatic hyperplasia with urinary obstruction 01/21/2020   Nocturia 01/21/2020   S/P arthroscopy of right shoulder 11/01/2017   Superior glenoid labrum lesion of right shoulder    Arthrosis of right acromioclavicular joint    Tendinopathy of rotator cuff, right    Impingement syndrome of right shoulder    Nontraumatic tear of right supraspinatus tendon 09/29/2017   Cubital tunnel syndrome on left 05/13/2017   Avascular necrosis of right femoral head ([place]) 06/07/2012  Class: Diagnosis of   Essential hypertension 07/08/2010   Hyperlipidemia due to dietary fat intake 07/08/2010   Prostatitis 07/08/2010   Degenerative disc disease 07/08/2010   Hydrocele of testis 07/08/2010   Obesity (BMI 35.0-39.9 without comorbidity) 09/08/2009   Past Medical History:  Diagnosis Date   Arthritis    Bronchitis, allergic    Essential hypertension    On several medications.   GERD (gastroesophageal reflux disease)    Hyperlipidemia due to dietary fat intake    Hypertension    Obesity (BMI 35.0-39.9 without comorbidity) 04/29/2020   BMI 35.8   Solitary kidney,  acquired    Status post left nephrectomy-was a kidney donor for his brother.   Tick bite 01/26/2020    Family History  Problem Relation Age of Onset   Colon cancer Brother 84   Neurologic Disorder Mother        [name] - by report   Liver disease Father    Other Sister 70       Natural causes   Pancreatic cancer Brother 39       Unknown   Diabetes Mellitus II Brother        Died from DM-Coma @ 47   Diabetic kidney disease Brother 49       End-stage- s/p Txplant (Wilfred was the Donor)   Lung cancer Brother 16   Other Sister 44   Healthy Sister        He does not know much details   Arthritis Sister    Diabetes type II Sister    Arthritis Sister    Stomach cancer Sister     Past Surgical History:  Procedure Laterality Date   CARDIAC CATHETERIZATION  04/04/2002   Images reviewed: Normal coronaries   COLONOSCOPY N/A 09/06/2019   Procedure: COLONOSCOPY;  Surgeon: Rogene Houston, MD;  [facility];  Service: Endoscopy;  Laterality: N/A;  135   CYST EXCISION Right    cyst removed from arm   ELBOW SURGERY Left    HIP ARTHROPLASTY Right    KIDNEY DONATION Left    Was donor for his brother   KNEE ARTHROSCOPY  2013   lft ulnar nerve removed     decompression   NEPHRECTOMY Left    left, donated to his brother, EF 55-60%...   POLYPECTOMY  09/06/2019   Procedure: POLYPECTOMY;  Surgeon: Rogene Houston, MD;  [facility];  Service: Endoscopy;;   SHOULDER ARTHROSCOPY WITH ROTATOR CUFF REPAIR AND SUBACROMIAL DECOMPRESSION Right 10/14/2017   Procedure: RIGHT SHOULDER ARTHROSCOPY WITH EXTENSIVE DEBRIDEMENT, SUBACROMIAL DECOMPRESSION, DISTAL CLAVICLE EXCISION AND BICEPS TENODYSIS;  Surgeon: Leandrew Koyanagi, MD;  Location: [facility];  Service: Orthopedics;  Laterality: Right;   TOTAL HIP ARTHROPLASTY Right 06/07/2012   Procedure: TOTAL HIP ARTHROPLASTY ANTERIOR APPROACH;  Surgeon: Marybelle Killings, MD;  Location: [facility];  Service: Orthopedics;   Laterality: Right;  Right Total Hip Arthroplasty-Anterior Approach   TRANSTHORACIC ECHOCARDIOGRAM  04/28/2020   [facility]) EF 65 to 70%.  No or WMA.  GR 1 DD.  Mildly thickened aortic valve.  Mild to moderately dilated left atrium.  Normal RV size and function.  Mild RA dilation.:   Social History   Occupational History   Occupation: Field seismologist: [employer]    Comment: Now called [employer]  Tobacco Use   Smoking status: Former    Packs/day: 1.00    Years: 40.00    Pack years: 40.00  Types: Cigarettes    Quit date: 04/22/1998    Years since quitting: 22.4   Smokeless tobacco: Never  Vaping Use   Vaping Use: Never used  Substance and Sexual Activity   Alcohol use: No   Drug use: No   Sexual activity: Not on file

## 2020-10-03 ENCOUNTER — Other Ambulatory Visit: Payer: Self-pay

## 2020-10-03 DIAGNOSIS — M25522 Pain in left elbow: Secondary | ICD-10-CM

## 2020-10-07 ENCOUNTER — Telehealth: Payer: Self-pay | Admitting: Orthopaedic Surgery

## 2020-10-07 NOTE — Telephone Encounter (Signed)
Error

## 2020-10-10 ENCOUNTER — Telehealth: Payer: Self-pay | Admitting: Orthopaedic Surgery

## 2020-10-10 NOTE — Telephone Encounter (Signed)
Called pt unable to leave a vm due to no vm set up. Pt need MRI Review appt with Dr. Erlinda Hong after 10/12/20.

## 2020-10-12 ENCOUNTER — Ambulatory Visit
Admission: RE | Admit: 2020-10-12 | Discharge: 2020-10-12 | Disposition: A | Discharge status: Home / Self Care | Payer: BC Managed Care – PPO | Source: Ambulatory Visit | Attending: Orthopaedic Surgery | Admitting: Orthopaedic Surgery

## 2020-10-12 DIAGNOSIS — M25522 Pain in left elbow: Secondary | ICD-10-CM

## 2020-10-13 ENCOUNTER — Telehealth: Payer: Self-pay

## 2020-10-13 NOTE — Telephone Encounter (Signed)
Pt called and would like a return call back from [facility] regarding the nerve conduction study if he still needs to have it done

## 2020-10-14 ENCOUNTER — Other Ambulatory Visit: Payer: BC Managed Care – PPO

## 2020-10-15 NOTE — Telephone Encounter (Signed)
Notes fine not to do it.

## 2020-10-17 ENCOUNTER — Ambulatory Visit: Payer: BC Managed Care – PPO | Admitting: Orthopaedic Surgery

## 2020-10-17 NOTE — Telephone Encounter (Signed)
Called patient no answer could not leave VM. Need to advise on msg below.

## 2020-10-21 ENCOUNTER — Ambulatory Visit: Payer: BC Managed Care – PPO | Admitting: Orthopaedic Surgery

## 2020-10-22 ENCOUNTER — Ambulatory Visit: Payer: BC Managed Care – PPO | Admitting: Orthopaedic Surgery

## 2020-10-28 ENCOUNTER — Ambulatory Visit: Payer: BC Managed Care – PPO | Admitting: Orthopaedic Surgery

## 2020-10-28 ENCOUNTER — Encounter: Payer: Self-pay | Admitting: Orthopaedic Surgery

## 2020-10-28 ENCOUNTER — Other Ambulatory Visit: Payer: Self-pay

## 2020-10-28 DIAGNOSIS — M67422 Ganglion, left elbow: Secondary | ICD-10-CM

## 2020-10-28 DIAGNOSIS — G5622 Lesion of ulnar nerve, left upper limb: Secondary | ICD-10-CM

## 2020-10-28 NOTE — Progress Notes (Signed)
Office Visit Note   Patient: Blake Solis           Date of Birth: Sep 05, 1955           MRN: [id number] Visit Date: 10/28/2020              Requested by: Practice, [facility] [place],  [place] [address] PCP: [facility]   Assessment & Plan: Visit Diagnoses:  1. Cubital tunnel syndrome on left   2. Ganglion of left elbow     Plan: MRI of the left elbow shows stable ulnar nerve transposition.  There is a large multiloculated ganglion cyst within the soft tissues as well as the FCU muscle belly.  There is advanced elbow DJD as well.  Based on his symptoms and our findings he is symptomatic from the cyst.  I wonder if his carpal tunnels syndrome has gotten worse in the last few years.  Given his options he would like to undergo ganglion cyst removal and exploration of the ulnar nerve which will require neurolysis.  Risk benefits rehab recovery alternatives to surgery are reviewed with the patient today.  We will call the patient near future to schedule.  Follow-Up Instructions: Return if symptoms worsen or fail to improve.   Orders:  No orders of the defined types were placed in this encounter.  No orders of the defined types were placed in this encounter.     Procedures: No procedures performed   Clinical Data: No additional findings.   Subjective: Chief Complaint  Patient presents with   Left Elbow - Follow-up    HPI Blake Solis returns today for MRI review of the left elbow.  He states that when the ganglion cyst on the medial side elbow enlarges he gets increased numbness in his hand but mainly in the thumb index and long fingers.  He is status post left cubital tunnel release and ulnar nerve transposition about 3 years ago.  Prior nerve conduction studies did show mild left carpal tunnel syndrome as well.  Review of Systems   Objective: Vital Signs: There were no vitals taken for this visit.  Physical Exam  Ortho Exam Left elbow shows  fully healed surgical scars.  There is palpable large ganglion cyst on the medial side of the elbow.  Ulnar nerve is stable.  Decreased sensation in the thumb, index, long finger.  Specialty Comments:  No specialty comments available.  Imaging: No results found.   PMFS History: Patient Active Problem List   Diagnosis Date Noted   Ganglion of left elbow 10/28/2020   Chest pain with high risk for cardiac etiology 04/29/2020   Renal cyst 01/21/2020   Benign prostatic hyperplasia with urinary obstruction 01/21/2020   Nocturia 01/21/2020   S/P arthroscopy of right shoulder 11/01/2017   Superior glenoid labrum lesion of right shoulder    Arthrosis of right acromioclavicular joint    Tendinopathy of rotator cuff, right    Impingement syndrome of right shoulder    Nontraumatic tear of right supraspinatus tendon 09/29/2017   Cubital tunnel syndrome on left 05/13/2017   Avascular necrosis of right femoral head ([place]) 06/07/2012    Class: Diagnosis of   Essential hypertension 07/08/2010   Hyperlipidemia due to dietary fat intake 07/08/2010   Prostatitis 07/08/2010   Degenerative disc disease 07/08/2010   Hydrocele of testis 07/08/2010   Obesity (BMI 35.0-39.9 without comorbidity) 09/08/2009   Past Medical History:  Diagnosis Date   Arthritis    Bronchitis,  allergic    Essential hypertension    On several medications.   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   Tick bite 01/26/2020    Family History  Problem Relation Age of Onset   Colon cancer Brother 76   Neurologic Disorder Mother        [name] - by report   Liver disease Father    Other Sister 39       Natural causes   Pancreatic cancer Brother 28       Unknown   Diabetes Mellitus II  Brother        Died from DM-Coma @ 108   Diabetic kidney disease Brother 58       End-stage- s/p Txplant (Cardale was the Donor)   Lung cancer Brother 76   Other Sister 37   Healthy Sister        He does not know much details   Arthritis Sister    Diabetes type II Sister    Arthritis Sister    Stomach cancer Sister     Past Surgical History:  Procedure Laterality Date   CARDIAC CATHETERIZATION  04/04/2002   Images reviewed: Normal coronaries   COLONOSCOPY N/A 09/06/2019   Procedure: COLONOSCOPY;  Surgeon: Rogene Houston, MD;  [facility];  Service: Endoscopy;  Laterality: N/A;  135   CYST EXCISION Right    cyst removed from arm   ELBOW SURGERY Left    HIP ARTHROPLASTY Right    KIDNEY DONATION Left    Was donor for his brother   KNEE ARTHROSCOPY  2013   lft ulnar nerve removed     decompression   NEPHRECTOMY Left    left, donated to his brother, EF 55-60%...   POLYPECTOMY  09/06/2019   Procedure: POLYPECTOMY;  Surgeon: Rogene Houston, MD;  [facility];  Service: Endoscopy;;   SHOULDER ARTHROSCOPY WITH ROTATOR CUFF REPAIR AND SUBACROMIAL DECOMPRESSION Right 10/14/2017   Procedure: RIGHT SHOULDER ARTHROSCOPY WITH EXTENSIVE DEBRIDEMENT, SUBACROMIAL DECOMPRESSION, DISTAL CLAVICLE EXCISION AND BICEPS TENODYSIS;  Surgeon: Leandrew Koyanagi, MD;  Location: [facility];  Service: Orthopedics;  Laterality: Right;   TOTAL HIP ARTHROPLASTY Right 06/07/2012   Procedure: TOTAL HIP ARTHROPLASTY ANTERIOR APPROACH;  Surgeon: Marybelle Killings, MD;  Location: [facility];  Service: Orthopedics;  Laterality: Right;  Right Total Hip Arthroplasty-Anterior Approach   TRANSTHORACIC ECHOCARDIOGRAM  04/28/2020   [facility]) EF 65 to 70%.  No or WMA.  GR 1 DD.  Mildly thickened aortic valve.  Mild to moderately dilated left atrium.  Normal RV size and function.  Mild RA dilation.:   Social History   Occupational History   Occupation: Field seismologist: [employer]    Comment: Now called [employer]  Tobacco Use   Smoking status: Former    Packs/day: 1.00    Years: 40.00    Pack years: 40.00    Types: Cigarettes    Quit date: 04/22/1998    Years since quitting: 22.5   Smokeless tobacco: Never  Vaping Use   Vaping Use: Never used  Substance and Sexual Activity   Alcohol use: No   Drug use: No   Sexual activity: Not on file

## 2020-11-11 ENCOUNTER — Other Ambulatory Visit: Payer: Self-pay

## 2020-11-11 ENCOUNTER — Encounter (HOSPITAL_BASED_OUTPATIENT_CLINIC_OR_DEPARTMENT_OTHER): Payer: Self-pay | Admitting: Orthopaedic Surgery

## 2020-11-13 ENCOUNTER — Ambulatory Visit (HOSPITAL_BASED_OUTPATIENT_CLINIC_OR_DEPARTMENT_OTHER)
Admission: RE | Admit: 2020-11-13 | Discharge: 2020-11-13 | Disposition: A | Discharge status: Home / Self Care | Payer: BC Managed Care – PPO | Attending: Orthopaedic Surgery | Admitting: Orthopaedic Surgery

## 2020-11-13 ENCOUNTER — Ambulatory Visit (HOSPITAL_BASED_OUTPATIENT_CLINIC_OR_DEPARTMENT_OTHER): Payer: BC Managed Care – PPO | Admitting: Anesthesiology

## 2020-11-13 ENCOUNTER — Encounter (HOSPITAL_BASED_OUTPATIENT_CLINIC_OR_DEPARTMENT_OTHER)
Admission: RE | Disposition: A | Discharge status: Home / Self Care | Payer: Self-pay | Source: Home / Self Care | Attending: Orthopaedic Surgery

## 2020-11-13 ENCOUNTER — Other Ambulatory Visit: Payer: Self-pay

## 2020-11-13 ENCOUNTER — Encounter (HOSPITAL_BASED_OUTPATIENT_CLINIC_OR_DEPARTMENT_OTHER): Payer: Self-pay | Admitting: Orthopaedic Surgery

## 2020-11-13 DIAGNOSIS — G5622 Lesion of ulnar nerve, left upper limb: Secondary | ICD-10-CM | POA: Diagnosis not present

## 2020-11-13 DIAGNOSIS — Z87891 Personal history of nicotine dependence: Secondary | ICD-10-CM | POA: Insufficient documentation

## 2020-11-13 DIAGNOSIS — Z6833 Body mass index (BMI) 33.0-33.9, adult: Secondary | ICD-10-CM | POA: Diagnosis not present

## 2020-11-13 DIAGNOSIS — Z79899 Other long term (current) drug therapy: Secondary | ICD-10-CM | POA: Insufficient documentation

## 2020-11-13 DIAGNOSIS — M67422 Ganglion, left elbow: Secondary | ICD-10-CM | POA: Diagnosis not present

## 2020-11-13 DIAGNOSIS — E669 Obesity, unspecified: Secondary | ICD-10-CM | POA: Diagnosis not present

## 2020-11-13 HISTORY — PX: ULNAR NERVE TRANSPOSITION: SHX2595

## 2020-11-13 SURGERY — ULNAR NERVE DECOMPRESSION/TRANSPOSITION
Anesthesia: Monitor Anesthesia Care | Site: Elbow | Laterality: Left

## 2020-11-13 MED ORDER — IBUPROFEN 100 MG/5ML PO SUSP: 200.0000 mg | Freq: Four times a day (QID) | ORAL | Status: DC | PRN

## 2020-11-13 MED ORDER — DEXAMETHASONE SODIUM PHOSPHATE 10 MG/ML IJ SOLN
INTRAMUSCULAR | Status: DC | PRN
  Administered 2020-11-13: 10 mg

## 2020-11-13 MED ORDER — PROPOFOL 10 MG/ML IV BOLUS
INTRAVENOUS | Status: DC | PRN
  Administered 2020-11-13 (×4): 10 mg via INTRAVENOUS

## 2020-11-13 MED ORDER — PROPOFOL 10 MG/ML IV BOLUS
INTRAVENOUS | Status: AC
  Filled 2020-11-13: qty 20

## 2020-11-13 MED ORDER — ONDANSETRON HCL 4 MG/2ML IJ SOLN: 4.0000 mg | Freq: Once | INTRAMUSCULAR | Status: DC | PRN

## 2020-11-13 MED ORDER — MEPERIDINE HCL 25 MG/ML IJ SOLN: 6.2500 mg | INTRAMUSCULAR | Status: DC | PRN

## 2020-11-13 MED ORDER — 0.9 % SODIUM CHLORIDE (POUR BTL) OPTIME
TOPICAL | Status: DC | PRN
  Administered 2020-11-13: 500 mL

## 2020-11-13 MED ORDER — ONDANSETRON HCL 4 MG/2ML IJ SOLN
INTRAMUSCULAR | Status: DC | PRN
  Administered 2020-11-13: 4 mg via INTRAVENOUS

## 2020-11-13 MED ORDER — FENTANYL CITRATE (PF) 100 MCG/2ML IJ SOLN
INTRAMUSCULAR | Status: AC
  Filled 2020-11-13: qty 2

## 2020-11-13 MED ORDER — IBUPROFEN 200 MG PO TABS: 200.0000 mg | ORAL_TABLET | Freq: Four times a day (QID) | ORAL | Status: DC | PRN

## 2020-11-13 MED ORDER — FENTANYL CITRATE (PF) 100 MCG/2ML IJ SOLN
100.0000 ug | Freq: Once | INTRAMUSCULAR | Status: AC
  Administered 2020-11-13: 100 ug via INTRAVENOUS

## 2020-11-13 MED ORDER — PROPOFOL 500 MG/50ML IV EMUL
INTRAVENOUS | Status: AC
  Filled 2020-11-13: qty 50

## 2020-11-13 MED ORDER — CEFAZOLIN SODIUM-DEXTROSE 2-4 GM/100ML-% IV SOLN
INTRAVENOUS | Status: AC
  Filled 2020-11-13: qty 100

## 2020-11-13 MED ORDER — OXYCODONE HCL 5 MG PO TABS
5.0000 mg | ORAL_TABLET | Freq: Once | ORAL | Status: AC | PRN
  Administered 2020-11-13: 5 mg via ORAL

## 2020-11-13 MED ORDER — ROPIVACAINE HCL 7.5 MG/ML IJ SOLN
INTRAMUSCULAR | Status: DC | PRN
  Administered 2020-11-13 (×4): 5 mL via PERINEURAL

## 2020-11-13 MED ORDER — LACTATED RINGERS IV SOLN: INTRAVENOUS | Status: DC

## 2020-11-13 MED ORDER — HYDROCODONE-ACETAMINOPHEN 7.5-325 MG PO TABS: 1.0000 | ORAL_TABLET | Freq: Two times a day (BID) | ORAL | 0 refills | Status: DC | PRN

## 2020-11-13 MED ORDER — CLONIDINE HCL (ANALGESIA) 100 MCG/ML EP SOLN
EPIDURAL | Status: DC | PRN
  Administered 2020-11-13: 100 ug

## 2020-11-13 MED ORDER — CEFAZOLIN SODIUM-DEXTROSE 2-4 GM/100ML-% IV SOLN
2.0000 g | INTRAVENOUS | Status: AC
  Administered 2020-11-13: 2 g via INTRAVENOUS

## 2020-11-13 MED ORDER — FENTANYL CITRATE (PF) 100 MCG/2ML IJ SOLN
25.0000 ug | INTRAMUSCULAR | Status: DC | PRN
  Administered 2020-11-13: 25 ug via INTRAVENOUS

## 2020-11-13 MED ORDER — MIDAZOLAM HCL 2 MG/2ML IJ SOLN
INTRAMUSCULAR | Status: AC
  Filled 2020-11-13: qty 2

## 2020-11-13 MED ORDER — ONDANSETRON HCL 4 MG/2ML IJ SOLN
INTRAMUSCULAR | Status: AC
  Filled 2020-11-13: qty 2

## 2020-11-13 MED ORDER — MIDAZOLAM HCL 2 MG/2ML IJ SOLN
2.0000 mg | Freq: Once | INTRAMUSCULAR | Status: AC
  Administered 2020-11-13: 2 mg via INTRAVENOUS

## 2020-11-13 MED ORDER — PROPOFOL 500 MG/50ML IV EMUL
INTRAVENOUS | Status: DC | PRN
  Administered 2020-11-13: 50 ug/kg/min via INTRAVENOUS

## 2020-11-13 MED ORDER — OXYCODONE HCL 5 MG PO TABS
ORAL_TABLET | ORAL | Status: AC
  Filled 2020-11-13: qty 1

## 2020-11-13 MED ORDER — ACETAMINOPHEN 10 MG/ML IV SOLN: 1000.0000 mg | Freq: Once | INTRAVENOUS | Status: DC | PRN

## 2020-11-13 MED ORDER — OXYCODONE HCL 5 MG/5ML PO SOLN: 5.0000 mg | Freq: Once | ORAL | Status: AC | PRN

## 2020-11-13 SURGICAL SUPPLY — 61 items
BLADE SURG 15 STRL LF DISP TIS (BLADE) ×1 IMPLANT
BLADE SURG 15 STRL SS (BLADE) ×2
BNDG CMPR 9X4 STRL LF SNTH (GAUZE/BANDAGES/DRESSINGS) ×1
BNDG ELASTIC 3X5.8 VLCR STR LF (GAUZE/BANDAGES/DRESSINGS) ×2 IMPLANT
BNDG ELASTIC 4X5.8 VLCR STR LF (GAUZE/BANDAGES/DRESSINGS) ×2 IMPLANT
BNDG ESMARK 4X9 LF (GAUZE/BANDAGES/DRESSINGS) ×2 IMPLANT
BRUSH SCRUB EZ PLAIN DRY (MISCELLANEOUS) ×2 IMPLANT
CORD BIPOLAR FORCEPS 12FT (ELECTRODE) ×2 IMPLANT
COVER BACK TABLE 60X90IN (DRAPES) ×2 IMPLANT
COVER MAYO STAND STRL (DRAPES) ×2 IMPLANT
CUFF TOURN SGL QUICK 18X3 (MISCELLANEOUS) ×3 IMPLANT
CUFF TOURN SGL QUICK 18X4 (TOURNIQUET CUFF) IMPLANT
DRAPE EXTREMITY T 121X128X90 (DISPOSABLE) ×2 IMPLANT
DRAPE SURG 17X23 STRL (DRAPES) ×2 IMPLANT
DRSG PAD ABDOMINAL 8X10 ST (GAUZE/BANDAGES/DRESSINGS) ×1 IMPLANT
GAUZE SPONGE 4X4 12PLY STRL (GAUZE/BANDAGES/DRESSINGS) ×2 IMPLANT
GAUZE XEROFORM 1X8 LF (GAUZE/BANDAGES/DRESSINGS) ×2 IMPLANT
GLOVE SURG NEOP MICRO LF SZ7.5 (GLOVE) ×2 IMPLANT
GLOVE SURG SYN 7.5  E (GLOVE) ×2
GLOVE SURG SYN 7.5 E (GLOVE) ×1 IMPLANT
GLOVE SURG SYN 7.5 PF PI (GLOVE) ×1 IMPLANT
GLOVE SURG UNDER POLY LF SZ7 (GLOVE) ×3 IMPLANT
GLOVE SURG UNDER POLY LF SZ7.5 (GLOVE) ×2 IMPLANT
GOWN STRL REIN XL XLG (GOWN DISPOSABLE) ×2 IMPLANT
GOWN STRL REUS W/ TWL LRG LVL3 (GOWN DISPOSABLE) ×1 IMPLANT
GOWN STRL REUS W/ TWL XL LVL3 (GOWN DISPOSABLE) ×1 IMPLANT
GOWN STRL REUS W/TWL LRG LVL3 (GOWN DISPOSABLE) ×2
GOWN STRL REUS W/TWL XL LVL3 (GOWN DISPOSABLE) ×2
LOOP VESSEL MAXI BLUE (MISCELLANEOUS) ×1 IMPLANT
NDL HYPO 25X1 1.5 SAFETY (NEEDLE) IMPLANT
NEEDLE HYPO 25X1 1.5 SAFETY (NEEDLE) IMPLANT
NS IRRIG 1000ML POUR BTL (IV SOLUTION) ×2 IMPLANT
PACK BASIN DAY SURGERY FS (CUSTOM PROCEDURE TRAY) ×2 IMPLANT
PAD CAST 3X4 CTTN HI CHSV (CAST SUPPLIES) ×1 IMPLANT
PADDING CAST ABS 4INX4YD NS (CAST SUPPLIES)
PADDING CAST ABS COTTON 4X4 ST (CAST SUPPLIES) IMPLANT
PADDING CAST COTTON 3X4 STRL (CAST SUPPLIES) ×2
PADDING CAST SYNTHETIC 3 NS LF (CAST SUPPLIES)
PADDING CAST SYNTHETIC 3X4 NS (CAST SUPPLIES) IMPLANT
PADDING CAST SYNTHETIC 4 (CAST SUPPLIES)
PADDING CAST SYNTHETIC 4X4 STR (CAST SUPPLIES) IMPLANT
SHEET MEDIUM DRAPE 40X70 STRL (DRAPES) ×2 IMPLANT
SLEEVE SCD COMPRESS KNEE MED (STOCKING) ×2 IMPLANT
SLING ARM FOAM STRAP LRG (SOFTGOODS) ×2 IMPLANT
SPLINT FIBERGLASS 3X35 (CAST SUPPLIES) ×1 IMPLANT
SPLINT PLASTER CAST XFAST 3X15 (CAST SUPPLIES) IMPLANT
SPLINT PLASTER XTRA FASTSET 3X (CAST SUPPLIES)
STOCKINETTE 4X48 STRL (DRAPES) ×2 IMPLANT
STRIP CLOSURE SKIN 1/4X4 (GAUZE/BANDAGES/DRESSINGS) IMPLANT
SUT ETHILON 4 0 PS 2 18 (SUTURE) ×4 IMPLANT
SUT VIC AB 2-0 CT1 27 (SUTURE)
SUT VIC AB 2-0 CT1 TAPERPNT 27 (SUTURE) IMPLANT
SUT VIC AB 3-0 PS2 18 (SUTURE) ×6 IMPLANT
SUT VIC AB 4-0 P-3 18XBRD (SUTURE) IMPLANT
SUT VIC AB 4-0 P2 18 (SUTURE) IMPLANT
SUT VIC AB 4-0 P3 18 (SUTURE)
SYR BULB EAR ULCER 3OZ GRN STR (SYRINGE) ×2 IMPLANT
SYR CONTROL 10ML LL (SYRINGE) IMPLANT
TOWEL GREEN STERILE FF (TOWEL DISPOSABLE) ×4 IMPLANT
TRAY DSU PREP LF (CUSTOM PROCEDURE TRAY) ×2 IMPLANT
UNDERPAD 30X36 HEAVY ABSORB (UNDERPADS AND DIAPERS) ×2 IMPLANT

## 2020-11-13 NOTE — Progress Notes (Signed)
Assisted Dr. Hatchett with left, ultrasound guided, supraclavicular block. Side rails up, monitors on throughout procedure. See vital signs in flow sheet. Tolerated Procedure well. 

## 2020-11-13 NOTE — Op Note (Signed)
Date of Surgery: 11/13/2020  INDICATIONS: Mr. Ouimette is a 65 y.o.-year-old male with left ganglion cyst that failed conservative treatment.  The Patient did consent to the procedure after discussion of the risks and benefits.  PREOPERATIVE DIAGNOSIS:  1.  Left elbow intramuscular complex and multiloculated ganglion cyst 8 cm x 1 cm 2.  Status post left cubital tunnel release with anterior subcutaneous transposition  POSTOPERATIVE DIAGNOSIS: Same.  PROCEDURE:  1.  Excision of complex and multiloculated ganglion cyst a centimeters by 1 cm from the left medial elbow 2.  Neurolysis of left ulnar nerve left elbow  SURGEON: N. Eduard Roux, M.D.  ASSIST: Ciro Backer [place], [place]; necessary for the timely completion of procedure and due to complexity of procedure.  ANESTHESIA: Regional  IV FLUIDS AND URINE: See anesthesia.  ESTIMATED BLOOD LOSS: Minimal mL.  IMPLANTS: None  DRAINS: None  COMPLICATIONS: see description of procedure.  DESCRIPTION OF PROCEDURE: The patient was brought to the operating room.  The patient had been signed prior to the procedure and this was documented. The patient had the anesthesia placed by the anesthesiologist.  A time-out was performed to confirm that this was the correct patient, site, side and location. The patient did receive antibiotics prior to the incision and was re-dosed during the procedure as needed at indicated intervals.  A tourniquet was placed.  The patient had the operative extremity prepped and draped in the standard surgical fashion.    The previous surgical scar was incised and extended distally.  Dissection was carried down through the subcutaneous tissue.  There was extensive scarring from the surgery.  I took great care to dissect through this layer.  Full-thickness flaps were raised.  I used finger palpation to localize medial epicondyle.  The ganglion cyst was identified throughout herniation of the FCU fascia.  I first turned my  attention to identifying the ulnar nerve due to its new position previous surgery.  I was able to identify the ulnar nerve just proximal to the medial epicondyle through scar tissue.  I carefully dissected the nerve out of the scar tissue and placed a vessel loop along it.  I then continued my dissection of the ulnar nerve distally as it entered into the FCU muscle bellies.  Neurolysis of the ulnar nerve was performed for its entire length in the surgical wound.  Once the nerve was identified and out of the way I then turned my attention to removing the ganglion cyst.  The cyst was multiloculated and irregular.  The cyst was well adhered to the surrounding muscle.  It took me quite a bit of time to slightly dissect out the cyst from the surrounding FCU muscle belly.  Once I felt that I got visualization of the entire cyst this was then removed and 3 large pieces with tenotomy scissors.  There was a large amount of ganglion cyst fluid that was expressed from this.  This was sent for pathology.  The cyst measured approximately 8 x 1 cm.  The surgical site was then thoroughly irrigated.  The ulnar nerve was left in place.  Hemostasis was obtained.  Surgical site was closed in a layered fashion.  Sterile compressive dressings were applied and the elbow was placed in 90 degrees in a long-arm splint.  Patient tolerated procedure well had no many complications.  Tawanna Cooler was necessary for opening, closing, retracting, limb positioning and overall facilitation and timely completion of the procedure.  POSTOPERATIVE PLAN: Patient will follow-up in 2 weeks  for suture removal and initiation of elbow motion.  Azucena Cecil, MD 3:56 PM

## 2020-11-13 NOTE — Discharge Instructions (Addendum)
Postoperative instructions:  Weightbearing instructions: no lifting more than 10 lbs  Keep your dressing and/or splint clean and dry at all times.  You can remove your dressing on post-operative day #3 and change with a dry/sterile dressing or Band-Aids as needed thereafter.    Incision instructions:  Do not soak your incision for 3 weeks after surgery.  If the incision gets wet, pat dry and do not scrub the incision.  Pain control:  You have been given a prescription to be taken as directed for post-operative pain control.  In addition, elevate the operative extremity above the heart at all times to prevent swelling and throbbing pain.  Take over-the-counter Colace, '100mg'$  by mouth twice a day while taking narcotic pain medications to help prevent constipation.  Follow up appointments: 1) 14 days for suture removal and wound check. 2) Dr. Erlinda Hong as scheduled.   -------------------------------------------------------------------------------------------------------------  After Surgery Pain Control:  After your surgery, post-surgical discomfort or pain is likely. This discomfort can last several days to a few weeks. At certain times of the day your discomfort may be more intense.  Did you receive a nerve block?  A nerve block can provide pain relief for one hour to two days after your surgery. As long as the nerve block is working, you will experience little or no sensation in the area the surgeon operated on.  As the nerve block wears off, you will begin to experience pain or discomfort. It is very important that you begin taking your prescribed pain medication before the nerve block fully wears off. Treating your pain at the first sign of the block wearing off will ensure your pain is better controlled and more tolerable when full-sensation returns. Do not wait until the pain is intolerable, as the medicine will be less effective. It is better to treat pain in advance than to try and catch up.   General Anesthesia:  If you did not receive a nerve block during your surgery, you will need to start taking your pain medication shortly after your surgery and should continue to do so as prescribed by your surgeon.  Pain Medication:  Most commonly we prescribe Vicodin and Percocet for post-operative pain. Both of these medications contain a combination of acetaminophen (Tylenol) and a narcotic to help control pain.   It takes between 30 and 45 minutes before pain medication starts to work. It is important to take your medication before your pain level gets too intense.   Nausea is a common side effect of many pain medications. You will want to eat something before taking your pain medicine to help prevent nausea.   If you are taking a prescription pain medication that contains acetaminophen, we recommend that you do not take additional over the counter acetaminophen (Tylenol).  Other pain relieving options:   Using a cold pack to ice the affected area a few times a day (15 to 20 minutes at a time) can help to relieve pain, reduce swelling and bruising.   Elevation of the affected area can also help to reduce pain and swelling.     Post Anesthesia Home Care Instructions  Activity: Get plenty of rest for the remainder of the day. A responsible individual must stay with you for 24 hours following the procedure.  For the next 24 hours, DO NOT: -Drive a car -Paediatric nurse -Drink alcoholic beverages -Take any medication unless instructed by your physician -Make any legal decisions or sign important papers.  Meals: Start with liquid  foods such as gelatin or soup. Progress to regular foods as tolerated. Avoid greasy, spicy, heavy foods. If nausea and/or vomiting occur, drink only clear liquids until the nausea and/or vomiting subsides. Call your physician if vomiting continues.  Special Instructions/Symptoms: Your throat may feel dry or sore from the anesthesia or the breathing  tube placed in your throat during surgery. If this causes discomfort, gargle with warm salt water. The discomfort should disappear within 24 hours.  If you had a scopolamine patch placed behind your ear for the management of post- operative nausea and/or vomiting:  1. The medication in the patch is effective for 72 hours, after which it should be removed.  Wrap patch in a tissue and discard in the trash. Wash hands thoroughly with soap and water. 2. You may remove the patch earlier than 72 hours if you experience unpleasant side effects which may include dry mouth, dizziness or visual disturbances. 3. Avoid touching the patch. Wash your hands with soap and water after contact with the patch.    Regional Anesthesia Blocks  1. Numbness or the inability to move the "blocked" extremity may last from 3-48 hours after placement. The length of time depends on the medication injected and your individual response to the medication. If the numbness is not going away after 48 hours, call your surgeon.  2. The extremity that is blocked will need to be protected until the numbness is gone and the  Strength has returned. Because you cannot feel it, you will need to take extra care to avoid injury. Because it may be weak, you may have difficulty moving it or using it. You may not know what position it is in without looking at it while the block is in effect.  3. For blocks in the legs and feet, returning to weight bearing and walking needs to be done carefully. You will need to wait until the numbness is entirely gone and the strength has returned. You should be able to move your leg and foot normally before you try and bear weight or walk. You will need someone to be with you when you first try to ensure you do not fall and possibly risk injury.  4. Bruising and tenderness at the needle site are common side effects and will resolve in a few days.  5. Persistent numbness or new problems with movement should be  communicated to the surgeon or the [facility] [phone] [place] [phone]).

## 2020-11-13 NOTE — Anesthesia Procedure Notes (Addendum)
Anesthesia Regional Block: Supraclavicular block   Pre-Anesthetic Checklist: , timeout performed,  Correct Patient, Correct Site, Correct Laterality,  Correct Procedure, Correct Position, site marked,  Risks and benefits discussed,  Surgical consent,  Pre-op evaluation,  At surgeon's request and post-op pain management  Laterality: Upper and Left  Prep: chloraprep       Needles:  Injection technique: Single-shot  Needle Type: Echogenic Stimulator Needle     Needle Length: 9cm  Needle Gauge: 20   Needle insertion depth: 3 cm   Additional Needles:   Procedures:,,,, ultrasound used (permanent image in chart),,    Narrative:  Start time: 11/13/2020 11:15 AM End time: 11/13/2020 11:23 AM Injection made incrementally with aspirations every 5 mL.  Performed by: Personally  Anesthesiologist: Lyn Hollingshead, MD

## 2020-11-13 NOTE — Anesthesia Preprocedure Evaluation (Signed)
Anesthesia Evaluation  Patient identified by MRN, date of birth, ID band Patient awake    Reviewed: Allergy & Precautions, H&P , NPO status , Patient's Chart, lab work & pertinent test results  Airway Mallampati: III  TM Distance: >3 FB Neck ROM: Full    Dental no notable dental hx. (+) Teeth Intact, Dental Advisory Given   Pulmonary neg sleep apnea, former smoker,    Pulmonary exam normal        Cardiovascular hypertension, Pt. on medications Normal cardiovascular exam     Neuro/Psych negative neurological ROS  negative psych ROS   GI/Hepatic Neg liver ROS, GERD  Controlled and Medicated,  Endo/Other  negative endocrine ROS  Renal/GU      Musculoskeletal  (+) Arthritis ,   Abdominal (+) + obese,   Peds  Hematology negative hematology ROS (+)   Anesthesia Other Findings   Reproductive/Obstetrics                             Anesthesia Physical  Anesthesia Plan  ASA: II  Anesthesia Plan: MAC and Regional   Post-op Pain Management:  Regional for Post-op pain   Induction:   PONV Risk Score and Plan: 2 and Ondansetron, Dexamethasone and Midazolam  Airway Management Planned: Natural Airway, Simple Face Mask and Nasal Cannula  Additional Equipment: None  Intra-op Plan:   Post-operative Plan:   Informed Consent: I have reviewed the patients History and Physical, chart, labs and discussed the procedure including the risks, benefits and alternatives for the proposed anesthesia with the patient or authorized representative who has indicated his/her understanding and acceptance.     Dental advisory given  Plan Discussed with: CRNA  Anesthesia Plan Comments:         Anesthesia Quick Evaluation

## 2020-11-13 NOTE — Transfer of Care (Signed)
Immediate Anesthesia Transfer of Care Note  Patient: Blake Solis  Procedure(s) Performed: left elbow ulnar nerve neurolysis, ganglion cyst removal (Left: Elbow)  Patient Location: PACU  Anesthesia Type:MAC combined with regional for post-op pain  Level of Consciousness: awake, alert  and oriented  Airway & Oxygen Therapy: Patient Spontanous Breathing and Patient connected to face mask oxygen  Post-op Assessment: Report given to RN and Post -op Vital signs reviewed and stable  Post vital signs: Reviewed and stable  Last Vitals:  Vitals Value Taken Time  BP 140/75 11/13/20 1358  Temp    Pulse 46 11/13/20 1400  Resp 11 11/13/20 1400  SpO2 95 % 11/13/20 1400  Vitals shown include unvalidated device data.  Last Pain:  Vitals:   11/13/20 1022  TempSrc: Oral  PainSc: 0-No pain         Complications: No notable events documented.

## 2020-11-13 NOTE — H&P (Signed)
PREOPERATIVE H&P  Chief Complaint: left cubital tunnel syndrome, ganglion cyst  HPI: Blake Solis is a 65 y.o. male who presents for surgical treatment of left cubital tunnel syndrome, ganglion cyst.  He denies any changes in medical history.  Past Medical History:  Diagnosis Date   Arthritis    Bronchitis, allergic    Essential hypertension    On several medications.   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   POLYPECTOMY  09/06/2019   Procedure: POLYPECTOMY;  Surgeon: Rogene Houston, MD;  Location: [facility];  Service: Endoscopy;;   SHOULDER ARTHROSCOPY WITH ROTATOR CUFF REPAIR AND SUBACROMIAL DECOMPRESSION Right 10/14/2017   Procedure: RIGHT SHOULDER ARTHROSCOPY WITH EXTENSIVE DEBRIDEMENT, SUBACROMIAL DECOMPRESSION, DISTAL CLAVICLE EXCISION AND BICEPS TENODYSIS;  Surgeon: Leandrew Koyanagi, MD;  Location: [facility];  Service: Orthopedics;  Laterality: Right;   TOTAL HIP ARTHROPLASTY Right 06/07/2012   Procedure: TOTAL HIP ARTHROPLASTY ANTERIOR APPROACH;  Surgeon: Marybelle Killings, MD;  Location: [facility];  Service: Orthopedics;  Laterality: Right;  Right Total Hip Arthroplasty-Anterior Approach    TRANSTHORACIC ECHOCARDIOGRAM  04/28/2020   [facility]) EF 65 to 70%.  No or WMA.  GR 1 DD.  Mildly thickened aortic valve.  Mild to moderately dilated left atrium.  Normal RV size and function.  Mild RA dilation.:   Social History   Socioeconomic History   Marital status: Married    Spouse name: Not on file   Number of children: 1   Years of education: Not on file   Highest education level: GED or equivalent  Occupational History   Occupation: Field seismologist: [employer]: Now called [employer]  Tobacco Use   Smoking status: Former    Packs/day: 1.00    Years: 40.00    Pack years: 40.00    Types: Cigarettes    Quit date: 04/22/1998    Years since quitting: 22.5   Smokeless tobacco: Never  Vaping Use   Vaping Use: Never used  Substance and Sexual Activity   Alcohol use: No   Drug use: No   Sexual activity: Not on file  Other Topics Concern   Not on file  Social History Narrative   He is a married father of 1 daughter, grandfather of 1.     His daughter [name] and grandson lives with him and his wife.      He currently works for [employer] as a Merchant navy officer.   Former smoker about 40 pack years, quit in 2000  Does lots of exercise and physical activity and work, but does not do routine exercise otherwise.       Social Determinants of Health   Financial Resource Strain: Not on file  Food Insecurity: Not on file  Transportation Needs: Not on file  Physical Activity: Not on file  Stress: Not on file  Social Connections: Not on file   Family History  Problem Relation Age of Onset   Colon cancer Brother 54   Neurologic Disorder Mother        [name] - by report   Liver disease Father    Other Sister 76       Natural causes   Pancreatic cancer Brother 83       Unknown   Diabetes Mellitus II Brother        Died from DM-Coma @ 20   Diabetic kidney disease Brother 42       End-stage- s/p Txplant ([place] was the  Donor)   Lung cancer Brother 23   Other Sister 44   Healthy Sister        He does not know much details   Arthritis Sister    Diabetes type II Sister    Arthritis Sister    Stomach cancer Sister    No Known Allergies Prior to Admission medications   Medication Sig Start Date End Date Taking? Authorizing Provider  amLODipine (NORVASC) 10 MG tablet Take 10 mg by mouth daily.   Yes [provider]  losartan (COZAAR) 100 MG tablet Take 100 mg by mouth daily.   Yes [provider]     Positive ROS: All other systems have been reviewed and were otherwise negative with the exception of those mentioned in the HPI and as above.  Physical Exam: General: Alert, no acute distress Cardiovascular: No pedal edema Respiratory: No cyanosis, no use of accessory musculature GI: abdomen soft Skin: No lesions in the area of chief complaint Neurologic: Sensation intact distally Psychiatric: Patient is competent for consent with normal mood and affect Lymphatic: no lymphedema  MUSCULOSKELETAL: exam stable  Assessment: left cubital tunnel syndrome, ganglion cyst  Plan: Plan for Procedure(s): left elbow ulnar nerve neurolysis, ganglion cyst removal  The risks benefits and alternatives were discussed with the patient including but not limited to the risks of nonoperative treatment, versus surgical intervention including infection, bleeding, nerve injury,  blood clots, cardiopulmonary complications, morbidity, mortality, among others, and they were willing to proceed.   Preoperative templating of the joint replacement has been completed, documented, and submitted to the Operating Room personnel in order to optimize intra-operative equipment management.   Eduard Roux, MD 11/13/2020 9:48 AM

## 2020-11-13 NOTE — Anesthesia Postprocedure Evaluation (Signed)
Anesthesia Post Note  Patient: Blake Solis  Procedure(s) Performed: left elbow ulnar nerve neurolysis, ganglion cyst removal (Left: Elbow)     Patient location during evaluation: Phase II Anesthesia Type: Regional Level of consciousness: awake Pain management: pain level controlled Vital Signs Assessment: post-procedure vital signs reviewed and stable Respiratory status: spontaneous breathing Cardiovascular status: stable Postop Assessment: no apparent nausea or vomiting Anesthetic complications: no   No notable events documented.  Last Vitals:  Vitals:   11/13/20 1415 11/13/20 1430  BP: 122/78 121/77  Pulse: (!) 47 (!) 49  Resp: 15 15  Temp:    SpO2: 93% 94%    Last Pain:  Vitals:   11/13/20 1430  TempSrc:   PainSc: [name]

## 2020-11-14 ENCOUNTER — Encounter (HOSPITAL_BASED_OUTPATIENT_CLINIC_OR_DEPARTMENT_OTHER): Payer: Self-pay | Admitting: Orthopaedic Surgery

## 2020-11-14 LAB — SURGICAL PATHOLOGY

## 2020-11-17 ENCOUNTER — Telehealth: Payer: Self-pay

## 2020-11-17 NOTE — Telephone Encounter (Signed)
Spoke to patient tonight and answered his questions

## 2020-11-17 NOTE — Telephone Encounter (Signed)
Pt called in stating that his hand and his fingers are swollen and would like a call back.  Pt did have surgery on the 25th. Please advise

## 2020-11-17 NOTE — Telephone Encounter (Signed)
There was a voicemail on the triage line from 11/14/20 @ 304pm, patient had questions about changing his dressing and using the sling and brace. He would like a call back to discuss this

## 2020-11-19 ENCOUNTER — Telehealth: Payer: Self-pay

## 2020-11-19 ENCOUNTER — Other Ambulatory Visit: Payer: Self-pay

## 2020-11-19 ENCOUNTER — Encounter (HOSPITAL_COMMUNITY): Payer: Self-pay | Admitting: Emergency Medicine

## 2020-11-19 ENCOUNTER — Emergency Department (HOSPITAL_COMMUNITY)
Admission: EM | Admit: 2020-11-19 | Discharge: 2020-11-19 | Discharge status: Against Medical Advice | Payer: BC Managed Care – PPO | Attending: Emergency Medicine | Admitting: Emergency Medicine

## 2020-11-19 DIAGNOSIS — N492 Inflammatory disorders of scrotum: Secondary | ICD-10-CM | POA: Diagnosis not present

## 2020-11-19 DIAGNOSIS — Z5321 Procedure and treatment not carried out due to patient leaving prior to being seen by health care provider: Secondary | ICD-10-CM | POA: Diagnosis not present

## 2020-11-19 NOTE — Telephone Encounter (Signed)
Returned patient call. No answer. No way to leave message.

## 2020-11-19 NOTE — ED Triage Notes (Signed)
Pt c/o abscess to left scrotum. Sent by pcp for evaluation. No fever/n/v. Patient reports redness and swelling with some serosanguinous foul smelling drainage.

## 2020-11-19 NOTE — Telephone Encounter (Signed)
Patient went to his PCP with scrotum drainage when it comes to a head and pops with a foul smelling discharge.  Please advise.  Call back:  [phone]  Thanks, Helene Kelp

## 2020-11-20 ENCOUNTER — Ambulatory Visit (INDEPENDENT_AMBULATORY_CARE_PROVIDER_SITE_OTHER): Payer: BC Managed Care – PPO | Admitting: Physician Assistant

## 2020-11-20 ENCOUNTER — Encounter: Payer: BC Managed Care – PPO | Admitting: Orthopaedic Surgery

## 2020-11-20 ENCOUNTER — Encounter: Payer: Self-pay | Admitting: Orthopaedic Surgery

## 2020-11-20 DIAGNOSIS — M67422 Ganglion, left elbow: Secondary | ICD-10-CM

## 2020-11-20 DIAGNOSIS — G5622 Lesion of ulnar nerve, left upper limb: Secondary | ICD-10-CM

## 2020-11-20 NOTE — Progress Notes (Signed)
Post-Op Visit Note   Patient: Blake Solis           Date of Birth: 1956-03-01           MRN: [id number] Visit Date: 11/20/2020 PCP: [facility]   Assessment & Plan:  Chief Complaint:  Chief Complaint  Patient presents with   Left Elbow - Pain, Routine Post Op   Visit Diagnoses:  1. Cubital tunnel syndrome on left   2. Ganglion of left elbow     Plan: Patient is a very pleasant 65 year old gentleman who comes in today status post left elbow ganglion cyst removal and cubital tunnel release with anterior transposition, date of surgery 11/13/2020.  He has been doing well.  He denies any pain.  Examination of his left elbow reveals a fully healed surgical incision with nylon sutures in place.  No evidence of infection or cellulitis.  He is neurovascular intact distally.  Today, sutures were removed and Steri-Strips applied.  He will begin to work on gentle range of motion of the elbow.  No lifting or repetitive motion to the left upper extremity until 6 weeks postop.  Follow-up with [place] in 5 weeks.  Call with concerns or questions.  Follow-Up Instructions: Return in about 5 weeks (around 12/25/2020).   Orders:  No orders of the defined types were placed in this encounter.  No orders of the defined types were placed in this encounter.   Imaging:   PMFS History: Patient Active Problem List   Diagnosis Date Noted   Ganglion of left elbow 10/28/2020   Chest pain with high risk for cardiac etiology 04/29/2020   Renal cyst 01/21/2020   Benign prostatic hyperplasia with urinary obstruction 01/21/2020   Nocturia 01/21/2020   S/P arthroscopy of right shoulder 11/01/2017   Superior glenoid labrum lesion of right shoulder    Arthrosis of right acromioclavicular joint    Tendinopathy of rotator cuff, right    Impingement syndrome of right shoulder    Nontraumatic tear of right supraspinatus tendon 09/29/2017   Cubital tunnel syndrome on left 05/13/2017   Avascular  necrosis of right femoral head ([place]) 06/07/2012    Class: Diagnosis of   Essential hypertension 07/08/2010   Hyperlipidemia due to dietary fat intake 07/08/2010   Prostatitis 07/08/2010   Degenerative disc disease 07/08/2010   Hydrocele of testis 07/08/2010   Obesity (BMI 35.0-39.9 without comorbidity) 09/08/2009   Past Medical History:  Diagnosis Date   Arthritis    Bronchitis, allergic    Essential hypertension    On several medications.   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   Tick bite 01/26/2020    Family History  Problem Relation Age of Onset   Colon cancer Brother 18   Neurologic Disorder Mother        [name] - by report   Liver disease Father    Other Sister 72       Natural causes   Pancreatic cancer Brother 76       Unknown   Diabetes Mellitus II Brother        Died from DM-Coma @ 2   Diabetic kidney disease Brother 37       End-stage- s/p Txplant (Dieter was the Donor)   Lung cancer Brother 45  Other Sister 70   Healthy Sister        He does not know much details   Arthritis Sister    Diabetes type II Sister    Arthritis Sister    Stomach cancer Sister     Past Surgical History:  Procedure Laterality Date   CARDIAC CATHETERIZATION  04/04/2002   Images reviewed: Normal coronaries   COLONOSCOPY N/A 09/06/2019   Procedure: COLONOSCOPY;  Surgeon: Rogene Houston, MD;  [facility];  Service: Endoscopy;  Laterality: N/A;  135   CYST EXCISION Right    cyst removed from arm   ELBOW SURGERY Left    HIP ARTHROPLASTY Right    KIDNEY DONATION Left    Was donor for his brother   KNEE ARTHROSCOPY  2013   lft ulnar nerve removed     decompression   NEPHRECTOMY Left    left, donated to his brother, EF 55-60%...   POLYPECTOMY  09/06/2019   Procedure:  POLYPECTOMY;  Surgeon: Rogene Houston, MD;  [facility];  Service: Endoscopy;;   SHOULDER ARTHROSCOPY WITH ROTATOR CUFF REPAIR AND SUBACROMIAL DECOMPRESSION Right 10/14/2017   Procedure: RIGHT SHOULDER ARTHROSCOPY WITH EXTENSIVE DEBRIDEMENT, SUBACROMIAL DECOMPRESSION, DISTAL CLAVICLE EXCISION AND BICEPS TENODYSIS;  Surgeon: Leandrew Koyanagi, MD;  Location: [place];  Service: Orthopedics;  Laterality: Right;   TOTAL HIP ARTHROPLASTY Right 06/07/2012   Procedure: TOTAL HIP ARTHROPLASTY ANTERIOR APPROACH;  Surgeon: Marybelle Killings, MD;  Location: [place];  Service: Orthopedics;  Laterality: Right;  Right Total Hip Arthroplasty-Anterior Approach   TRANSTHORACIC ECHOCARDIOGRAM  04/28/2020   [facility]) EF 65 to 70%.  No or WMA.  GR 1 DD.  Mildly thickened aortic valve.  Mild to moderately dilated left atrium.  Normal RV size and function.  Mild RA dilation.:   ULNAR NERVE TRANSPOSITION Left 11/13/2020   Procedure: left elbow ulnar nerve neurolysis, ganglion cyst removal;  Surgeon: Leandrew Koyanagi, MD;  Location: [place];  Service: Orthopedics;  Laterality: Left;   Social History   Occupational History   Occupation: Field seismologist: [employer]: Now called [employer]  Tobacco Use   Smoking status: Former    Packs/day: 1.00    Years: 40.00    Pack years: 40.00    Types: Cigarettes    Quit date: 04/22/1998    Years since quitting: 22.5   Smokeless tobacco: Never  Vaping Use   Vaping Use: Never used  Substance and Sexual Activity   Alcohol use: No   Drug use: No   Sexual activity: Not on file

## 2020-11-21 ENCOUNTER — Telehealth: Payer: Self-pay | Admitting: Orthopaedic Surgery

## 2020-11-21 NOTE — Telephone Encounter (Signed)
Pt submitted medical release form, FMLA paper work, and $25.00 cash payment to [employer]. Accepted 11/21/20

## 2020-11-21 NOTE — Telephone Encounter (Signed)
Pt called requesting a call back from [place]. Pt did not state the reasoning for his call. Please call pt at [phone].

## 2020-11-25 ENCOUNTER — Telehealth: Payer: Self-pay | Admitting: Orthopaedic Surgery

## 2020-11-25 NOTE — Telephone Encounter (Signed)
Lyanne Co, RMA Patient states he understood he was to return to work regular duty but, 9/1 ov note states restrictions. Please verify and update dictation if regular duty.     I am completing rtw form. Thanks    I spoke with Ria Comment. Patient cannot return to work with listed restrictions, however, he knows what he is not supposed to do and advised he would get help with those tasks. Per Ria Comment, ok to return to work with no restrictions as patient knows what not to do.

## 2020-11-25 NOTE — Telephone Encounter (Signed)
Pt called to cancel appt. I accidentally already cancelled it and wanted to make you aware. Pt states he will call back when available to reschedule.  CB [phone]

## 2020-11-25 NOTE — Telephone Encounter (Signed)
I called, no answer. Will try again

## 2020-11-25 NOTE — Telephone Encounter (Signed)
I spoke with patient and informed his paperwork is ready to be picked up and no charge. Money enclosed with his form.

## 2020-11-25 NOTE — Telephone Encounter (Signed)
IC patient, advised of below and he understood. Aware and wants form to be marked RTW w/ no accomadations. This was done and ready for him to pick up.

## 2020-11-27 ENCOUNTER — Telehealth: Payer: Self-pay

## 2020-11-27 NOTE — Telephone Encounter (Signed)
Patient called concerning left elbow being swollen.  Talked with Mendel Ryder and was advised to tell patient to wrap his left elbow with an ace wrap for compression, use ice, and elevate left arm.  Advised patient of message above per Mendel Ryder, Dr. Phoebe Sharps PA and advised to give [place] a call back if left elbow gets worse.  Patient voiced that he understands and he is currently not working.

## 2020-11-28 ENCOUNTER — Telehealth: Payer: Self-pay

## 2020-11-28 NOTE — Telephone Encounter (Signed)
Patient called stating that his left elbow is not getting any better.  Stated that when he removes the ace wrap, his left elbow is still swollen and that he has been elevating and using ice.  Cb# [phone].  Please advise.  Thank you.

## 2020-11-29 NOTE — Telephone Encounter (Signed)
Spoke to patient

## 2020-12-01 ENCOUNTER — Telehealth: Payer: Self-pay | Admitting: Orthopaedic Surgery

## 2020-12-01 NOTE — Telephone Encounter (Signed)
Received call from patient checking if we received forms from [facility]. I advised we have not. He is going to have refaxed.

## 2020-12-02 ENCOUNTER — Ambulatory Visit: Payer: BC Managed Care – PPO | Admitting: Orthopaedic Surgery

## 2020-12-03 ENCOUNTER — Encounter: Payer: BC Managed Care – PPO | Admitting: Physical Medicine and Rehabilitation

## 2020-12-18 ENCOUNTER — Telehealth: Payer: Self-pay

## 2020-12-18 NOTE — Telephone Encounter (Signed)
Patient calling to see if he can be seen sooner that Nov.   Scrotum bump has popped and has come back up really bad.  Please advise.  Thanks, Helene Kelp

## 2020-12-18 NOTE — Telephone Encounter (Signed)
Patient calling to see if he can be seen sooner that Nov.  Scrotum bump has come back up really bad.

## 2020-12-23 ENCOUNTER — Ambulatory Visit (INDEPENDENT_AMBULATORY_CARE_PROVIDER_SITE_OTHER): Payer: BC Managed Care – PPO | Admitting: Urology

## 2020-12-23 ENCOUNTER — Other Ambulatory Visit: Payer: Self-pay

## 2020-12-23 VITALS — BP 145/81 | HR 54

## 2020-12-23 DIAGNOSIS — L723 Sebaceous cyst: Secondary | ICD-10-CM

## 2020-12-23 DIAGNOSIS — N281 Cyst of kidney, acquired: Secondary | ICD-10-CM

## 2020-12-23 LAB — URINALYSIS, ROUTINE W REFLEX MICROSCOPIC
Bilirubin, UA: NEGATIVE
Glucose, UA: NEGATIVE
Ketones, UA: NEGATIVE
Leukocytes,UA: NEGATIVE
Nitrite, UA: NEGATIVE
Protein,UA: NEGATIVE
Specific Gravity, UA: 1.02 (ref 1.005–1.030)
Urobilinogen, Ur: 0.2 mg/dL (ref 0.2–1.0)
pH, UA: 6.5 (ref 5.0–7.5)

## 2020-12-23 LAB — MICROSCOPIC EXAMINATION
Bacteria, UA: NONE SEEN
Epithelial Cells (non renal): NONE SEEN /hpf (ref 0–10)
RBC, Urine: NONE SEEN /hpf (ref 0–2)
Renal Epithel, UA: NONE SEEN /hpf
WBC, UA: NONE SEEN /hpf (ref 0–5)

## 2020-12-23 NOTE — Progress Notes (Signed)
Urological Symptom Review  Patient is experiencing the following symptoms: Scrotal abscess   Review of Systems  Gastrointestinal (upper)  : Negative for upper GI symptoms  Gastrointestinal (lower) : Negative for lower GI symptoms  Constitutional : Negative for symptoms  Skin: Negative for skin symptoms  Eyes: Negative for eye symptoms  Ear/Nose/Throat : Negative for Ear/Nose/Throat symptoms  Hematologic/Lymphatic: Negative for Hematologic/Lymphatic symptoms  Cardiovascular : Negative for cardiovascular symptoms  Respiratory : Negative for respiratory symptoms  Endocrine: Negative for endocrine symptoms  Musculoskeletal: Negative for musculoskeletal symptoms  Neurological: Negative for neurological symptoms  Psychologic: Negative for psychiatric symptoms

## 2020-12-23 NOTE — Telephone Encounter (Signed)
Dr. Alyson Ingles will see patient in office today for scrotal drain

## 2020-12-24 ENCOUNTER — Encounter: Payer: Self-pay | Admitting: Urology

## 2020-12-24 DIAGNOSIS — L723 Sebaceous cyst: Secondary | ICD-10-CM

## 2020-12-24 NOTE — Progress Notes (Signed)
12/23/2020 4:13 PM   Blake Solis Jun 03, 1955 [id number]  Referring provider: [facility], [place],  [place] [address]  Left scrotal swelling   HPI: Blake Solis is a 65yo here with worsening left scrotal swelling. 1 week ago he noted his scrotal sebaceous cyst began swelling and became tender. It started spontaneously draining yesterday. He denies any pain currently. He denies any fevers. No other associated symptoms. No exacerbating events   PMH: Past Medical History:  Diagnosis Date   Arthritis    Bronchitis, allergic    Essential hypertension    On several medications.   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   Tick bite 01/26/2020    Surgical History: Past Surgical History:  Procedure Laterality Date   CARDIAC CATHETERIZATION  04/04/2002   Images reviewed: Normal coronaries   COLONOSCOPY N/A 09/06/2019   Procedure: COLONOSCOPY;  Surgeon: Rogene Houston, MD;  [facility];  Service: Endoscopy;  Laterality: N/A;  135   CYST EXCISION Right    cyst removed from arm   ELBOW SURGERY Left    HIP ARTHROPLASTY Right    KIDNEY DONATION Left    Was donor for his brother   KNEE ARTHROSCOPY  2013   lft ulnar nerve removed     decompression   NEPHRECTOMY Left    left, donated to his brother, EF 55-60%...   POLYPECTOMY  09/06/2019   Procedure: POLYPECTOMY;  Surgeon: Rogene Houston, MD;  [facility];  Service: Endoscopy;;   SHOULDER ARTHROSCOPY WITH ROTATOR CUFF REPAIR AND SUBACROMIAL DECOMPRESSION Right 10/14/2017   Procedure: RIGHT SHOULDER ARTHROSCOPY WITH EXTENSIVE DEBRIDEMENT, SUBACROMIAL DECOMPRESSION, DISTAL CLAVICLE EXCISION AND BICEPS TENODYSIS;  Surgeon: Leandrew Koyanagi, MD;  Location: [place];  Service: Orthopedics;   Laterality: Right;   TOTAL HIP ARTHROPLASTY Right 06/07/2012   Procedure: TOTAL HIP ARTHROPLASTY ANTERIOR APPROACH;  Surgeon: Marybelle Killings, MD;  Location: [place];  Service: Orthopedics;  Laterality: Right;  Right Total Hip Arthroplasty-Anterior Approach   TRANSTHORACIC ECHOCARDIOGRAM  04/28/2020   [facility]) EF 65 to 70%.  No or WMA.  GR 1 DD.  Mildly thickened aortic valve.  Mild to moderately dilated left atrium.  Normal RV size and function.  Mild RA dilation.:   ULNAR NERVE TRANSPOSITION Left 11/13/2020   Procedure: left elbow ulnar nerve neurolysis, ganglion cyst removal;  Surgeon: Leandrew Koyanagi, MD;  Location: [place];  Service: Orthopedics;  Laterality: Left;    Home Medications:  Allergies as of 12/23/2020   No Known Allergies      Medication List        Accurate as of December 23, 2020 11:59 PM. If you have any questions, ask your nurse or doctor.          STOP taking these medications    HYDROcodone-acetaminophen 7.5-325 MG tablet Commonly known as: Norco Stopped by: Nicolette Bang, MD       TAKE these medications    amLODipine 10 MG tablet Commonly known as: NORVASC Take 10 mg by mouth daily.   losartan 100 MG tablet Commonly known as: COZAAR Take 100 mg by mouth daily.        Allergies: No Known Allergies  Family History: Family History  Problem Relation  Age of Onset   Colon cancer Brother 32   Neurologic Disorder Mother        [name] - by report   Liver disease Father    Other Sister 59       Natural causes   Pancreatic cancer Brother 76       Unknown   Diabetes Mellitus II Brother        Died from DM-Coma @ 50   Diabetic kidney disease Brother 87       End-stage- s/p Txplant (Nehemyah was the Donor)   Lung cancer Brother 26   Other Sister 60   Healthy Sister        He does not know much details   Arthritis Sister    Diabetes type II Sister    Arthritis Sister    Stomach cancer Sister     Social  History:  reports that he quit smoking about 22 years ago. His smoking use included cigarettes. He has a 40.00 pack-year smoking history. He has never used smokeless tobacco. He reports that he does not drink alcohol and does not use drugs.  ROS: All other review of systems were reviewed and are negative except what is noted above in HPI  Physical Exam: BP (!) 145/81   Pulse (!) 54   Constitutional:  Alert and oriented, No acute distress. HEENT:  AT, moist mucus membranes.  Trachea midline, no masses. Cardiovascular: No clubbing, cyanosis, or edema. Respiratory: Normal respiratory effort, no increased work of breathing. GI: Abdomen is soft, nontender, nondistended, no abdominal masses GU: No CVA tenderness. Circumcised phallus. No masses/lesions on penis, testis, scrotum. 3cm left posterior scrotal wall sebaceous cyst Lymph: No cervical or inguinal lymphadenopathy. Skin: No rashes, bruises or suspicious lesions. Neurologic: Grossly intact, no focal deficits, moving all 4 extremities. Psychiatric: Normal mood and affect.  Laboratory Data: Lab Results  Component Value Date   WBC 6.2 09/02/2016   HGB 15.3 09/02/2016   HCT 45.0 09/02/2016   MCV 88.9 09/02/2016   PLT 136 (L) 09/02/2016    Lab Results  Component Value Date   CREATININE 1.26 (H) 09/02/2016    No results found for: PSA  No results found for: TESTOSTERONE  No results found for: HGBA1C  Urinalysis    Component Value Date/Time   COLORURINE YELLOW 06/02/2012 1326   APPEARANCEUR Clear 12/23/2020 1634   LABSPEC 1.015 06/02/2012 1326   PHURINE 8.0 06/02/2012 1326   GLUCOSEU Negative 12/23/2020 [place] 06/02/2012 1326   BILIRUBINUR Negative 12/23/2020 [place] 06/02/2012 1326   PROTEINUR Negative 12/23/2020 1634   PROTEINUR NEGATIVE 06/02/2012 1326   UROBILINOGEN 0.2 01/21/2020 1508   UROBILINOGEN 1.0 06/02/2012 1326   NITRITE Negative 12/23/2020 1634   NITRITE NEGATIVE  06/02/2012 1326   LEUKOCYTESUR Negative 12/23/2020 1634    Lab Results  Component Value Date   LABMICR See below: 12/23/2020   WBCUA None seen 12/23/2020   LABEPIT None seen 12/23/2020   BACTERIA None seen 12/23/2020    Pertinent Imaging:  No results found for this or any previous visit.  No results found for this or any previous visit.  No results found for this or any previous visit.  No results found for this or any previous visit.  No results found for this or any previous visit.  No results found for this or any previous visit.  No results found for this or any previous visit.  No results found for this or  any previous visit.   Assessment & Plan:    1. Scrotal sebaceous cyst 1. Scrotal sebaceous cyst -We discussed the management including observation versus surgical excision and the patient wishes to proceed with excision. Risks/benefits/alternatives discussed - Urinalysis, Routine w reflex microscopic   No follow-ups on file.  Nicolette Bang, MD  [facility] [place]

## 2020-12-24 NOTE — Patient Instructions (Signed)
Epidermoid Cyst An epidermoid cyst, also known as epidermal cyst, is a sac made of skin tissue. The sac contains a substance called keratin. Keratin is a protein that is normally secreted through the hair follicles. When keratin becomes trapped in the top layer of skin (epidermis), it can form an epidermoid cyst. Epidermoid cysts can be found anywhere on your body. These cysts are usually harmless (benign), and they may not cause symptoms unless they become inflamed or infected. What are the causes? This condition may be caused by: A blocked hair follicle. A hair that curls and re-enters the skin instead of growing straight out of the skin (ingrown hair). A blocked pore. Irritated skin. An injury to the skin. Certain conditions that are passed along from parent to child (inherited). Human papillomavirus (HPV). This happens rarely when cysts occur on the bottom of the feet. Long-term (chronic) sun damage to the skin. What increases the risk? The following factors may make you more likely to develop an epidermoid cyst: Having acne. Being male. Having an injury to the skin. Being past puberty. Having certain rare genetic disorders. What are the signs or symptoms? The only symptom of this condition may be a small, painless lump underneath the skin. When an epidermal cyst ruptures, it may become inflamed. True infection in cysts is rare. Symptoms may include: Redness. Inflammation. Tenderness. Warmth. Keratin draining from the cyst. Keratin is grayish-white, bad-smelling substance. Pus draining from the cyst. How is this diagnosed? This condition is diagnosed with a physical exam. In some cases, you may have a sample of tissue (biopsy) taken from your cyst to be examined under a microscope or tested for bacteria. You may be referred to a health care provider who specializes in skin care (dermatologist). How is this treated? If a cyst becomes inflamed, treatment may include: Opening and  draining the cyst, done by a health care provider. After draining, minor surgery to remove the rest of the cyst may be done. Taking antibiotic medicine. Having injections of medicines (steroids) that help to reduce inflammation. Having surgery to remove the cyst. Surgery may be done if the cyst: Becomes large. Bothers you. Has a chance of turning into cancer. Do not try to open a cyst yourself. Follow these instructions at home: Medicines If you were prescribed an antibiotic medicine, take it it as told by your health care provider. Do not stop using the antibiotic even if you start to feel better. Take over-the-counter and prescription medicines only as told by your health care provider. General instructions Keep the area around your cyst clean and dry. Wear loose, dry clothing. Avoid touching your cyst. Check your cyst every day for signs of infection. Check for: Redness, swelling, or pain. Fluid or blood. Warmth. Pus or a bad smell. Keep all follow-up visits. This is important. How is this prevented? Wear clean, dry, clothing. Avoid wearing tight clothing. Keep your skin clean and dry. Take showers or baths every day. Contact a health care provider if: Your cyst develops symptoms of infection. Your condition is not improving or is getting worse. You develop a cyst that looks different from other cysts you have had. You have a fever. Get help right away if: Redness spreads from the cyst into the surrounding area. Summary An epidermoid cyst is a sac made of skin tissue. These cysts are usually harmless (benign), and they may not cause symptoms unless they become inflamed. If a cyst becomes inflamed, treatment may include surgery to open and drain the cyst,  or to remove it. Treatment may also include medicines by mouth or through an injection. Take over-the-counter and prescription medicines only as told by your health care provider. If you were prescribed an antibiotic medicine,  take it as told by your health care provider. Do not stop using the antibiotic even if you start to feel better. Contact a health care provider if your condition is not improving or is getting worse. Keep all follow-up visits as told by your health care provider. This is important. This information is not intended to replace advice given to you by your health care provider. Make sure you discuss any questions you have with your health care provider. Document Revised: 06/13/2019 Document Reviewed: 06/13/2019 Elsevier Patient Education  [place].

## 2020-12-24 NOTE — H&P (View-Only) (Signed)
12/23/2020 4:13 PM   Blake Solis Mar 20, 1956 [id number]  Referring provider: Practice, [address] [place],  [place] [address]  Left scrotal swelling   HPI: Mr Blake Solis is a 65yo here with worsening left scrotal swelling. 1 week ago he noted his scrotal sebaceous cyst began swelling and became tender. It started spontaneously draining yesterday. He denies any pain currently. He denies any fevers. No other associated symptoms. No exacerbating events   PMH: Past Medical History:  Diagnosis Date   Arthritis    Bronchitis, allergic    Essential hypertension    On several medications.   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   Tick bite 01/26/2020    Surgical History: Past Surgical History:  Procedure Laterality Date   CARDIAC CATHETERIZATION  04/04/2002   Images reviewed: Normal coronaries   COLONOSCOPY N/A 09/06/2019   Procedure: COLONOSCOPY;  Surgeon: Rogene Houston, MD;  [facility];  Service: Endoscopy;  Laterality: N/A;  135   CYST EXCISION Right    cyst removed from arm   ELBOW SURGERY Left    HIP ARTHROPLASTY Right    KIDNEY DONATION Left    Was donor for his brother   KNEE ARTHROSCOPY  2013   lft ulnar nerve removed     decompression   NEPHRECTOMY Left    left, donated to his brother, EF 55-60%...   POLYPECTOMY  09/06/2019   Procedure: POLYPECTOMY;  Surgeon: Rogene Houston, MD;  [facility];  Service: Endoscopy;;   SHOULDER ARTHROSCOPY WITH ROTATOR CUFF REPAIR AND SUBACROMIAL DECOMPRESSION Right 10/14/2017   Procedure: RIGHT SHOULDER ARTHROSCOPY WITH EXTENSIVE DEBRIDEMENT, SUBACROMIAL DECOMPRESSION, DISTAL CLAVICLE EXCISION AND BICEPS TENODYSIS;  Surgeon: Leandrew Koyanagi, MD;  Location: [place];  Service: Orthopedics;   Laterality: Right;   TOTAL HIP ARTHROPLASTY Right 06/07/2012   Procedure: TOTAL HIP ARTHROPLASTY ANTERIOR APPROACH;  Surgeon: Marybelle Killings, MD;  Location: [place];  Service: Orthopedics;  Laterality: Right;  Right Total Hip Arthroplasty-Anterior Approach   TRANSTHORACIC ECHOCARDIOGRAM  04/28/2020   [facility]) EF 65 to 70%.  No or WMA.  GR 1 DD.  Mildly thickened aortic valve.  Mild to moderately dilated left atrium.  Normal RV size and function.  Mild RA dilation.:   ULNAR NERVE TRANSPOSITION Left 11/13/2020   Procedure: left elbow ulnar nerve neurolysis, ganglion cyst removal;  Surgeon: Leandrew Koyanagi, MD;  Location: [place];  Service: Orthopedics;  Laterality: Left;    Home Medications:  Allergies as of 12/23/2020   No Known Allergies      Medication List        Accurate as of December 23, 2020 11:59 PM. If you have any questions, ask your nurse or doctor.          STOP taking these medications    HYDROcodone-acetaminophen 7.5-325 MG tablet Commonly known as: Norco Stopped by: Nicolette Bang, MD       TAKE these medications    amLODipine 10 MG tablet Commonly known as: NORVASC Take 10 mg by mouth daily.   losartan 100 MG tablet Commonly known as: COZAAR Take 100 mg by mouth daily.        Allergies: No Known Allergies  Family History: Family History  Problem Relation  Age of Onset   Colon cancer Brother 60   Neurologic Disorder Mother        Blake Solis - by report   Liver disease Father    Other Sister 66       Natural causes   Pancreatic cancer Brother 69       Unknown   Diabetes Mellitus II Brother        Died from DM-Coma @ 73   Diabetic kidney disease Brother 21       End-stage- s/p Txplant ([facility] was the Donor)   Lung cancer Brother 66   Other Sister 44   Healthy Sister        He does not know much details   Arthritis Sister    Diabetes type II Sister    Arthritis Sister    Stomach cancer Sister     Social  History:  reports that he quit smoking about 22 years ago. His smoking use included cigarettes. He has a 40.00 pack-year smoking history. He has never used smokeless tobacco. He reports that he does not drink alcohol and does not use drugs.  ROS: All other review of systems were reviewed and are negative except what is noted above in HPI  Physical Exam: BP (!) 145/81   Pulse (!) 54   Constitutional:  Alert and oriented, No acute distress. HEENT: [place] AT, moist mucus membranes.  Trachea midline, no masses. Cardiovascular: No clubbing, cyanosis, or edema. Respiratory: Normal respiratory effort, no increased work of breathing. GI: Abdomen is soft, nontender, nondistended, no abdominal masses GU: No CVA tenderness. Circumcised phallus. No masses/lesions on penis, testis, scrotum. 3cm left posterior scrotal wall sebaceous cyst Lymph: No cervical or inguinal lymphadenopathy. Skin: No rashes, bruises or suspicious lesions. Neurologic: Grossly intact, no focal deficits, moving all 4 extremities. Psychiatric: Normal mood and affect.  Laboratory Data: Lab Results  Component Value Date   WBC 6.2 09/02/2016   HGB 15.3 09/02/2016   HCT 45.0 09/02/2016   MCV 88.9 09/02/2016   PLT 136 (L) 09/02/2016    Lab Results  Component Value Date   CREATININE 1.26 (H) 09/02/2016    No results found for: PSA  No results found for: TESTOSTERONE  No results found for: HGBA1C  Urinalysis    Component Value Date/Time   COLORURINE YELLOW 06/02/2012 1326   APPEARANCEUR Clear 12/23/2020 1634   LABSPEC 1.015 06/02/2012 1326   PHURINE 8.0 06/02/2012 1326   GLUCOSEU Negative 12/23/2020 [place] 06/02/2012 1326   BILIRUBINUR Negative 12/23/2020 [place] 06/02/2012 1326   PROTEINUR Negative 12/23/2020 1634   PROTEINUR NEGATIVE 06/02/2012 1326   UROBILINOGEN 0.2 01/21/2020 1508   UROBILINOGEN 1.0 06/02/2012 1326   NITRITE Negative 12/23/2020 1634   NITRITE NEGATIVE  06/02/2012 1326   LEUKOCYTESUR Negative 12/23/2020 1634    Lab Results  Component Value Date   LABMICR See below: 12/23/2020   WBCUA None seen 12/23/2020   LABEPIT None seen 12/23/2020   BACTERIA None seen 12/23/2020    Pertinent Imaging:  No results found for this or any previous visit.  No results found for this or any previous visit.  No results found for this or any previous visit.  No results found for this or any previous visit.  No results found for this or any previous visit.  No results found for this or any previous visit.  No results found for this or any previous visit.  No results found for this or  any previous visit.   Assessment & Plan:    1. Scrotal sebaceous cyst 1. Scrotal sebaceous cyst -We discussed the management including observation versus surgical excision and the patient wishes to proceed with excision. Risks/benefits/alternatives discussed - Urinalysis, Routine w reflex microscopic   No follow-ups on file.  Nicolette Bang, MD  [facility] [place]

## 2020-12-25 NOTE — Patient Instructions (Signed)
Blake Solis  12/25/2020     @PREFPERIOPPHARMACY @   Your procedure is scheduled on 01/01/2021.   Report to [facility] at  1030 A.M.   Call this number if you have problems the morning of surgery:  [phone]   Remember:  Do not eat or drink after midnight.      Take these medicines the morning of surgery with A SIP OF WATER                          amlodipine.    Do not wear jewelry, make-up or nail polish.  Do not wear lotions, powders, or perfumes, or deodorant.  Do not shave 48 hours prior to surgery.  Men may shave face and neck.  Do not bring valuables to the hospital.  [facility] is not responsible for any belongings or valuables.  Contacts, dentures or bridgework may not be worn into surgery.  Leave your suitcase in the car.  After surgery it may be brought to your room.  For patients admitted to the hospital, discharge time will be determined by your treatment team.  Patients discharged the day of surgery will not be allowed to drive home and must have someone with them for 24 hours.    Special instructions:   DO NOT smoke tobacco or vape for 24 hours before your procedure.  Please read over the following fact sheets that you were given. Anesthesia Post-op Instructions and Care and Recovery After Surgery      Epidermoid Cyst Removal, Care After This sheet gives you information about how to care for yourself after your procedure. Your health care provider may also give you more specific instructions. If you have problems or questions, contact your health care provider. What can I expect after the procedure? After the procedure, it is common to have: Soreness in the area where your cyst was removed. Tightness or itchiness from the stitches (sutures) in your skin. Follow these instructions at home: Medicines Take over-the-counter and prescription medicines only as told by your health care provider. If you were prescribed an antibiotic  medicine or ointment, take or apply it as told by your health care provider. Do not stop using the antibiotic even if you start to feel better. Incision care  Follow instructions from your health care provider about how to take care of your incision. Make sure you: Wash your hands with soap and water for at least 20 seconds before you change your bandage (dressing). If soap and water are not available, use hand sanitizer. Change your dressing as told by your health care provider. Leave sutures, skin glue, or adhesive strips in place. These skin closures may need to stay in place for 1-2 weeks or longer. If adhesive strip edges start to loosen and curl up, you may trim the loose edges. Do not remove adhesive strips completely unless your health care provider tells you to do that. Keep the dressing dry until your health care provider says that it can be removed. After your dressing is off, check your incision area every day for signs of infection. Check for: Redness, swelling, or pain. Fluid or blood. Warmth. Pus or a bad smell. General instructions Do not take baths, swim, or use a hot tub until your health care provider approves. Ask your health care provider if you may take showers. You may only be allowed to take sponge baths. Your health care  provider may ask you to avoid contact sports or activities that take a lot of effort. Do not do anything that stretches or puts pressure on your incision. You can return to your normal diet. Keep all follow-up visits. This is important. Contact a health care provider if: You have a fever. You have redness, swelling, or pain in the incision area. You have fluid or blood coming from your incision. You have pus or a bad smell coming from your incision. Your incision feels warm to the touch. Your cyst grows back. Get help right away if: If the incision site suddenly increases in size and you have pain at the incision site. You may be checked for a  collection of blood under the skin from the procedure (hematoma). Summary After the procedure, it is common to have soreness in the area where your cyst was removed. Take or apply over-the-counter and prescription medicines only as told by your health care provider. Follow instructions from your health care provider about how to take care of your incision. This information is not intended to replace advice given to you by your health care provider. Make sure you discuss any questions you have with your health care provider. Document Revised: 06/13/2019 Document Reviewed: 06/13/2019 Elsevier Patient Education  [place] Anesthesia, Adult, Care After This sheet gives you information about how to care for yourself after your procedure. Your health care provider may also give you more specific instructions. If you have problems or questions, contact your health care provider. What can I expect after the procedure? After the procedure, the following side effects are common: Pain or discomfort at the IV site. Nausea. Vomiting. Sore throat. Trouble concentrating. Feeling cold or chills. Feeling weak or tired. Sleepiness and fatigue. Soreness and body aches. These side effects can affect parts of the body that were not involved in surgery. Follow these instructions at home: For the time period you were told by your health care provider:  Rest. Do not participate in activities where you could fall or become injured. Do not drive or use machinery. Do not drink alcohol. Do not take sleeping pills or medicines that cause drowsiness. Do not make important decisions or sign legal documents. Do not take care of children on your own. Eating and drinking Follow any instructions from your health care provider about eating or drinking restrictions. When you feel hungry, start by eating small amounts of foods that are soft and easy to digest (bland), such as toast. Gradually return to  your regular diet. Drink enough fluid to keep your urine pale yellow. If you vomit, rehydrate by drinking water, juice, or clear broth. General instructions If you have sleep apnea, surgery and certain medicines can increase your risk for breathing problems. Follow instructions from your health care provider about wearing your sleep device: Anytime you are sleeping, including during daytime naps. While taking prescription pain medicines, sleeping medicines, or medicines that make you drowsy. Have a responsible adult stay with you for the time you are told. It is important to have someone help care for you until you are awake and alert. Return to your normal activities as told by your health care provider. Ask your health care provider what activities are safe for you. Take over-the-counter and prescription medicines only as told by your health care provider. If you smoke, do not smoke without supervision. Keep all follow-up visits as told by your health care provider. This is important. Contact a health care provider if: You  have nausea or vomiting that does not get better with medicine. You cannot eat or drink without vomiting. You have pain that does not get better with medicine. You are unable to pass urine. You develop a skin rash. You have a fever. You have redness around your IV site that gets worse. Get help right away if: You have difficulty breathing. You have chest pain. You have blood in your urine or stool, or you vomit blood. Summary After the procedure, it is common to have a sore throat or nausea. It is also common to feel tired. Have a responsible adult stay with you for the time you are told. It is important to have someone help care for you until you are awake and alert. When you feel hungry, start by eating small amounts of foods that are soft and easy to digest (bland), such as toast. Gradually return to your regular diet. Drink enough fluid to keep your urine pale  yellow. Return to your normal activities as told by your health care provider. Ask your health care provider what activities are safe for you. This information is not intended to replace advice given to you by your health care provider. Make sure you discuss any questions you have with your health care provider. Document Revised: 11/22/2019 Document Reviewed: 06/21/2019 Elsevier Patient Education  2022 [place]. How to Use Chlorhexidine for Bathing Chlorhexidine gluconate (CHG) is a germ-killing (antiseptic) solution that is used to clean the skin. It can get rid of the bacteria that normally live on the skin and can keep them away for about 24 hours. To clean your skin with CHG, you may be given: A CHG solution to use in the shower or as part of a sponge bath. A prepackaged cloth that contains CHG. Cleaning your skin with CHG may help lower the risk for infection: While you are staying in the intensive care unit of the hospital. If you have a vascular access, such as a central line, to provide short-term or long-term access to your veins. If you have a catheter to drain urine from your bladder. If you are on a ventilator. A ventilator is a machine that helps you breathe by moving air in and out of your lungs. After surgery. What are the risks? Risks of using CHG include: A skin reaction. Hearing loss, if CHG gets in your ears and you have a perforated eardrum. Eye injury, if CHG gets in your eyes and is not rinsed out. The CHG product catching fire. Make sure that you avoid smoking and flames after applying CHG to your skin. Do not use CHG: If you have a chlorhexidine allergy or have previously reacted to chlorhexidine. On babies younger than 57 months of age. How to use CHG solution Use CHG only as told by your health care provider, and follow the instructions on the label. Use the full amount of CHG as directed. Usually, this is one bottle. During a shower Follow these steps when  using CHG solution during a shower (unless your health care provider gives you different instructions): Start the shower. Use your normal soap and shampoo to wash your face and hair. Turn off the shower or move out of the shower stream. Pour the CHG onto a clean washcloth. Do not use any type of brush or rough-edged sponge. Starting at your neck, lather your body down to your toes. Make sure you follow these instructions: If you will be having surgery, pay special attention to the part of your  body where you will be having surgery. Scrub this area for at least 1 minute. Do not use CHG on your head or face. If the solution gets into your ears or eyes, rinse them well with water. Avoid your genital area. Avoid any areas of skin that have broken skin, cuts, or scrapes. Scrub your back and under your arms. Make sure to wash skin folds. Let the lather sit on your skin for 1-2 minutes or as long as told by your health care provider. Thoroughly rinse your entire body in the shower. Make sure that all body creases and crevices are rinsed well. Dry off with a clean towel. Do not put any substances on your body afterward--such as powder, lotion, or perfume--unless you are told to do so by your health care provider. Only use lotions that are recommended by the manufacturer. Put on clean clothes or pajamas. If it is the night before your surgery, sleep in clean sheets.  During a sponge bath Follow these steps when using CHG solution during a sponge bath (unless your health care provider gives you different instructions): Use your normal soap and shampoo to wash your face and hair. Pour the CHG onto a clean washcloth. Starting at your neck, lather your body down to your toes. Make sure you follow these instructions: If you will be having surgery, pay special attention to the part of your body where you will be having surgery. Scrub this area for at least 1 minute. Do not use CHG on your head or face. If  the solution gets into your ears or eyes, rinse them well with water. Avoid your genital area. Avoid any areas of skin that have broken skin, cuts, or scrapes. Scrub your back and under your arms. Make sure to wash skin folds. Let the lather sit on your skin for 1-2 minutes or as long as told by your health care provider. Using a different clean, wet washcloth, thoroughly rinse your entire body. Make sure that all body creases and crevices are rinsed well. Dry off with a clean towel. Do not put any substances on your body afterward--such as powder, lotion, or perfume--unless you are told to do so by your health care provider. Only use lotions that are recommended by the manufacturer. Put on clean clothes or pajamas. If it is the night before your surgery, sleep in clean sheets. How to use CHG prepackaged cloths Only use CHG cloths as told by your health care provider, and follow the instructions on the label. Use the CHG cloth on clean, dry skin. Do not use the CHG cloth on your head or face unless your health care provider tells you to. When washing with the CHG cloth: Avoid your genital area. Avoid any areas of skin that have broken skin, cuts, or scrapes. Before surgery Follow these steps when using a CHG cloth to clean before surgery (unless your health care provider gives you different instructions): Using the CHG cloth, vigorously scrub the part of your body where you will be having surgery. Scrub using a back-and-forth motion for 3 minutes. The area on your body should be completely wet with CHG when you are done scrubbing. Do not rinse. Discard the cloth and let the area air-dry. Do not put any substances on the area afterward, such as powder, lotion, or perfume. Put on clean clothes or pajamas. If it is the night before your surgery, sleep in clean sheets.  For general bathing Follow these steps when using CHG cloths for  general bathing (unless your health care provider gives you  different instructions). Use a separate CHG cloth for each area of your body. Make sure you wash between any folds of skin and between your fingers and toes. Wash your body in the following order, switching to a new cloth after each step: The front of your neck, shoulders, and chest. Both of your arms, under your arms, and your hands. Your stomach and groin area, avoiding the genitals. Your right leg and foot. Your left leg and foot. The back of your neck, your back, and your buttocks. Do not rinse. Discard the cloth and let the area air-dry. Do not put any substances on your body afterward--such as powder, lotion, or perfume--unless you are told to do so by your health care provider. Only use lotions that are recommended by the manufacturer. Put on clean clothes or pajamas. Contact a health care provider if: Your skin gets irritated after scrubbing. You have questions about using your solution or cloth. You swallow any chlorhexidine. Call your local poison control center (1-605 729 3362 in the [place] Get help right away if: Your eyes itch badly, or they become very red or swollen. Your skin itches badly and is red or swollen. Your hearing changes. You have trouble seeing. You have swelling or tingling in your mouth or throat. You have trouble breathing. These symptoms may represent a serious problem that is an emergency. Do not wait to see if the symptoms will go away. Get medical help right away. Call your local emergency services (911 in the [place] Do not drive yourself to the hospital. Summary Chlorhexidine gluconate (CHG) is a germ-killing (antiseptic) solution that is used to clean the skin. Cleaning your skin with CHG may help to lower your risk for infection. You may be given CHG to use for bathing. It may be in a bottle or in a prepackaged cloth to use on your skin. Carefully follow your health care provider's instructions and the instructions on the product label. Do not use CHG if  you have a chlorhexidine allergy. Contact your health care provider if your skin gets irritated after scrubbing. This information is not intended to replace advice given to you by your health care provider. Make sure you discuss any questions you have with your health care provider. Document Revised: 05/19/2020 Document Reviewed: 05/19/2020 Elsevier Patient Education  2022 [employer].

## 2020-12-29 ENCOUNTER — Encounter (HOSPITAL_COMMUNITY): Payer: Self-pay

## 2020-12-29 ENCOUNTER — Other Ambulatory Visit: Payer: Self-pay

## 2020-12-29 ENCOUNTER — Encounter (HOSPITAL_COMMUNITY)
Admission: RE | Admit: 2020-12-29 | Discharge: 2020-12-29 | Disposition: A | Discharge status: Home / Self Care | Payer: BC Managed Care – PPO | Source: Ambulatory Visit | Attending: Urology | Admitting: Urology

## 2021-01-01 ENCOUNTER — Ambulatory Visit (HOSPITAL_COMMUNITY): Payer: BC Managed Care – PPO | Admitting: Anesthesiology

## 2021-01-01 ENCOUNTER — Encounter (HOSPITAL_COMMUNITY)
Admission: RE | Disposition: A | Discharge status: Home / Self Care | Payer: Self-pay | Source: Home / Self Care | Attending: Urology

## 2021-01-01 ENCOUNTER — Encounter (HOSPITAL_COMMUNITY): Payer: Self-pay | Admitting: Urology

## 2021-01-01 ENCOUNTER — Ambulatory Visit (HOSPITAL_COMMUNITY)
Admission: RE | Admit: 2021-01-01 | Discharge: 2021-01-01 | Disposition: A | Discharge status: Home / Self Care | Payer: BC Managed Care – PPO | Attending: Urology | Admitting: Urology

## 2021-01-01 ENCOUNTER — Other Ambulatory Visit: Payer: Self-pay

## 2021-01-01 DIAGNOSIS — Z87891 Personal history of nicotine dependence: Secondary | ICD-10-CM | POA: Insufficient documentation

## 2021-01-01 DIAGNOSIS — Z801 Family history of malignant neoplasm of trachea, bronchus and lung: Secondary | ICD-10-CM | POA: Diagnosis not present

## 2021-01-01 DIAGNOSIS — Z8379 Family history of other diseases of the digestive system: Secondary | ICD-10-CM | POA: Diagnosis not present

## 2021-01-01 DIAGNOSIS — Z79899 Other long term (current) drug therapy: Secondary | ICD-10-CM | POA: Diagnosis not present

## 2021-01-01 DIAGNOSIS — Z833 Family history of diabetes mellitus: Secondary | ICD-10-CM | POA: Insufficient documentation

## 2021-01-01 DIAGNOSIS — Z800 Family history of malignant neoplasm of digestive organs: Secondary | ICD-10-CM | POA: Insufficient documentation

## 2021-01-01 DIAGNOSIS — L723 Sebaceous cyst: Secondary | ICD-10-CM | POA: Diagnosis not present

## 2021-01-01 DIAGNOSIS — Z8261 Family history of arthritis: Secondary | ICD-10-CM | POA: Insufficient documentation

## 2021-01-01 HISTORY — PX: SCROTAL EXPLORATION: SHX2386

## 2021-01-01 SURGERY — EXPLORATION, SCROTUM
Anesthesia: General | Site: Scrotum

## 2021-01-01 MED ORDER — LIDOCAINE HCL (PF) 2 % IJ SOLN
INTRAMUSCULAR | Status: AC
  Filled 2021-01-01: qty 5

## 2021-01-01 MED ORDER — ORAL CARE MOUTH RINSE: 15.0000 mL | Freq: Once | OROMUCOSAL | Status: AC

## 2021-01-01 MED ORDER — SULFAMETHOXAZOLE-TRIMETHOPRIM 800-160 MG PO TABS: 1.0000 | ORAL_TABLET | Freq: Two times a day (BID) | ORAL | 0 refills | Status: DC

## 2021-01-01 MED ORDER — EPHEDRINE 5 MG/ML INJ
INTRAVENOUS | Status: AC
  Filled 2021-01-01: qty 5

## 2021-01-01 MED ORDER — PROPOFOL 10 MG/ML IV BOLUS
INTRAVENOUS | Status: DC | PRN
  Administered 2021-01-01: 200 mg via INTRAVENOUS

## 2021-01-01 MED ORDER — CHLORHEXIDINE GLUCONATE 0.12 % MT SOLN
15.0000 mL | Freq: Once | OROMUCOSAL | Status: AC
  Administered 2021-01-01: 15 mL via OROMUCOSAL
  Filled 2021-01-01: qty 15

## 2021-01-01 MED ORDER — MIDAZOLAM HCL 2 MG/2ML IJ SOLN
INTRAMUSCULAR | Status: AC
  Filled 2021-01-01: qty 2

## 2021-01-01 MED ORDER — ONDANSETRON HCL 4 MG/2ML IJ SOLN: 4.0000 mg | Freq: Once | INTRAMUSCULAR | Status: DC | PRN

## 2021-01-01 MED ORDER — LIDOCAINE HCL (CARDIAC) PF 100 MG/5ML IV SOSY
PREFILLED_SYRINGE | INTRAVENOUS | Status: DC | PRN
  Administered 2021-01-01: 60 mg via INTRAVENOUS

## 2021-01-01 MED ORDER — BACITRACIN ZINC 500 UNIT/GM EX OINT
TOPICAL_OINTMENT | CUTANEOUS | Status: DC | PRN
  Administered 2021-01-01: 1 via TOPICAL

## 2021-01-01 MED ORDER — BACITRACIN ZINC 500 UNIT/GM EX OINT
TOPICAL_OINTMENT | CUTANEOUS | Status: AC
  Filled 2021-01-01: qty 0.9

## 2021-01-01 MED ORDER — BACITRACIN ZINC 500 UNIT/GM EX OINT: TOPICAL_OINTMENT | CUTANEOUS | 0 refills | Status: DC

## 2021-01-01 MED ORDER — FENTANYL CITRATE (PF) 100 MCG/2ML IJ SOLN
INTRAMUSCULAR | Status: AC
  Filled 2021-01-01: qty 2

## 2021-01-01 MED ORDER — 0.9 % SODIUM CHLORIDE (POUR BTL) OPTIME
TOPICAL | Status: DC | PRN
  Administered 2021-01-01: 1000 mL

## 2021-01-01 MED ORDER — ONDANSETRON HCL 4 MG/2ML IJ SOLN
INTRAMUSCULAR | Status: AC
  Filled 2021-01-01: qty 2

## 2021-01-01 MED ORDER — MEPERIDINE HCL 50 MG/ML IJ SOLN: 6.2500 mg | INTRAMUSCULAR | Status: DC | PRN

## 2021-01-01 MED ORDER — EPHEDRINE SULFATE 50 MG/ML IJ SOLN
INTRAMUSCULAR | Status: DC | PRN
  Administered 2021-01-01: 10 mg via INTRAVENOUS
  Administered 2021-01-01: 5 mg via INTRAVENOUS
  Administered 2021-01-01: 10 mg via INTRAVENOUS

## 2021-01-01 MED ORDER — LACTATED RINGERS IV SOLN: INTRAVENOUS | Status: DC

## 2021-01-01 MED ORDER — ONDANSETRON HCL 4 MG/2ML IJ SOLN
INTRAMUSCULAR | Status: DC | PRN
  Administered 2021-01-01: 4 mg via INTRAVENOUS

## 2021-01-01 MED ORDER — DEXAMETHASONE SODIUM PHOSPHATE 10 MG/ML IJ SOLN
INTRAMUSCULAR | Status: AC
  Filled 2021-01-01: qty 1

## 2021-01-01 MED ORDER — DEXAMETHASONE SODIUM PHOSPHATE 10 MG/ML IJ SOLN
INTRAMUSCULAR | Status: DC | PRN
  Administered 2021-01-01: 10 mg via INTRAVENOUS

## 2021-01-01 MED ORDER — BUPIVACAINE HCL 0.25 % IJ SOLN
INTRAMUSCULAR | Status: DC | PRN
  Administered 2021-01-01: 10 mL

## 2021-01-01 MED ORDER — FENTANYL CITRATE PF 50 MCG/ML IJ SOSY
25.0000 ug | PREFILLED_SYRINGE | INTRAMUSCULAR | Status: DC | PRN
  Administered 2021-01-01 (×2): 25 ug via INTRAVENOUS
  Filled 2021-01-01: qty 1

## 2021-01-01 MED ORDER — CEFAZOLIN SODIUM-DEXTROSE 2-4 GM/100ML-% IV SOLN
2.0000 g | INTRAVENOUS | Status: AC
  Administered 2021-01-01: 2 g via INTRAVENOUS
  Filled 2021-01-01: qty 100

## 2021-01-01 MED ORDER — FENTANYL CITRATE (PF) 250 MCG/5ML IJ SOLN
INTRAMUSCULAR | Status: DC | PRN
  Administered 2021-01-01: 25 ug via INTRAVENOUS
  Administered 2021-01-01: 50 ug via INTRAVENOUS
  Administered 2021-01-01: 25 ug via INTRAVENOUS

## 2021-01-01 MED ORDER — BUPIVACAINE HCL (PF) 0.25 % IJ SOLN
INTRAMUSCULAR | Status: AC
  Filled 2021-01-01: qty 30

## 2021-01-01 MED ORDER — OXYCODONE-ACETAMINOPHEN 5-325 MG PO TABS: 1.0000 | ORAL_TABLET | ORAL | 0 refills | Status: DC | PRN

## 2021-01-01 MED ORDER — PROPOFOL 10 MG/ML IV BOLUS
INTRAVENOUS | Status: AC
  Filled 2021-01-01: qty 20

## 2021-01-01 SURGICAL SUPPLY — 24 items
ADH SKN CLS APL DERMABOND .7 (GAUZE/BANDAGES/DRESSINGS) ×1
BNDG GAUZE ELAST 4 BULKY (GAUZE/BANDAGES/DRESSINGS) ×1 IMPLANT
COVER SURGICAL LIGHT HANDLE (MISCELLANEOUS) ×4 IMPLANT
DERMABOND ADVANCED (GAUZE/BANDAGES/DRESSINGS) ×1
DERMABOND ADVANCED .7 DNX12 (GAUZE/BANDAGES/DRESSINGS) IMPLANT
ELECT REM PT RETURN 9FT ADLT (ELECTROSURGICAL) ×2
ELECTRODE REM PT RTRN 9FT ADLT (ELECTROSURGICAL) ×1 IMPLANT
GLOVE SRG 8 PF TXTR STRL LF DI (GLOVE) ×1 IMPLANT
GLOVE SURG POLYISO LF SZ8 (GLOVE) ×2 IMPLANT
GLOVE SURG UNDER POLY LF SZ7 (GLOVE) ×4 IMPLANT
GLOVE SURG UNDER POLY LF SZ8 (GLOVE) ×2
GOWN STRL REUS W/TWL LRG LVL3 (GOWN DISPOSABLE) ×2 IMPLANT
GOWN STRL REUS W/TWL XL LVL3 (GOWN DISPOSABLE) ×2 IMPLANT
KIT TURNOVER KIT A (KITS) ×2 IMPLANT
MANIFOLD NEPTUNE II (INSTRUMENTS) ×2 IMPLANT
NDL HYPO 21X1.5 SAFETY (NEEDLE) IMPLANT
NEEDLE HYPO 21X1.5 SAFETY (NEEDLE) ×2 IMPLANT
PACK MINOR (CUSTOM PROCEDURE TRAY) ×2 IMPLANT
PAD ARMBOARD 7.5X6 YLW CONV (MISCELLANEOUS) ×2 IMPLANT
SOL PREP PROV IODINE SCRUB 4OZ (MISCELLANEOUS) ×2 IMPLANT
SUPPORT SCROTAL LG STRP (MISCELLANEOUS) ×2 IMPLANT
SUT VIC AB 3-0 SH 27 (SUTURE) ×2
SUT VIC AB 3-0 SH 27X BRD (SUTURE) ×1 IMPLANT
SYR CONTROL 10ML LL (SYRINGE) ×2 IMPLANT

## 2021-01-01 NOTE — Transfer of Care (Signed)
Immediate Anesthesia Transfer of Care Note  Patient: Blake Solis  Procedure(s) Performed: SCROTUM EXPLORATION- excision of scrotal sebacous cyst (Scrotum)  Patient Location: PACU  Anesthesia Type:General  Level of Consciousness: drowsy  Airway & Oxygen Therapy: Patient Spontanous Breathing and Patient connected to nasal cannula oxygen  Post-op Assessment: Report given to RN and Post -op Vital signs reviewed and stable  Post vital signs: Reviewed and stable  Last Vitals:  Vitals Value Taken Time  BP 123/62   Temp    Pulse 61   Resp    SpO2 96%     Last Pain:  Vitals:   01/01/21 0847  TempSrc: Oral  PainSc: 3          Complications: No notable events documented.

## 2021-01-01 NOTE — Anesthesia Postprocedure Evaluation (Signed)
Anesthesia Post Note  Patient: Blake Solis  Procedure(s) Performed: SCROTUM EXPLORATION- excision of scrotal sebacous cyst (Scrotum)  Patient location during evaluation: PACU Anesthesia Type: General Level of consciousness: awake and alert and oriented Pain management: pain level controlled Vital Signs Assessment: post-procedure vital signs reviewed and stable Respiratory status: spontaneous breathing and respiratory function stable Cardiovascular status: blood pressure returned to baseline and stable Postop Assessment: no apparent nausea or vomiting Anesthetic complications: no   No notable events documented.   Last Vitals:  Vitals:   01/01/21 1030 01/01/21 1045  BP: 114/62 114/62  Pulse: (!) 55 (!) 55  Resp: 14 12  Temp:    SpO2: 98% 98%    Last Pain:  Vitals:   01/01/21 1043  TempSrc:   PainSc: 3                  Wenonah Milo C [place]

## 2021-01-01 NOTE — Op Note (Signed)
Preoperative diagnosis: scrotal sebaceous cyst   Postoperative diagnosis: Same   Procedure: 1. Excision of scrotal sebaceous cyst   Attending: Nicolette Bang, MD   Anesthesia: General   History of blood loss: Minimal   Antibiotics: ancef   Drains: none   Specimens: 1. Scrotal sebaceous cyst     Findings: 3cm x 2cm scrotal sebaceous cyst on posterior left lateral scrotal wall   Indications: Patient is a 65 year old male with a history of a scrotal sebaceous cyst that was growing in size and causing him pain with ambulation.  We discussed the treatment options including observation versus excision after discussing treatment options he proceed with excision.    Procedure in detail: Prior to procedure consent was obtained.  Patient was brought to the operating room and a brief timeout was done to ensure correct patient, correct procedure, correct site.  General anesthesia was administered and patient was placed in supine position.  His genitalia was then prepped and draped in usual sterile fashion.  A 4 cm elliptical incision was made around the sebaceous cyst.  We dissected under the sebaceous cyst and removed the surrounding scrotal skin. We then obtained hemostasis with electrocautery. . The skin was then closed with 3-0 vicryl in a running fashion. A dressing was then applied to the incision.  We then placed a scrotal fluff and this then concluded the procedure which was well tolerated by the patient.   Complications: None   Condition: Stable, extubated, transferred to PACU.   Plan: Patient is to be discharged home.  He is to follow up in 2 weeks for wound check.

## 2021-01-01 NOTE — Anesthesia Preprocedure Evaluation (Signed)
Anesthesia Evaluation  Patient identified by MRN, date of birth, ID band Patient awake    Reviewed: Allergy & Precautions, NPO status , Patient's Chart, lab work & pertinent test results  History of Anesthesia Complications Negative for: history of anesthetic complications  Airway Mallampati: II  TM Distance: >3 FB Neck ROM: Full    Dental  (+) Dental Advisory Given, Edentulous Upper   Pulmonary former smoker,    Pulmonary exam normal breath sounds clear to auscultation       Cardiovascular Exercise Tolerance: Good hypertension, Pt. on medications Normal cardiovascular exam Rhythm:Regular Rate:Normal  Sinus bradycardia   Neuro/Psych  Neuromuscular disease negative psych ROS   GI/Hepatic negative GI ROS, Neg liver ROS,   Endo/Other  negative endocrine ROS  Renal/GU Renal InsufficiencyRenal disease (kidney donation, creatinine - 1.47)     Musculoskeletal  (+) Arthritis , Osteoarthritis,    Abdominal   Peds  Hematology negative hematology ROS (+)   Anesthesia Other Findings   Reproductive/Obstetrics                            Anesthesia Physical Anesthesia Plan  ASA: 2  Anesthesia Plan: General   Post-op Pain Management:    Induction: Intravenous  PONV Risk Score and Plan: 4 or greater and Ondansetron and Dexamethasone  Airway Management Planned: LMA  Additional Equipment:   Intra-op Plan:   Post-operative Plan: Extubation in OR  Informed Consent: I have reviewed the patients History and Physical, chart, labs and discussed the procedure including the risks, benefits and alternatives for the proposed anesthesia with the patient or authorized representative who has indicated his/her understanding and acceptance.     Dental advisory given  Plan Discussed with: CRNA and Surgeon  Anesthesia Plan Comments:        Anesthesia Quick Evaluation

## 2021-01-01 NOTE — Interval H&P Note (Signed)
History and Physical Interval Note:  01/01/2021 9:23 AM  Blake Solis  has presented today for surgery, with the diagnosis of scrotal sebaceous cyst.  The various methods of treatment have been discussed with the patient and family. After consideration of risks, benefits and other options for treatment, the patient has consented to  Procedure(s): SCROTUM EXPLORATION- excision of scrotal sebacous cyst (N/A) as a surgical intervention.  The patient's history has been reviewed, patient examined, no change in status, stable for surgery.  I have reviewed the patient's chart and labs.  Questions were answered to the patient's satisfaction.     Nicolette Bang

## 2021-01-01 NOTE — Anesthesia Procedure Notes (Signed)
Procedure Name: LMA Insertion Date/Time: 01/01/2021 9:35 AM Performed by: Karna Dupes, CRNA Pre-anesthesia Checklist: Patient identified, Emergency Drugs available, Patient being monitored and Suction available Patient Re-evaluated:Patient Re-evaluated prior to induction Oxygen Delivery Method: Circle system utilized Preoxygenation: Pre-oxygenation with 100% oxygen Induction Type: IV induction LMA: LMA inserted LMA Size: 4.0 Placement Confirmation: breath sounds checked- equal and bilateral and positive ETCO2 Tube secured with: Tape Dental Injury: Teeth and Oropharynx as per pre-operative assessment

## 2021-01-02 ENCOUNTER — Encounter (HOSPITAL_COMMUNITY): Payer: Self-pay | Admitting: Urology

## 2021-01-02 ENCOUNTER — Telehealth: Payer: Self-pay

## 2021-01-02 NOTE — Telephone Encounter (Signed)
Patient called stating he was bleeding from his surgical site. Patient states that the 3rd stitch down appears to be open. Per Dr. Alyson Ingles patient is to apply bacitracin ointment with gauze and the bleeding should stop in a couple of hours. Patient in formed to call office back it bleeding worsens. Patient voiced understanding.

## 2021-01-04 LAB — SURGICAL PATHOLOGY

## 2021-01-09 ENCOUNTER — Telehealth: Payer: Self-pay

## 2021-01-09 NOTE — Telephone Encounter (Signed)
Patient called stating he was bleeding pink color  from his surgical site, pt stated that he wasn't running a fever. Per Dr. Alyson Ingles patient is to apply bacitracin ointment with gauze and the bleeding should stop in a couple of hours. Pt was advised

## 2021-01-16 ENCOUNTER — Ambulatory Visit (INDEPENDENT_AMBULATORY_CARE_PROVIDER_SITE_OTHER): Payer: BC Managed Care – PPO | Admitting: Urology

## 2021-01-16 ENCOUNTER — Encounter: Payer: Self-pay | Admitting: Urology

## 2021-01-16 ENCOUNTER — Other Ambulatory Visit: Payer: Self-pay

## 2021-01-16 VITALS — BP 152/89 | HR 50 | Temp 97.6°F | Wt 240.0 lb

## 2021-01-16 DIAGNOSIS — N401 Enlarged prostate with lower urinary tract symptoms: Secondary | ICD-10-CM

## 2021-01-16 DIAGNOSIS — N281 Cyst of kidney, acquired: Secondary | ICD-10-CM

## 2021-01-16 DIAGNOSIS — N138 Other obstructive and reflux uropathy: Secondary | ICD-10-CM

## 2021-01-16 DIAGNOSIS — R351 Nocturia: Secondary | ICD-10-CM

## 2021-01-16 LAB — URINALYSIS, ROUTINE W REFLEX MICROSCOPIC
Bilirubin, UA: NEGATIVE
Glucose, UA: NEGATIVE
Ketones, UA: NEGATIVE
Leukocytes,UA: NEGATIVE
Nitrite, UA: NEGATIVE
Protein,UA: NEGATIVE
RBC, UA: NEGATIVE
Specific Gravity, UA: 1.01 (ref 1.005–1.030)
Urobilinogen, Ur: 0.2 mg/dL (ref 0.2–1.0)
pH, UA: 5.5 (ref 5.0–7.5)

## 2021-01-16 NOTE — Patient Instructions (Signed)
Epidermoid Cyst Removal, Care After This sheet gives you information about how to care for yourself after your procedure. Your health care provider may also give you more specific instructions. If you have problems or questions, contact your health care provider. What can I expect after the procedure? After the procedure, it is common to have: Soreness in the area where your cyst was removed. Tightness or itchiness from the stitches (sutures) in your skin. Follow these instructions at home: Medicines Take over-the-counter and prescription medicines only as told by your health care provider. If you were prescribed an antibiotic medicine or ointment, take or apply it as told by your health care provider. Do not stop using the antibiotic even if you start to feel better. Incision care  Follow instructions from your health care provider about how to take care of your incision. Make sure you: Wash your hands with soap and water for at least 20 seconds before you change your bandage (dressing). If soap and water are not available, use hand sanitizer. Change your dressing as told by your health care provider. Leave sutures, skin glue, or adhesive strips in place. These skin closures may need to stay in place for 1-2 weeks or longer. If adhesive strip edges start to loosen and curl up, you may trim the loose edges. Do not remove adhesive strips completely unless your health care provider tells you to do that. Keep the dressing dry until your health care provider says that it can be removed. After your dressing is off, check your incision area every day for signs of infection. Check for: Redness, swelling, or pain. Fluid or blood. Warmth. Pus or a bad smell. General instructions Do not take baths, swim, or use a hot tub until your health care provider approves. Ask your health care provider if you may take showers. You may only be allowed to take sponge baths. Your health care provider may ask you to  avoid contact sports or activities that take a lot of effort. Do not do anything that stretches or puts pressure on your incision. You can return to your normal diet. Keep all follow-up visits. This is important. Contact a health care provider if: You have a fever. You have redness, swelling, or pain in the incision area. You have fluid or blood coming from your incision. You have pus or a bad smell coming from your incision. Your incision feels warm to the touch. Your cyst grows back. Get help right away if: If the incision site suddenly increases in size and you have pain at the incision site. You may be checked for a collection of blood under the skin from the procedure (hematoma). Summary After the procedure, it is common to have soreness in the area where your cyst was removed. Take or apply over-the-counter and prescription medicines only as told by your health care provider. Follow instructions from your health care provider about how to take care of your incision. This information is not intended to replace advice given to you by your health care provider. Make sure you discuss any questions you have with your health care provider. Document Revised: 06/13/2019 Document Reviewed: 06/13/2019 Elsevier Patient Education  [place].

## 2021-01-16 NOTE — Progress Notes (Signed)
Urological Symptom Review  Patient is experiencing the following symptoms: Frequent urination Get up at night to urinate   Review of Systems  Gastrointestinal (upper)  : Negative for upper GI symptoms  Gastrointestinal (lower) : Negative for lower GI symptoms  Constitutional : Fatigue  Skin: Negative for skin symptoms  Eyes: Negative for eye symptoms  Ear/Nose/Throat : Negative for Ear/Nose/Throat symptoms  Hematologic/Lymphatic: Negative for Hematologic/Lymphatic symptoms  Cardiovascular : Negative for cardiovascular symptoms  Respiratory : Negative for respiratory symptoms  Endocrine: Negative for endocrine symptoms  Musculoskeletal: Negative for musculoskeletal symptoms  Neurological: Negative for neurological symptoms  Psychologic: Negative for psychiatric symptoms

## 2021-01-21 ENCOUNTER — Ambulatory Visit: Payer: BC Managed Care – PPO | Admitting: Urology

## 2021-01-21 ENCOUNTER — Other Ambulatory Visit: Payer: Self-pay

## 2021-01-21 ENCOUNTER — Ambulatory Visit (INDEPENDENT_AMBULATORY_CARE_PROVIDER_SITE_OTHER): Payer: BC Managed Care – PPO | Admitting: Urology

## 2021-01-21 VITALS — BP 148/72 | HR 58

## 2021-01-21 DIAGNOSIS — L723 Sebaceous cyst: Secondary | ICD-10-CM

## 2021-01-21 MED ORDER — DOXYCYCLINE HYCLATE 100 MG PO CAPS: 100.0000 mg | ORAL_CAPSULE | Freq: Two times a day (BID) | ORAL | 0 refills | Status: DC

## 2021-01-21 NOTE — Progress Notes (Signed)
Urological Symptom Review  Patient is experiencing the following symptoms: Frequent urination Get up at night to urinate   Review of Systems  Gastrointestinal (upper)  : Negative for upper GI symptoms  Gastrointestinal (lower) : Negative for lower GI symptoms  Constitutional : Fatigue  Skin: Negative for skin symptoms  Eyes: Negative for eye symptoms  Ear/Nose/Throat : Negative for Ear/Nose/Throat symptoms  Hematologic/Lymphatic: Negative for Hematologic/Lymphatic symptoms  Cardiovascular : Negative for cardiovascular symptoms  Respiratory : Negative for respiratory symptoms  Endocrine: Negative for endocrine symptoms  Musculoskeletal: Joint pain  Neurological: Negative for neurological symptoms  Psychologic: Negative for psychiatric symptoms

## 2021-01-26 ENCOUNTER — Encounter: Payer: Self-pay | Admitting: Urology

## 2021-01-26 NOTE — Progress Notes (Signed)
01/16/2021 10:36 AM   Blake Solis 1955-04-17 [id number]  Referring provider: Practice, [facility] [place],  [place] [address]  Followup sebaceous cyst   HPI: Blake Solis is a 65yo here for followup after excision of a scrotal sebaceous cyst. He denies any scrotal pain. No swelling or drainage from the incision. No other complaints today   PMH: Past Medical History:  Diagnosis Date   Arthritis    Bronchitis, allergic    Essential hypertension    On several medications.   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   Tick bite 01/26/2020    Surgical History: Past Surgical History:  Procedure Laterality Date   CARDIAC CATHETERIZATION  04/04/2002   Images reviewed: Normal coronaries   COLONOSCOPY N/A 09/06/2019   Procedure: COLONOSCOPY;  Surgeon: Rogene Houston, MD;  [facility];  Service: Endoscopy;  Laterality: N/A;  135   CYST EXCISION Right    cyst removed from arm   ELBOW SURGERY Left    HIP ARTHROPLASTY Right    KIDNEY DONATION Left    Was donor for his brother   KNEE ARTHROSCOPY Right 2013   lft ulnar nerve removed     decompression   NEPHRECTOMY Left    left, donated to his brother, EF 55-60%...   POLYPECTOMY  09/06/2019   Procedure: POLYPECTOMY;  Surgeon: Rogene Houston, MD;  [facility]: [facility];  Service: Endoscopy;;   SCROTAL EXPLORATION N/A 01/01/2021   Procedure: SCROTUM EXPLORATION- excision of scrotal sebacous cyst;  Surgeon: Cleon Gustin, MD;  Location: [facility];  Service: Urology;  Laterality: N/A;   SHOULDER ARTHROSCOPY WITH ROTATOR CUFF REPAIR AND SUBACROMIAL DECOMPRESSION Right 10/14/2017   Procedure: RIGHT SHOULDER ARTHROSCOPY WITH EXTENSIVE DEBRIDEMENT, SUBACROMIAL DECOMPRESSION, DISTAL CLAVICLE EXCISION AND BICEPS TENODYSIS;   Surgeon: Leandrew Koyanagi, MD;  Location: [place];  Service: Orthopedics;  Laterality: Right;   TOTAL HIP ARTHROPLASTY Right 06/07/2012   Procedure: TOTAL HIP ARTHROPLASTY ANTERIOR APPROACH;  Surgeon: Marybelle Killings, MD;  Location: [place];  Service: Orthopedics;  Laterality: Right;  Right Total Hip Arthroplasty-Anterior Approach   TRANSTHORACIC ECHOCARDIOGRAM  04/28/2020   [facility]) EF 65 to 70%.  No or WMA.  GR 1 DD.  Mildly thickened aortic valve.  Mild to moderately dilated left atrium.  Normal RV size and function.  Mild RA dilation.:   ULNAR NERVE TRANSPOSITION Left 11/13/2020   Procedure: left elbow ulnar nerve neurolysis, ganglion cyst removal;  Surgeon: Leandrew Koyanagi, MD;  Location: [place];  Service: Orthopedics;  Laterality: Left;    Home Medications:  Allergies as of 01/16/2021   No Known Allergies      Medication List        Accurate as of January 16, 2021 11:59 PM. If you have any questions, ask your nurse or doctor.          STOP taking these medications    oxyCODONE-acetaminophen 5-325 MG tablet Commonly known as: Percocet Stopped by: Nicolette Bang, MD   sulfamethoxazole-trimethoprim 800-160 MG tablet Commonly known as: BACTRIM DS Stopped by: Nicolette Bang, MD       TAKE these medications    amLODipine 10 MG tablet Commonly known as: NORVASC Take 10 mg by mouth daily.  bacitracin ointment Apply to affected area twice daily   losartan 100 MG tablet Commonly known as: COZAAR Take 100 mg by mouth daily.        Allergies: No Known Allergies  Family History: Family History  Problem Relation Age of Onset   Colon cancer Brother 42   Neurologic Disorder Mother        [name] - by report   Liver disease Father    Other Sister 28       Natural causes   Pancreatic cancer Brother 47       Unknown   Diabetes Mellitus II Brother        Died from DM-Coma @ 41   Diabetic kidney disease Brother  28       End-stage- s/p Txplant ([name] was the Donor)   Lung cancer Brother 69   Other Sister 43   Healthy Sister        He does not know much details   Arthritis Sister    Diabetes type II Sister    Arthritis Sister    Stomach cancer Sister     Social History:  reports that he quit smoking about 22 years ago. His smoking use included cigarettes. He has a 40.00 pack-year smoking history. He has never used smokeless tobacco. He reports that he does not drink alcohol and does not use drugs.  ROS: All other review of systems were reviewed and are negative except what is noted above in HPI  Physical Exam: BP (!) 152/89   Pulse (!) 50   Temp 97.6 F (36.4 C)   Wt 240 lb (108.9 kg)   BMI 34.44 kg/m   Constitutional:  Alert and oriented, No acute distress. HEENT: [place] AT, moist mucus membranes.  Trachea midline, no masses. Cardiovascular: No clubbing, cyanosis, or edema. Respiratory: Normal respiratory effort, no increased work of breathing. GI: Abdomen is soft, nontender, nondistended, no abdominal masses GU: No CVA tenderness.  Lymph: No cervical or inguinal lymphadenopathy. Skin: No rashes, bruises or suspicious lesions. Neurologic: Grossly intact, no focal deficits, moving all 4 extremities. Psychiatric: Normal mood and affect.  Laboratory Data: Lab Results  Component Value Date   WBC 6.2 09/02/2016   HGB 15.3 09/02/2016   HCT 45.0 09/02/2016   MCV 88.9 09/02/2016   PLT 136 (L) 09/02/2016    Lab Results  Component Value Date   CREATININE 1.26 (H) 09/02/2016    No results found for: PSA  No results found for: TESTOSTERONE  No results found for: HGBA1C  Urinalysis    Component Value Date/Time   COLORURINE YELLOW 06/02/2012 1326   APPEARANCEUR Clear 01/16/2021 0953   LABSPEC 1.015 06/02/2012 1326   PHURINE 8.0 06/02/2012 1326   GLUCOSEU Negative 01/16/2021 [place] 06/02/2012 1326   BILIRUBINUR Negative 01/16/2021 [place]  06/02/2012 1326   PROTEINUR Negative 01/16/2021 0953   PROTEINUR NEGATIVE 06/02/2012 1326   UROBILINOGEN 0.2 01/21/2020 1508   UROBILINOGEN 1.0 06/02/2012 1326   NITRITE Negative 01/16/2021 0953   NITRITE NEGATIVE 06/02/2012 1326   LEUKOCYTESUR Negative 01/16/2021 0953    Lab Results  Component Value Date   LABMICR Comment 01/16/2021   WBCUA None seen 12/23/2020   LABEPIT None seen 12/23/2020   BACTERIA None seen 12/23/2020    Pertinent Imaging:  No results found for this or any previous visit.  No results found for this or any previous visit.  No results found for this or any previous visit.  No results found for this or any previous visit.  No results found for this or any previous visit.  No results found for this or any previous visit.  No results found for this or any previous visit.  No results found for this or any previous visit.   Assessment & Plan:    1. Scrotal sebaceous cyst -Continue bacitracin BID to incision  Return in about 8 weeks (around 03/13/2021) for wound check.  Nicolette Bang, MD  [facility] [place]

## 2021-01-28 ENCOUNTER — Ambulatory Visit: Payer: BC Managed Care – PPO | Admitting: Urology

## 2021-01-29 ENCOUNTER — Encounter: Payer: Self-pay | Admitting: Urology

## 2021-01-29 NOTE — Patient Instructions (Signed)
Epidermoid Cyst An epidermoid cyst, also known as epidermal cyst, is a sac made of skin tissue. The sac contains a substance called keratin. Keratin is a protein that is normally secreted through the hair follicles. When keratin becomes trapped in the top layer of skin (epidermis), it can form an epidermoid cyst. Epidermoid cysts can be found anywhere on your body. These cysts are usually harmless (benign), and they may not cause symptoms unless they become inflamed or infected. What are the causes? This condition may be caused by: A blocked hair follicle. A hair that curls and re-enters the skin instead of growing straight out of the skin (ingrown hair). A blocked pore. Irritated skin. An injury to the skin. Certain conditions that are passed along from parent to child (inherited). Human papillomavirus (HPV). This happens rarely when cysts occur on the bottom of the feet. Long-term (chronic) sun damage to the skin. What increases the risk? The following factors may make you more likely to develop an epidermoid cyst: Having acne. Being male. Having an injury to the skin. Being past puberty. Having certain rare genetic disorders. What are the signs or symptoms? The only symptom of this condition may be a small, painless lump underneath the skin. When an epidermal cyst ruptures, it may become inflamed. True infection in cysts is rare. Symptoms may include: Redness. Inflammation. Tenderness. Warmth. Keratin draining from the cyst. Keratin is grayish-white, bad-smelling substance. Pus draining from the cyst. How is this diagnosed? This condition is diagnosed with a physical exam. In some cases, you may have a sample of tissue (biopsy) taken from your cyst to be examined under a microscope or tested for bacteria. You may be referred to a health care provider who specializes in skin care (dermatologist). How is this treated? If a cyst becomes inflamed, treatment may include: Opening and  draining the cyst, done by a health care provider. After draining, minor surgery to remove the rest of the cyst may be done. Taking antibiotic medicine. Having injections of medicines (steroids) that help to reduce inflammation. Having surgery to remove the cyst. Surgery may be done if the cyst: Becomes large. Bothers you. Has a chance of turning into cancer. Do not try to open a cyst yourself. Follow these instructions at home: Medicines If you were prescribed an antibiotic medicine, take it it as told by your health care provider. Do not stop using the antibiotic even if you start to feel better. Take over-the-counter and prescription medicines only as told by your health care provider. General instructions Keep the area around your cyst clean and dry. Wear loose, dry clothing. Avoid touching your cyst. Check your cyst every day for signs of infection. Check for: Redness, swelling, or pain. Fluid or blood. Warmth. Pus or a bad smell. Keep all follow-up visits. This is important. How is this prevented? Wear clean, dry, clothing. Avoid wearing tight clothing. Keep your skin clean and dry. Take showers or baths every day. Contact a health care provider if: Your cyst develops symptoms of infection. Your condition is not improving or is getting worse. You develop a cyst that looks different from other cysts you have had. You have a fever. Get help right away if: Redness spreads from the cyst into the surrounding area. Summary An epidermoid cyst is a sac made of skin tissue. These cysts are usually harmless (benign), and they may not cause symptoms unless they become inflamed. If a cyst becomes inflamed, treatment may include surgery to open and drain the cyst,  or to remove it. Treatment may also include medicines by mouth or through an injection. Take over-the-counter and prescription medicines only as told by your health care provider. If you were prescribed an antibiotic medicine,  take it as told by your health care provider. Do not stop using the antibiotic even if you start to feel better. Contact a health care provider if your condition is not improving or is getting worse. Keep all follow-up visits as told by your health care provider. This is important. This information is not intended to replace advice given to you by your health care provider. Make sure you discuss any questions you have with your health care provider. Document Revised: 06/13/2019 Document Reviewed: 06/13/2019 Elsevier Patient Education  [place].

## 2021-01-29 NOTE — Progress Notes (Signed)
01/21/2021 8:44 AM   Blake Solis [birth year] [id number]  Referring provider: Practice, [facility] [place],  [place] [address]  Followup scrotal sebaceous cyst   HPI: Blake Solis is a 65yo here for followup for a scrotal sebaceous cyst. He was doing well until yesterday when he noted pain and drainage from the incision. No fevers.    PMH: Past Medical History:  Diagnosis Date   Arthritis    Bronchitis, allergic    Essential hypertension    On several medications.   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   Tick bite 01/26/2020    Surgical History: Past Surgical History:  Procedure Laterality Date   CARDIAC CATHETERIZATION  04/04/2002   Images reviewed: Normal coronaries   COLONOSCOPY N/A 09/06/2019   Procedure: COLONOSCOPY;  Surgeon: Rogene Houston, MD;  [facility];  Service: Endoscopy;  Laterality: N/A;  135   CYST EXCISION Right    cyst removed from arm   ELBOW SURGERY Left    HIP ARTHROPLASTY Right    KIDNEY DONATION Left    Was donor for his brother   KNEE ARTHROSCOPY Right 2013   lft ulnar nerve removed     decompression   NEPHRECTOMY Left    left, donated to his brother, EF 55-60%...   POLYPECTOMY  09/06/2019   Procedure: POLYPECTOMY;  Surgeon: Rogene Houston, MD;  [facility];  Service: Endoscopy;;   SCROTAL EXPLORATION N/A 01/01/2021   Procedure: SCROTUM EXPLORATION- excision of scrotal sebacous cyst;  Surgeon: Cleon Gustin, MD;  Location: [facility];  Service: Urology;  Laterality: N/A;   SHOULDER ARTHROSCOPY WITH ROTATOR CUFF REPAIR AND SUBACROMIAL DECOMPRESSION Right 10/14/2017   Procedure: RIGHT SHOULDER ARTHROSCOPY WITH EXTENSIVE DEBRIDEMENT, SUBACROMIAL DECOMPRESSION, DISTAL CLAVICLE EXCISION AND BICEPS TENODYSIS;  Surgeon:  Leandrew Koyanagi, MD;  Location: [place];  Service: Orthopedics;  Laterality: Right;   TOTAL HIP ARTHROPLASTY Right 06/07/2012   Procedure: TOTAL HIP ARTHROPLASTY ANTERIOR APPROACH;  Surgeon: Marybelle Killings, MD;  Location: [place];  Service: Orthopedics;  Laterality: Right;  Right Total Hip Arthroplasty-Anterior Approach   TRANSTHORACIC ECHOCARDIOGRAM  04/28/2020   [facility]) EF 65 to 70%.  No or WMA.  GR 1 DD.  Mildly thickened aortic valve.  Mild to moderately dilated left atrium.  Normal RV size and function.  Mild RA dilation.:   ULNAR NERVE TRANSPOSITION Left 11/13/2020   Procedure: left elbow ulnar nerve neurolysis, ganglion cyst removal;  Surgeon: Leandrew Koyanagi, MD;  Location: [place];  Service: Orthopedics;  Laterality: Left;    Home Medications:  Allergies as of 01/21/2021   No Known Allergies      Medication List        Accurate as of January 21, 2021 11:59 PM. If you have any questions, ask your nurse or doctor.          amLODipine 10 MG tablet Commonly known as: NORVASC Take 10 mg by mouth daily.   amLODipine 10 MG tablet Commonly known as: NORVASC Take 1 tablet by mouth daily.   bacitracin ointment Apply to affected area twice daily   Bacitraycin Plus 500 UNIT/GM ointment Generic drug: bacitracin Apply topically 2 (two) times daily.   doxycycline 100 MG capsule Commonly  known as: VIBRAMYCIN Take 1 capsule (100 mg total) by mouth every 12 (twelve) hours. Started by: Nicolette Bang, MD   losartan 100 MG tablet Commonly known as: COZAAR Take 100 mg by mouth daily.   losartan 100 MG tablet Commonly known as: COZAAR Take 1 tablet by mouth daily.        Allergies: No Known Allergies  Family History: Family History  Problem Relation Age of Onset   Colon cancer Brother 32   Neurologic Disorder Mother        [name] - by report   Liver disease Father    Other Sister 49       Natural causes    Pancreatic cancer Brother 18       Unknown   Diabetes Mellitus II Brother        Died from DM-Coma @ 61   Diabetic kidney disease Brother 55       End-stage- s/p Txplant (Blake Solis was the Donor)   Lung cancer Brother 24   Other Sister 30   Healthy Sister        He does not know much details   Arthritis Sister    Diabetes type II Sister    Arthritis Sister    Stomach cancer Sister     Social History:  reports that he quit smoking about 22 years ago. His smoking use included cigarettes. He has a 40.00 pack-year smoking history. He has never used smokeless tobacco. He reports that he does not drink alcohol and does not use drugs.  ROS: All other review of systems were reviewed and are negative except what is noted above in HPI  Physical Exam: BP (!) 148/72   Pulse (!) 58   Constitutional:  Alert and oriented, No acute distress. HEENT: [place] AT, moist mucus membranes.  Trachea midline, no masses. Cardiovascular: No clubbing, cyanosis, or edema. Respiratory: Normal respiratory effort, no increased work of breathing. GI: Abdomen is soft, nontender, nondistended, no abdominal masses GU: No CVA tenderness.  Lymph: No cervical or inguinal lymphadenopathy. Skin: No rashes, bruises or suspicious lesions. Neurologic: Grossly intact, no focal deficits, moving all 4 extremities. Psychiatric: Normal mood and affect.  Laboratory Data: Lab Results  Component Value Date   WBC 6.2 09/02/2016   HGB 15.3 09/02/2016   HCT 45.0 09/02/2016   MCV 88.9 09/02/2016   PLT 136 (L) 09/02/2016    Lab Results  Component Value Date   CREATININE 1.26 (H) 09/02/2016    No results found for: PSA  No results found for: TESTOSTERONE  No results found for: HGBA1C  Urinalysis    Component Value Date/Time   COLORURINE YELLOW 06/02/2012 1326   APPEARANCEUR Clear 01/16/2021 0953   LABSPEC 1.015 06/02/2012 1326   PHURINE 8.0 06/02/2012 1326   GLUCOSEU Negative 01/16/2021 [place] 06/02/2012  1326   BILIRUBINUR Negative 01/16/2021 [place] 06/02/2012 1326   PROTEINUR Negative 01/16/2021 0953   PROTEINUR NEGATIVE 06/02/2012 1326   UROBILINOGEN 0.2 01/21/2020 1508   UROBILINOGEN 1.0 06/02/2012 1326   NITRITE Negative 01/16/2021 0953   NITRITE NEGATIVE 06/02/2012 1326   LEUKOCYTESUR Negative 01/16/2021 0953    Lab Results  Component Value Date   LABMICR Comment 01/16/2021   WBCUA None seen 12/23/2020   LABEPIT None seen 12/23/2020   BACTERIA None seen 12/23/2020    Pertinent Imaging:  No results found for this or any previous visit.  No results found for this or any previous visit.  No results found for this or any previous visit.  No results found for this or any previous visit.  No results found for this or any previous visit.  No results found for this or any previous visit.  No results found for this or any previous visit.  No results found for this or any previous visit.   Assessment & Plan:    1. Sebaceous cyst of scrotum Doxycycline 100mg  BID for 7 days   Return in about 2 weeks (around 02/04/2021) for wound check.  Nicolette Bang, MD  [facility] [place]

## 2021-02-04 ENCOUNTER — Other Ambulatory Visit: Payer: Self-pay

## 2021-02-04 ENCOUNTER — Ambulatory Visit (INDEPENDENT_AMBULATORY_CARE_PROVIDER_SITE_OTHER): Payer: BC Managed Care – PPO | Admitting: Urology

## 2021-02-04 VITALS — BP 132/77 | HR 54

## 2021-02-04 DIAGNOSIS — L723 Sebaceous cyst: Secondary | ICD-10-CM

## 2021-02-04 NOTE — Progress Notes (Signed)
02/04/2021 8:38 AM   Blake Solis 12-11-1955 [id number]  Referring provider: Practice, [facility] [place],  [place] [address]  Followup scrotal sebaceous cyst   HPI: Mr Blake Solis is a 65yo here for followup after scrotal sebaceous cyst excision. No drainage from his incision. He denies any pain. No swelling from the incision   PMH: Past Medical History:  Diagnosis Date   Arthritis    Bronchitis, allergic    Essential hypertension    On several medications.   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   Tick bite 01/26/2020    Surgical History: Past Surgical History:  Procedure Laterality Date   CARDIAC CATHETERIZATION  04/04/2002   Images reviewed: Normal coronaries   COLONOSCOPY N/A 09/06/2019   Procedure: COLONOSCOPY;  Surgeon: Rogene Houston, MD;  [facility];  Service: Endoscopy;  Laterality: N/A;  135   CYST EXCISION Right    cyst removed from arm   ELBOW SURGERY Left    HIP ARTHROPLASTY Right    KIDNEY DONATION Left    Was donor for his brother   KNEE ARTHROSCOPY Right 2013   lft ulnar nerve removed     decompression   NEPHRECTOMY Left    left, donated to his brother, EF 55-60%...   POLYPECTOMY  09/06/2019   Procedure: POLYPECTOMY;  Surgeon: Rogene Houston, MD;  [facility];  Service: Endoscopy;;   SCROTAL EXPLORATION N/A 01/01/2021   Procedure: SCROTUM EXPLORATION- excision of scrotal sebacous cyst;  Surgeon: Cleon Gustin, MD;  [facility];  Service: Urology;  Laterality: N/A;   SHOULDER ARTHROSCOPY WITH ROTATOR CUFF REPAIR AND SUBACROMIAL DECOMPRESSION Right 10/14/2017   Procedure: RIGHT SHOULDER ARTHROSCOPY WITH EXTENSIVE DEBRIDEMENT, SUBACROMIAL DECOMPRESSION, DISTAL CLAVICLE EXCISION AND BICEPS TENODYSIS;  Surgeon: Leandrew Koyanagi, MD;  Location: [place];  Service: Orthopedics;  Laterality: Right;   TOTAL HIP ARTHROPLASTY Right 06/07/2012   Procedure: TOTAL HIP ARTHROPLASTY ANTERIOR APPROACH;  Surgeon: Marybelle Killings, MD;  Location: [place];  Service: Orthopedics;  Laterality: Right;  Right Total Hip Arthroplasty-Anterior Approach   TRANSTHORACIC ECHOCARDIOGRAM  04/28/2020   [facility]) EF 65 to 70%.  No or WMA.  GR 1 DD.  Mildly thickened aortic valve.  Mild to moderately dilated left atrium.  Normal RV size and function.  Mild RA dilation.:   ULNAR NERVE TRANSPOSITION Left 11/13/2020   Procedure: left elbow ulnar nerve neurolysis, ganglion cyst removal;  Surgeon: Leandrew Koyanagi, MD;  Location: [place];  Service: Orthopedics;  Laterality: Left;    Home Medications:  Allergies as of 02/04/2021   No Known Allergies      Medication List        Accurate as of February 04, 2021  8:38 AM. If you have any questions, ask your nurse or doctor.          amLODipine 10 MG tablet Commonly known as: NORVASC Take 10 mg by mouth daily.   amLODipine 10 MG tablet Commonly known as: NORVASC Take 1 tablet by mouth daily.   bacitracin ointment Apply to affected area twice daily   Bacitraycin Plus 500 UNIT/GM ointment Generic drug: bacitracin Apply topically 2 (two) times daily.   doxycycline 100 MG capsule Commonly known as: VIBRAMYCIN  Take 1 capsule (100 mg total) by mouth every 12 (twelve) hours.   doxycycline 100 MG tablet Commonly known as: VIBRA-TABS Take 100 mg by mouth 2 (two) times daily.   losartan 100 MG tablet Commonly known as: COZAAR Take 100 mg by mouth daily.   losartan 100 MG tablet Commonly known as: COZAAR Take 1 tablet by mouth daily.        Allergies: No Known Allergies  Family History: Family History  Problem Relation Age of Onset   Colon cancer Brother 75   Neurologic Disorder Mother        Blake Solis - by report   Liver  disease Father    Other Sister 77       Natural causes   Pancreatic cancer Brother 32       Unknown   Diabetes Mellitus II Brother        Died from DM-Coma @ 39   Diabetic kidney disease Brother 76       End-stage- s/p Txplant (Blake Solis was the Donor)   Lung cancer Brother 25   Other Sister 73   Healthy Sister        He does not know much details   Arthritis Sister    Diabetes type II Sister    Arthritis Sister    Stomach cancer Sister     Social History:  reports that he quit smoking about 22 years ago. His smoking use included cigarettes. He has a 40.00 pack-year smoking history. He has never used smokeless tobacco. He reports that he does not drink alcohol and does not use drugs.  ROS: All other review of systems were reviewed and are negative except what is noted above in HPI  Physical Exam: BP 132/77   Pulse (!) 54   Constitutional:  Alert and oriented, No acute distress. HEENT: [place] AT, moist mucus membranes.  Trachea midline, no masses. Cardiovascular: No clubbing, cyanosis, or edema. Respiratory: Normal respiratory effort, no increased work of breathing. GI: Abdomen is soft, nontender, nondistended, no abdominal masses GU: No CVA tenderness. Circumcised phallus. No masses/lesions on penis, testis, scrotum. Prostate 40g smooth no nodules no induration.  Lymph: No cervical or inguinal lymphadenopathy. Skin: No rashes, bruises or suspicious lesions. Neurologic: Grossly intact, no focal deficits, moving all 4 extremities. Psychiatric: Normal mood and affect.  Laboratory Data: Lab Results  Component Value Date   WBC 6.2 09/02/2016   HGB 15.3 09/02/2016   HCT 45.0 09/02/2016   MCV 88.9 09/02/2016   PLT 136 (L) 09/02/2016    Lab Results  Component Value Date   CREATININE 1.26 (H) 09/02/2016    No results found for: PSA  No results found for: TESTOSTERONE  No results found for: HGBA1C  Urinalysis    Component Value Date/Time   COLORURINE YELLOW 06/02/2012 1326    APPEARANCEUR Clear 01/16/2021 0953   LABSPEC 1.015 06/02/2012 1326   PHURINE 8.0 06/02/2012 1326   GLUCOSEU Negative 01/16/2021 [place] 06/02/2012 1326   BILIRUBINUR Negative 01/16/2021 [place] 06/02/2012 1326   PROTEINUR Negative 01/16/2021 0953   PROTEINUR NEGATIVE 06/02/2012 1326   UROBILINOGEN 0.2 01/21/2020 1508   UROBILINOGEN 1.0 06/02/2012 1326   NITRITE Negative 01/16/2021 0953   NITRITE NEGATIVE 06/02/2012 1326   LEUKOCYTESUR Negative 01/16/2021 0953    Lab Results  Component Value Date   LABMICR Comment 01/16/2021   WBCUA None seen 12/23/2020   LABEPIT None seen 12/23/2020   BACTERIA None seen 12/23/2020  Pertinent Imaging:  No results found for this or any previous visit.  No results found for this or any previous visit.  No results found for this or any previous visit.  No results found for this or any previous visit.  No results found for this or any previous visit.  No results found for this or any previous visit.  No results found for this or any previous visit.  No results found for this or any previous visit.   Assessment & Plan:    1. Sebaceous cyst of scrotum Bacitracin BID for 21 days Return in about 1 year (around 02/04/2022).  Nicolette Bang, MD  [facility] [place]

## 2021-03-13 ENCOUNTER — Ambulatory Visit: Payer: BC Managed Care – PPO | Admitting: Urology

## 2021-03-26 ENCOUNTER — Ambulatory Visit: Payer: BC Managed Care – PPO | Admitting: Urology

## 2021-03-26 ENCOUNTER — Ambulatory Visit: Payer: BC Managed Care – PPO | Admitting: Physician Assistant

## 2021-07-22 ENCOUNTER — Other Ambulatory Visit: Payer: Self-pay | Admitting: Podiatry

## 2021-07-23 ENCOUNTER — Other Ambulatory Visit: Payer: Self-pay | Admitting: Podiatry

## 2021-07-23 DIAGNOSIS — R2242 Localized swelling, mass and lump, left lower limb: Secondary | ICD-10-CM

## 2021-07-26 ENCOUNTER — Ambulatory Visit
Admission: RE | Admit: 2021-07-26 | Discharge: 2021-07-26 | Disposition: A | Discharge status: Home / Self Care | Payer: BC Managed Care – PPO | Source: Ambulatory Visit | Attending: Podiatry | Admitting: Podiatry

## 2021-07-26 DIAGNOSIS — R2242 Localized swelling, mass and lump, left lower limb: Secondary | ICD-10-CM

## 2021-07-27 ENCOUNTER — Other Ambulatory Visit: Payer: BC Managed Care – PPO

## 2021-09-09 ENCOUNTER — Other Ambulatory Visit: Payer: Self-pay

## 2021-09-09 ENCOUNTER — Encounter (HOSPITAL_BASED_OUTPATIENT_CLINIC_OR_DEPARTMENT_OTHER): Payer: Self-pay | Admitting: Podiatry

## 2021-09-09 NOTE — Progress Notes (Signed)
Spoke w/ via phone for pre-op interview--- Blake Solis Lab needs dos---- EKG              Lab results------ COVID test -----patient states asymptomatic no test needed Arrive at -------1050 NPO after MN NO Solid Food.  Clear liquids from MN until---0950 Med rec completed Medications to take morning of surgery -----Amlodipine Diabetic medication ----- Patient instructed no nail polish to be worn day of surgery Patient instructed to bring photo id and insurance card day of surgery Patient aware to have Driver (ride ) / caregiver   [name]  for 24 hours after surgery  Patient Special Instructions ----- Pre-Op special Istructions ----- Patient verbalized understanding of instructions that were given at this phone interview. Patient denies shortness of breath, chest pain, fever, cough at this phone interview.

## 2021-09-14 ENCOUNTER — Ambulatory Visit (HOSPITAL_BASED_OUTPATIENT_CLINIC_OR_DEPARTMENT_OTHER): Payer: BC Managed Care – PPO | Admitting: Anesthesiology

## 2021-09-14 ENCOUNTER — Ambulatory Visit (HOSPITAL_BASED_OUTPATIENT_CLINIC_OR_DEPARTMENT_OTHER)
Admission: RE | Admit: 2021-09-14 | Discharge: 2021-09-14 | Disposition: A | Discharge status: Home / Self Care | Payer: BC Managed Care – PPO | Attending: Podiatry | Admitting: Podiatry

## 2021-09-14 ENCOUNTER — Other Ambulatory Visit: Payer: Self-pay

## 2021-09-14 ENCOUNTER — Encounter (HOSPITAL_BASED_OUTPATIENT_CLINIC_OR_DEPARTMENT_OTHER)
Admission: RE | Disposition: A | Discharge status: Home / Self Care | Payer: Self-pay | Source: Home / Self Care | Attending: Podiatry

## 2021-09-14 ENCOUNTER — Encounter (HOSPITAL_BASED_OUTPATIENT_CLINIC_OR_DEPARTMENT_OTHER): Payer: Self-pay | Admitting: Podiatry

## 2021-09-14 DIAGNOSIS — Z87891 Personal history of nicotine dependence: Secondary | ICD-10-CM | POA: Insufficient documentation

## 2021-09-14 DIAGNOSIS — I10 Essential (primary) hypertension: Secondary | ICD-10-CM | POA: Diagnosis not present

## 2021-09-14 DIAGNOSIS — R2242 Localized swelling, mass and lump, left lower limb: Secondary | ICD-10-CM

## 2021-09-14 DIAGNOSIS — G5762 Lesion of plantar nerve, left lower limb: Secondary | ICD-10-CM | POA: Diagnosis not present

## 2021-09-14 DIAGNOSIS — Z79899 Other long term (current) drug therapy: Secondary | ICD-10-CM | POA: Insufficient documentation

## 2021-09-14 DIAGNOSIS — M722 Plantar fascial fibromatosis: Secondary | ICD-10-CM | POA: Insufficient documentation

## 2021-09-14 HISTORY — PX: MASS EXCISION: SHX2000

## 2021-09-14 SURGERY — EXCISION MASS
Anesthesia: Monitor Anesthesia Care | Site: Foot | Laterality: Left

## 2021-09-14 MED ORDER — PROPOFOL 10 MG/ML IV BOLUS
INTRAVENOUS | Status: AC
  Filled 2021-09-14: qty 20

## 2021-09-14 MED ORDER — OXYCODONE HCL 5 MG/5ML PO SOLN: 5.0000 mg | Freq: Once | ORAL | Status: AC | PRN

## 2021-09-14 MED ORDER — PROPOFOL 500 MG/50ML IV EMUL
INTRAVENOUS | Status: DC | PRN
  Administered 2021-09-14: 100 ug/kg/min via INTRAVENOUS

## 2021-09-14 MED ORDER — KETAMINE HCL 50 MG/5ML IJ SOSY
PREFILLED_SYRINGE | INTRAMUSCULAR | Status: AC
  Filled 2021-09-14: qty 5

## 2021-09-14 MED ORDER — PROPOFOL 10 MG/ML IV BOLUS
INTRAVENOUS | Status: DC | PRN
  Administered 2021-09-14: 20 mg via INTRAVENOUS

## 2021-09-14 MED ORDER — OXYCODONE HCL 5 MG PO TABS
ORAL_TABLET | ORAL | Status: AC
  Filled 2021-09-14: qty 1

## 2021-09-14 MED ORDER — CELECOXIB 200 MG PO CAPS
200.0000 mg | ORAL_CAPSULE | Freq: Once | ORAL | Status: AC
  Administered 2021-09-14: 200 mg via ORAL

## 2021-09-14 MED ORDER — SODIUM CHLORIDE 0.9 % IV SOLN: INTRAVENOUS | Status: DC

## 2021-09-14 MED ORDER — FENTANYL CITRATE (PF) 100 MCG/2ML IJ SOLN
INTRAMUSCULAR | Status: DC | PRN
  Administered 2021-09-14: 25 ug via INTRAVENOUS

## 2021-09-14 MED ORDER — KETAMINE HCL 10 MG/ML IJ SOLN
INTRAMUSCULAR | Status: DC | PRN
  Administered 2021-09-14 (×2): 5 mg via INTRAVENOUS

## 2021-09-14 MED ORDER — ACETAMINOPHEN 500 MG PO TABS
1000.0000 mg | ORAL_TABLET | Freq: Once | ORAL | Status: AC
Start: 2021-09-14 — End: 2021-09-14
  Administered 2021-09-14: 1000 mg via ORAL

## 2021-09-14 MED ORDER — AMISULPRIDE (ANTIEMETIC) 5 MG/2ML IV SOLN
10.0000 mg | Freq: Once | INTRAVENOUS | Status: DC | PRN
Start: 2021-09-14 — End: 2021-09-14

## 2021-09-14 MED ORDER — ONDANSETRON HCL 4 MG/2ML IJ SOLN
INTRAMUSCULAR | Status: DC | PRN
  Administered 2021-09-14: 4 mg via INTRAVENOUS

## 2021-09-14 MED ORDER — ACETAMINOPHEN 500 MG PO TABS
ORAL_TABLET | ORAL | Status: AC
  Filled 2021-09-14: qty 2

## 2021-09-14 MED ORDER — CELECOXIB 200 MG PO CAPS
ORAL_CAPSULE | ORAL | Status: AC
  Filled 2021-09-14: qty 1

## 2021-09-14 MED ORDER — BUPIVACAINE HCL (PF) 0.5 % IJ SOLN
INTRAMUSCULAR | Status: AC
  Filled 2021-09-14: qty 30

## 2021-09-14 MED ORDER — MIDAZOLAM HCL 2 MG/2ML IJ SOLN
INTRAMUSCULAR | Status: AC
  Filled 2021-09-14: qty 2

## 2021-09-14 MED ORDER — FENTANYL CITRATE (PF) 100 MCG/2ML IJ SOLN: 25.0000 ug | INTRAMUSCULAR | Status: DC | PRN

## 2021-09-14 MED ORDER — LIDOCAINE HCL 2 % IJ SOLN
INTRAMUSCULAR | Status: DC | PRN
  Administered 2021-09-14: 10 mL

## 2021-09-14 MED ORDER — OXYCODONE HCL 5 MG PO TABS
5.0000 mg | ORAL_TABLET | Freq: Once | ORAL | Status: AC | PRN
  Administered 2021-09-14: 5 mg via ORAL

## 2021-09-14 MED ORDER — FENTANYL CITRATE (PF) 100 MCG/2ML IJ SOLN
INTRAMUSCULAR | Status: AC
  Filled 2021-09-14: qty 2

## 2021-09-14 MED ORDER — LIDOCAINE HCL (CARDIAC) PF 100 MG/5ML IV SOSY
PREFILLED_SYRINGE | INTRAVENOUS | Status: DC | PRN
  Administered 2021-09-14: 40 mg via INTRAVENOUS

## 2021-09-14 MED ORDER — LIDOCAINE HCL 2 % IJ SOLN
INTRAMUSCULAR | Status: AC
  Filled 2021-09-14: qty 20

## 2021-09-14 SURGICAL SUPPLY — 61 items
BAG DECANTER FOR FLEXI CONT (MISCELLANEOUS) IMPLANT
BLADE AVERAGE 25X9 (BLADE) IMPLANT
BLADE OSC/SAG .038X5.5 CUT EDG (BLADE) IMPLANT
BLADE OSC/SAGITTAL MD 5.5X18 (BLADE) IMPLANT
BLADE OSCIL/SAGITTAL W/10 ST (BLADE) IMPLANT
BLADE SURG 10 STRL SS (BLADE) ×1 IMPLANT
BLADE SURG 15 STRL LF DISP TIS (BLADE) ×2 IMPLANT
BLADE SURG 15 STRL SS (BLADE) ×4
BNDG CMPR 5X3 CHSV STRCH STRL (GAUZE/BANDAGES/DRESSINGS)
BNDG CMPR 9X4 STRL LF SNTH (GAUZE/BANDAGES/DRESSINGS)
BNDG COHESIVE 2X5 TAN ST LF (GAUZE/BANDAGES/DRESSINGS) IMPLANT
BNDG COHESIVE 3X5 TAN ST LF (GAUZE/BANDAGES/DRESSINGS) IMPLANT
BNDG COHESIVE 4X5 TAN ST LF (GAUZE/BANDAGES/DRESSINGS) IMPLANT
BNDG ELASTIC 4X5.8 VLCR STR LF (GAUZE/BANDAGES/DRESSINGS) ×2 IMPLANT
BNDG ESMARK 4X9 LF (GAUZE/BANDAGES/DRESSINGS) IMPLANT
CNTNR URN SCR LID CUP LEK RST (MISCELLANEOUS) IMPLANT
CONT SPEC 4OZ STRL OR WHT (MISCELLANEOUS) ×4
COVER BACK TABLE 60X90IN (DRAPES) ×2 IMPLANT
COVER MAYO STAND STRL (DRAPES) ×2 IMPLANT
CUFF TOURN SGL QUICK 18X4 (TOURNIQUET CUFF) ×1 IMPLANT
DECANTER SPIKE VIAL GLASS SM (MISCELLANEOUS) IMPLANT
DRAPE EXTREMITY T 121X128X90 (DISPOSABLE) ×2 IMPLANT
DRAPE IMP U-DRAPE 54X76 (DRAPES) ×1 IMPLANT
DRAPE SHEET LG 3/4 BI-LAMINATE (DRAPES) ×1 IMPLANT
DRAPE SURG 17X23 STRL (DRAPES) ×2 IMPLANT
DRSG EMULSION OIL 3X3 NADH (GAUZE/BANDAGES/DRESSINGS) ×2 IMPLANT
DURAPREP 26ML APPLICATOR (WOUND CARE) ×2 IMPLANT
ELECT REM PT RETURN 9FT ADLT (ELECTROSURGICAL) ×2
ELECTRODE REM PT RTRN 9FT ADLT (ELECTROSURGICAL) ×1 IMPLANT
GAUZE 4X4 16PLY ~~LOC~~+RFID DBL (SPONGE) ×2 IMPLANT
GAUZE SPONGE 4X4 12PLY STRL (GAUZE/BANDAGES/DRESSINGS) ×2 IMPLANT
GLOVE BIO SURGEON STRL SZ7.5 (GLOVE) ×2 IMPLANT
GLOVE BIOGEL PI IND STRL 7.0 (GLOVE) IMPLANT
GLOVE BIOGEL PI INDICATOR 7.0 (GLOVE) ×1
GOWN STRL REUS W/TWL LRG LVL3 (GOWN DISPOSABLE) ×3 IMPLANT
KIT TURNOVER CYSTO (KITS) ×2 IMPLANT
NDL SAFETY ECLIPSE 18X1.5 (NEEDLE) ×1 IMPLANT
NEEDLE HYPO 18GX1.5 SHARP (NEEDLE)
NEEDLE HYPO 22GX1.5 SAFETY (NEEDLE) ×4 IMPLANT
NS IRRIG 500ML POUR BTL (IV SOLUTION) ×2 IMPLANT
PACK BASIN DAY SURGERY FS (CUSTOM PROCEDURE TRAY) ×2 IMPLANT
PAD CAST 4YDX4 CTTN HI CHSV (CAST SUPPLIES) IMPLANT
PADDING CAST ABS 4INX4YD NS (CAST SUPPLIES)
PADDING CAST ABS COTTON 4X4 ST (CAST SUPPLIES) ×2 IMPLANT
PADDING CAST COTTON 4X4 STRL (CAST SUPPLIES) ×4
PENCIL SMOKE EVACUATOR (MISCELLANEOUS) ×2 IMPLANT
SPONGE T-LAP 4X18 ~~LOC~~+RFID (SPONGE) IMPLANT
STAPLER VISISTAT (STAPLE) ×1 IMPLANT
STAPLER VISISTAT 35W (STAPLE) ×1 IMPLANT
STOCKINETTE 6  STRL (DRAPES)
STOCKINETTE 6 STRL (DRAPES) IMPLANT
SUCTION FRAZIER HANDLE 10FR (MISCELLANEOUS) ×2
SUCTION TUBE FRAZIER 10FR DISP (MISCELLANEOUS) ×1 IMPLANT
SUT ETHILON 3 0 PS 1 (SUTURE) ×3 IMPLANT
SUT VIC AB 3-0 SH 27 (SUTURE) ×4
SUT VIC AB 3-0 SH 27X BRD (SUTURE) IMPLANT
SYR BULB EAR ULCER 3OZ GRN STR (SYRINGE) ×2 IMPLANT
SYR CONTROL 10ML LL (SYRINGE) ×4 IMPLANT
TUBE CONNECTING 12X1/4 (SUCTIONS) ×2 IMPLANT
UNDERPAD 30X36 HEAVY ABSORB (UNDERPADS AND DIAPERS) ×2 IMPLANT
WATER STERILE IRR 500ML POUR (IV SOLUTION) ×1 IMPLANT

## 2021-09-14 NOTE — H&P (Signed)
History and Physical Interval Note:  09/14/2021 11:32 AM  Blake Solis  has presented today for surgery, with the diagnosis of mass of left foot.  The various methods of treatment have been discussed with the patient and family. After consideration of risks, benefits and other options for treatment, the patient has consented to   Procedure(s) with comments: EXCISION SOFT TISSUE MASS LEFT FOOT (Left) - MAC WITH LOCAL as a surgical intervention.  The patient's history has been reviewed, patient examined, no change in status, stable for surgery.  I have reviewed the patient's chart and labs.  Questions were answered to the patient's satisfaction.     Eris Hannan Paulla Dolly

## 2021-09-14 NOTE — Anesthesia Preprocedure Evaluation (Addendum)
Anesthesia Evaluation  Patient identified by MRN, date of birth, ID band Patient awake    Reviewed: Allergy & Precautions, NPO status , Patient's Chart, lab work & pertinent test results  History of Anesthesia Complications Negative for: history of anesthetic complications  Airway Mallampati: II  TM Distance: >3 FB Neck ROM: Full    Dental no notable dental hx. (+) Dental Advisory Given, Edentulous Upper   Pulmonary former smoker,    Pulmonary exam normal breath sounds clear to auscultation       Cardiovascular Exercise Tolerance: Good hypertension, Pt. on medications Normal cardiovascular exam Rhythm:Regular Rate:Normal  Sinus bradycardia   Neuro/Psych  Neuromuscular disease negative psych ROS   GI/Hepatic negative GI ROS, Neg liver ROS,   Endo/Other  negative endocrine ROS  Renal/GU Renal InsufficiencyRenal disease (kidney donation, creatinine - 1.47)     Musculoskeletal  (+) Arthritis , Osteoarthritis,    Abdominal   Peds  Hematology negative hematology ROS (+)   Anesthesia Other Findings   Reproductive/Obstetrics                            Anesthesia Physical Anesthesia Plan  ASA: 3  Anesthesia Plan: MAC   Post-op Pain Management: Tylenol PO (pre-op)* and Celebrex PO (pre-op)*   Induction: Intravenous  PONV Risk Score and Plan: 2 and Ondansetron, Treatment may vary due to age or medical condition, Propofol infusion, Midazolam and Dexamethasone  Airway Management Planned: Natural Airway  Additional Equipment:   Intra-op Plan:   Post-operative Plan:   Informed Consent: I have reviewed the patients History and Physical, chart, labs and discussed the procedure including the risks, benefits and alternatives for the proposed anesthesia with the patient or authorized representative who has indicated his/her understanding and acceptance.     Dental advisory given  Plan  Discussed with: CRNA  Anesthesia Plan Comments:        Anesthesia Quick Evaluation

## 2021-09-14 NOTE — Anesthesia Postprocedure Evaluation (Signed)
Anesthesia Post Note  Patient: Blake Solis  Procedure(s) Performed: EXCISION SOFT TISSUE MASS LEFT FOOT (Left: Foot)     Patient location during evaluation: PACU Anesthesia Type: MAC Level of consciousness: awake and alert Pain management: pain level controlled Vital Signs Assessment: post-procedure vital signs reviewed and stable Respiratory status: spontaneous breathing Cardiovascular status: stable Anesthetic complications: no   No notable events documented.  Last Vitals:  Vitals:   09/14/21 1300 09/14/21 1310  BP: 121/72 (!) 140/95  Pulse: (!) 44 (!) 47  Resp: 13 14  Temp:  (!) 36.4 C  SpO2: 94% 95%    Last Pain:  Vitals:   09/14/21 1300  TempSrc:   PainSc: 0-No pain                 Lewie Loron

## 2021-09-14 NOTE — Op Note (Signed)
OPERATIVE REPORT Patient name: Blake Solis MRN: [id number] DOB: 02-05-1956  DOS:  09/14/2021  Preop Dx: Essential hypertension - Plan: EKG 12 lead per protocol, EKG 12 lead per protocol  Foot mass, left - Plan: DME Crutches, Post-op shoe Postop Dx: same  Procedure:  1. Excision of mass left foot.  Surgeon: Hinton Rao, DPM  Anesthesia:  2% lidocaine plaintotaling 10 cc infiltrated in the patient's left lower extremity  Hemostasis: Ankle tourniquet inflated to a pressure of after esmarch exsanguination   EBL: 5 mL Materials: none Injectables: none Pathology: 2 samples obtained, 1 from plantar left 3rd metatarsal, 1 from plantar 2nd metatarsal.  Condition: The patient tolerated the procedure and anesthesia well. No complications noted or reported   Justification for procedure: The patient is a 66 y.o. male who presents today for surgical correction of mass of left foot. All conservative modalities of been unsuccessful in providing any sort of satisfactory alleviation of symptoms with the patient. The patient was identified preoperatively and the correct operative extremity was marked by myself. Patient was educated pre-operatively on risks, complications, and alternatives. The patient consented for surgical correction. The patient consent form was reviewed. All patient questions were answered. No guarantees were expressed or implied. The patient and the surgeon both signed the patient consent form with the witness present and placed in the patient's chart.   Procedure in Detail: The patient was brought to the operating room, placed on the operating table in a supine position and anesthesia was induced. The left lower extremity was prepped and draped in a standard sterile fashion and a timeout was performed to verify the correct operative site. Attention was then directed to the surgical area where procedure number one commenced.  Procedure #1:   Excision of mass of left  foot.  Approximately 8 centimeter curvilinear incision was made aspect of the left.  The tissues undermined making sure to protect the underlying  Vascular and neurologic  structures. Approximately 2 cm long centimeter x 2 cm wide firm mass was noted the the plantar aspect of the second metatarsal phalangeal joint. The mass was undermined and significant amount of adhesions were noted to the surrounding subcutaneous fat. Careful dissection was performed to remove the mass in its entirety. A segment of skin was then excised with the mass.   Attention was next directed to the plantar aspect of the second metatarsal phalangeal joint, where a second area of firm well adhered tissue was noted. The tissue was noted to be confluent with the plantar second intermetatarsal nerve. The nerve was noted to be attenuated and erythematous. The abnormal plantar nerve was then excised with the plantar mass and sent to pathology for identification. A thorough flush of the wound was performed and the subcutaneous tissues were re-approximated utilizing 3-0 vicryl suture. Skin closure was performed with skin staples.  Dry sterile compressive dressings were then applied to all previously mentioned incision sites about the patient's lower extremity. The tourniquet which was used for hemostasis was deflated. All normal neurovascular responses including pink color and warmth returned all the digits of patient's lower extremity.  The patient was then transferred from the operating room to the recovery room having tolerated the procedure and anesthesia well. All vital signs are stable. After a brief stay in the recovery room the patient was discharged home with adequate prescriptions for analgesia. Verbal as well as written instructions were provided for the patient regarding wound care. The patient is to keep the dressings clean dry  and intact until they are to follow surgeon Dr. Hinton Rao, DPM in the office upon discharge.    Hinton Rao DPM

## 2021-09-15 ENCOUNTER — Encounter (HOSPITAL_BASED_OUTPATIENT_CLINIC_OR_DEPARTMENT_OTHER): Payer: Self-pay | Admitting: Podiatry

## 2021-09-15 ENCOUNTER — Other Ambulatory Visit: Payer: Self-pay

## 2021-09-15 LAB — SURGICAL PATHOLOGY

## 2021-10-05 ENCOUNTER — Encounter: Payer: Self-pay | Admitting: Gastroenterology

## 2021-10-29 ENCOUNTER — Ambulatory Visit: Payer: BC Managed Care – PPO | Admitting: Gastroenterology

## 2021-11-04 ENCOUNTER — Encounter: Payer: BC Managed Care – PPO | Admitting: Physician Assistant

## 2021-11-06 NOTE — Progress Notes (Signed)
This encounter was created in error - please disregard.

## 2021-11-17 ENCOUNTER — Ambulatory Visit: Payer: BC Managed Care – PPO | Admitting: Gastroenterology

## 2021-11-18 ENCOUNTER — Ambulatory Visit (INDEPENDENT_AMBULATORY_CARE_PROVIDER_SITE_OTHER): Payer: BC Managed Care – PPO

## 2021-11-18 ENCOUNTER — Encounter: Payer: Self-pay | Admitting: Orthopaedic Surgery

## 2021-11-18 ENCOUNTER — Ambulatory Visit (INDEPENDENT_AMBULATORY_CARE_PROVIDER_SITE_OTHER): Payer: BC Managed Care – PPO | Admitting: Orthopaedic Surgery

## 2021-11-18 DIAGNOSIS — M25522 Pain in left elbow: Secondary | ICD-10-CM | POA: Diagnosis not present

## 2021-11-18 NOTE — Progress Notes (Signed)
Office Visit Note   Patient: Blake Solis           Date of Birth: 09-Oct-1955           MRN: [id number] Visit Date: 11/18/2021              Requested by: Practice, [facility] [place],  [place] [id number] PCP: [facility]   Assessment & Plan: Visit Diagnoses:  1. Left elbow pain     Plan: Impression is recurrent left medial elbow cyst.  Currently not interfering significantly with ADLs or with sleeping.  Treatment options explained to include surgical excision versus ultrasound-guided aspiration versus monitoring and their associated pros and cons.  Since is not overly symptomatic right now I think best course of action is to just monitor it for now.  He understands that revision surgery is a little bit more complex due to the presence of scar tissue and adhesions from prior surgery which could increase risk for ulnar nerve injury.    Follow-Up Instructions: No follow-ups on file.   Orders:  Orders Placed This Encounter  Procedures   XR Elbow Complete Left (3+View)   No orders of the defined types were placed in this encounter.     Procedures: No procedures performed   Clinical Data: No additional findings.   Subjective: Chief Complaint  Patient presents with   Left Elbow - Pain    HPI patient is a pleasant 66 year old gentleman who comes in today with concerns about his left elbow.  History of left elbow cyst excision x2 with most recent surgery on 11/13/2020 which should also include an ulnar nerve neurolysis.  Patient was doing well until about a week ago when he noticed a recurrent cyst to the medial aspect of the left elbow.  It is tender to the touch.  Has not taken Tylenol without relief.  He does note slight paresthesias to the left ring and small fingers.  He denies any fevers or chills or any other constitutional symptoms.  Review of Systems as detailed in HPI.  All others reviewed and are negative.   Objective: Vital Signs: There  were no vitals taken for this visit.  Physical Exam well-developed well-nourished gentleman in no acute distress.  Alert and oriented x3.  Ortho Exam examination of the left elbow reveals fully healed surgical scar.  Small palpable medial sided cystic lesion.  No aggressive findings.  No distal ulnar nerve symptoms.  Elbow range of motion is normal.  Specialty Comments:  No specialty comments available.  Imaging: XR Elbow Complete Left (3+View)  Result Date: 11/18/2021 X-rays demonstrate moderate degenerative changes throughout the elbow.  He does have a large osteophyte off of the olecranon.    PMFS History: Patient Active Problem List   Diagnosis Date Noted   Scrotal sebaceous cyst 12/24/2020   Ganglion of left elbow 10/28/2020   Chest pain with high risk for cardiac etiology 04/29/2020   Renal cyst 01/21/2020   Benign prostatic hyperplasia with urinary obstruction 01/21/2020   Nocturia 01/21/2020   S/P arthroscopy of right shoulder 11/01/2017   Superior glenoid labrum lesion of right shoulder    Arthrosis of right acromioclavicular joint    Tendinopathy of rotator cuff, right    Impingement syndrome of right shoulder    Nontraumatic tear of right supraspinatus tendon 09/29/2017   Cubital tunnel syndrome on left 05/13/2017   Avascular necrosis of right femoral head ([place]) 06/07/2012    Class: Diagnosis of  Essential hypertension 07/08/2010   Hyperlipidemia due to dietary fat intake 07/08/2010   Prostatitis 07/08/2010   Degenerative disc disease 07/08/2010   Hydrocele of testis 07/08/2010   Obesity (BMI 35.0-39.9 without comorbidity) 09/08/2009   Past Medical History:  Diagnosis Date   Arthritis    Bronchitis, allergic    Essential hypertension    On several medications.   Hyperlipidemia due to dietary fat intake    Hypertension    Obesity (BMI 35.0-39.9 without comorbidity) 04/29/2020   BMI 35.8   Solitary kidney, acquired    Status post left nephrectomy-was a  kidney donor for his brother.   Tick bite 01/26/2020    Family History  Problem Relation Age of Onset   Colon cancer Brother 58   Neurologic Disorder Mother        [name] - by report   Liver disease Father    Other Sister 42       Natural causes   Pancreatic cancer Brother 47       Unknown   Diabetes Mellitus II Brother        Died from DM-Coma @ 62   Diabetic kidney disease Brother 58       End-stage- s/p Txplant ([facility] was the Donor)   Lung cancer Brother 63   Other Sister 27   Healthy Sister        He does not know much details   Arthritis Sister    Diabetes type II Sister    Arthritis Sister    Stomach cancer Sister     Past Surgical History:  Procedure Laterality Date   CARDIAC CATHETERIZATION  04/04/2002   Images reviewed: Normal coronaries   COLONOSCOPY N/A 09/06/2019   Procedure: COLONOSCOPY;  Surgeon: Rogene Houston, MD;  [facility];  Service: Endoscopy;  Laterality: N/A;  135   CYST EXCISION Right    cyst removed from arm   ELBOW SURGERY Left    HIP ARTHROPLASTY Right    KIDNEY DONATION Left    Was donor for his brother   KNEE ARTHROSCOPY Right 2013   lft ulnar nerve removed     decompression   MASS EXCISION Left 09/14/2021   Procedure: EXCISION SOFT TISSUE MASS LEFT FOOT;  Surgeon: Jobe Igo, DPM;  Location: [place];  Service: Podiatry;  Laterality: Left;  MAC WITH LOCAL   NEPHRECTOMY Left    left, donated to his brother, EF 55-60%...   POLYPECTOMY  09/06/2019   Procedure: POLYPECTOMY;  Surgeon: Rogene Houston, MD;  [facility];  Service: Endoscopy;;   SCROTAL EXPLORATION N/A 01/01/2021   Procedure: SCROTUM EXPLORATION- excision of scrotal sebacous cyst;  Surgeon: Cleon Gustin, MD;  [facility];  Service: Urology;  Laterality: N/A;   SHOULDER ARTHROSCOPY WITH ROTATOR CUFF REPAIR AND SUBACROMIAL DECOMPRESSION Right 10/14/2017   Procedure: RIGHT SHOULDER ARTHROSCOPY WITH EXTENSIVE  DEBRIDEMENT, SUBACROMIAL DECOMPRESSION, DISTAL CLAVICLE EXCISION AND BICEPS TENODYSIS;  Surgeon: Leandrew Koyanagi, MD;  Location: [facility];  Service: Orthopedics;  Laterality: Right;   TOTAL HIP ARTHROPLASTY Right 06/07/2012   Procedure: TOTAL HIP ARTHROPLASTY ANTERIOR APPROACH;  Surgeon: Marybelle Killings, MD;  Location: [place];  Service: Orthopedics;  Laterality: Right;  Right Total Hip Arthroplasty-Anterior Approach   TRANSTHORACIC ECHOCARDIOGRAM  04/28/2020   [facility]) EF 65 to 70%.  No or WMA.  GR 1 DD.  Mildly thickened aortic valve.  Mild to moderately dilated left atrium.  Normal RV  size and function.  Mild RA dilation.:   ULNAR NERVE TRANSPOSITION Left 11/13/2020   Procedure: left elbow ulnar nerve neurolysis, ganglion cyst removal;  Surgeon: Leandrew Koyanagi, MD;  Location: [place];  Service: Orthopedics;  Laterality: Left;   Social History   Occupational History   Occupation: Field seismologist: [employer]: Now called [employer]  Tobacco Use   Smoking status: Former    Packs/day: 1.00    Years: 40.00    Total pack years: 40.00    Types: Cigarettes    Quit date: 04/22/1998    Years since quitting: 23.5   Smokeless tobacco: Never  Vaping Use   Vaping Use: Never used  Substance and Sexual Activity   Alcohol use: No   Drug use: No   Sexual activity: Yes

## 2021-12-01 ENCOUNTER — Ambulatory Visit: Payer: BC Managed Care – PPO | Admitting: Gastroenterology

## 2021-12-04 ENCOUNTER — Ambulatory Visit: Payer: BC Managed Care – PPO | Attending: Cardiology

## 2021-12-04 ENCOUNTER — Ambulatory Visit: Payer: BC Managed Care – PPO | Attending: Cardiology | Admitting: Cardiology

## 2021-12-04 ENCOUNTER — Encounter: Payer: Self-pay | Admitting: Cardiology

## 2021-12-04 VITALS — BP 125/60 | HR 52 | Ht 70.0 in | Wt 237.6 lb

## 2021-12-04 DIAGNOSIS — E669 Obesity, unspecified: Secondary | ICD-10-CM | POA: Diagnosis not present

## 2021-12-04 DIAGNOSIS — R079 Chest pain, unspecified: Secondary | ICD-10-CM | POA: Diagnosis not present

## 2021-12-04 DIAGNOSIS — R001 Bradycardia, unspecified: Secondary | ICD-10-CM

## 2021-12-04 DIAGNOSIS — I10 Essential (primary) hypertension: Secondary | ICD-10-CM | POA: Diagnosis not present

## 2021-12-04 DIAGNOSIS — E7849 Other hyperlipidemia: Secondary | ICD-10-CM

## 2021-12-04 NOTE — Assessment & Plan Note (Signed)
Chest pain that happens with vigorous exertion sounds if it is anginal in nature and would be class II angina however, this is not a new symptom, but it is definitely exertional and not at rest.  We had planned a stress test last office on but he never fell through with it.  At this time I think I would like to see his exercise tolerance and and heart rate responsiveness to exercise so would like to do a treadmill portion of the Myoview.  If he is not able to complete target heart rate I would asked that they at least record his heart rate responsiveness and monitor EKG prior to her being converted to [place].  He is on amlodipine which is intentional as well as losartan for reduction.

## 2021-12-04 NOTE — Assessment & Plan Note (Signed)
Blood pressure looks great on ARB and calcium channel blocker.  Pretty much at max dose of both of these. No beta-blocker because of bradycardia.

## 2021-12-04 NOTE — Patient Instructions (Addendum)
Medication Instructions:  Your physician recommends that you continue on your current medications as directed. Please refer to the Current Medication list given to you today.  *If you need a refill on your cardiac medications before your next appointment, please call your pharmacy*  Testing/Procedures: Your physician has requested that you have an exercise stress myoview. For further information please visit [email or web address]. Please follow instruction sheet, as given.  ZIO XT- Long Term Monitor Instructions   Your physician has requested you wear a ZIO patch monitor for _7_ days.  This is a single patch monitor.   [employer] supplies one patch monitor per enrollment. Additional stickers are not available. Please do not apply patch if you will be having a Nuclear Stress Test, Echocardiogram, Cardiac CT, MRI, or Chest Xray during the period you would be wearing the monitor. The patch cannot be worn during these tests. You cannot remove and re-apply the ZIO XT patch monitor.  Your ZIO patch monitor will be sent Fed Ex from [employer] directly to your home address. It may take 3-5 days to receive your monitor after you have been enrolled.  Once you have received your monitor, please review the enclosed instructions. Your monitor has already been registered assigning a specific monitor serial # to you.  Billing and Patient Assistance Program Information   We have supplied [employer] with any of your insurance information on file for billing purposes. [employer] offers a sliding scale Patient Assistance Program for patients that do not have insurance, or whose insurance does not completely cover the cost of the ZIO monitor.   You must apply for the Patient Assistance Program to qualify for this discounted rate.     To apply, please call [employer] at [phone], select option 4, then select option 2, and ask to apply for Patient Assistance Program.  Theodore Demark will ask your household income, and how many  people are in your household.  They will quote your out-of-pocket cost based on that information.  [employer] will also be able to set up a 78-month interest-free payment plan if needed.  Applying the monitor   Shave hair from upper left chest.  Hold abrader disc by orange tab. Rub abrader in 40 strokes over the upper left chest as indicated in your monitor instructions.  Clean area with 4 enclosed alcohol pads. Let dry.  Apply patch as indicated in monitor instructions. Patch will be placed under collarbone on left side of chest with arrow pointing upward.  Rub patch adhesive wings for 2 minutes. Remove white label marked "1". Remove the white label marked "2". Rub patch adhesive wings for 2 additional minutes.  While looking in a mirror, press and release button in center of patch. A small green light will flash 3-4 times. This will be your only indicator that the monitor has been turned on. ?  Do not shower for the first 24 hours. You may shower after the first 24 hours.  Press the button if you feel a symptom. You will hear a small click. Record Date, Time and Symptom in the Patient Logbook.  When you are ready to remove the patch, follow instructions on the last 2 pages of the Patient Logbook. Stick patch monitor onto the last page of Patient Logbook.  Place Patient Logbook in the blue and white box.  Use locking tab on box and tape box closed securely.  The blue and white box has prepaid postage on it. Please place it in the mailbox as soon as possible. Your  physician should have your test results approximately 7 days after the monitor has been mailed back to [facility].  Call [place] at [phone] if you have questions regarding your ZIO XT patch monitor. Call them immediately if you see an orange light blinking on your monitor.  If your monitor falls off in less than 4 days, contact our Monitor department at [phone]. ?If your monitor becomes loose or falls off  after 4 days call [employer] at [phone] for suggestions on securing your monitor.?   Follow-Up: At [facility], you and your health needs are our priority.  As part of our continuing mission to provide you with exceptional heart care, we have created designated Provider Care Teams.  These Care Teams include your primary Cardiologist (physician) and Advanced Practice Providers (APPs -  Physician Assistants and Nurse Practitioners) who all work together to provide you with the care you need, when you need it.  We recommend signing up for the patient portal called "MyChart".  Sign up information is provided on this After Visit Summary.  MyChart is used to connect with patients for Virtual Visits (Telemedicine).  Patients are able to view lab/test results, encounter notes, upcoming appointments, etc.  Non-urgent messages can be sent to your provider as well.   To learn more about what you can do with MyChart, go to [email or web address].    Your next appointment:   After testing  The format for your next appointment:   In Person  Provider:   Glenetta Hew, MD or APP

## 2021-12-04 NOTE — Progress Notes (Deleted)
GI Office Note    Referring Provider: [facility] Primary Care Physician:  [facility] Primary Gastroenterologist: previously Dr. Laural Golden  Date:  12/04/2021  ID:  Blake Solis, DOB 12/19/1955, MRN [id number]   Chief Complaint   No chief complaint on file.   History of Present Illness  Blake Solis is a 66 y.o. 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  Last colonoscopy June 2021: 3 sessile polyps found in the splenic flexure and ascending colon, 10 mm polyp in the transverse colon, few diverticula found in the sigmoid colon.  Pathology revealed tubular adenomas.  Advised to repeat in 5 years.  Abdominal ultrasound September 2021 due to hepatomegaly on physical exam: 7 mm presumed gallbladder polyp, no evidence of cholelithiasis or cholecystitis.  CBD measuring 5 mm, no intrahepatic or extrahepatic biliary ductal dilation.  No hepatosplenomegaly.  Visualized portions of pancreas appeared normal, absent left kidney.  Recommended follow-up ultrasound of the gallbladder in 1 year.  CT abdomen pelvis September 2022: Generalized left scrotal skin thickening and subcutaneous collection in the upper left scrotum, no acute abnormality in the abdomen or pelvis, mild sigmoid diverticulosis without diverticulitis.  Today:   Current Outpatient Medications  Medication Sig Dispense Refill   amLODipine (NORVASC) 10 MG tablet Take 10 mg by mouth daily.     losartan (COZAAR) 100 MG tablet Take 100 mg by mouth daily.     No current facility-administered medications for this visit.    Past Medical History:  Diagnosis Date   Arthritis    Bronchitis, allergic    Essential hypertension    On several medications.   Hyperlipidemia due to dietary fat intake    Hypertension    Obesity (BMI 35.0-39.9 without comorbidity) 04/29/2020    BMI 35.8   Solitary kidney, acquired    Status post left nephrectomy-was a kidney donor for his brother.   Tick bite 01/26/2020    Past Surgical History:  Procedure Laterality Date   CARDIAC CATHETERIZATION  04/04/2002   Images reviewed: Normal coronaries   COLONOSCOPY N/A 09/06/2019   Procedure: COLONOSCOPY;  Surgeon: Rogene Houston, MD;  [facility];  Service: Endoscopy;  Laterality: N/A;  135   CYST EXCISION Right    cyst removed from arm   ELBOW SURGERY Left    HIP ARTHROPLASTY Right    KIDNEY DONATION Left    Was donor for his brother   KNEE ARTHROSCOPY Right 2013   lft ulnar nerve removed     decompression   MASS EXCISION Left 09/14/2021   Procedure: EXCISION SOFT TISSUE MASS LEFT FOOT;  Surgeon: Jobe Igo, DPM;  Location: [place];  Service: Podiatry;  Laterality: Left;  MAC WITH LOCAL   NEPHRECTOMY Left    left, donated to his brother, EF 55-60%...   POLYPECTOMY  09/06/2019   Procedure: POLYPECTOMY;  Surgeon: Rogene Houston, MD;  [facility];  Service: Endoscopy;;   SCROTAL EXPLORATION N/A 01/01/2021   Procedure: SCROTUM EXPLORATION- excision of scrotal sebacous cyst;  Surgeon: Cleon Gustin, MD;  [facility];  Service: Urology;  Laterality: N/A;   SHOULDER ARTHROSCOPY WITH ROTATOR CUFF REPAIR AND SUBACROMIAL DECOMPRESSION Right 10/14/2017   Procedure: RIGHT SHOULDER ARTHROSCOPY WITH EXTENSIVE DEBRIDEMENT, SUBACROMIAL DECOMPRESSION, DISTAL CLAVICLE EXCISION AND BICEPS TENODYSIS;  Surgeon: Leandrew Koyanagi, MD;  Location: [facility];  Service: Orthopedics;  Laterality: Right;   TOTAL HIP ARTHROPLASTY Right 06/07/2012   Procedure: TOTAL HIP ARTHROPLASTY ANTERIOR APPROACH;  Surgeon: Marybelle Killings, MD;  Location: [place];  Service: Orthopedics;  Laterality: Right;  Right Total Hip Arthroplasty-Anterior Approach   TRANSTHORACIC ECHOCARDIOGRAM  04/28/2020   [facility]) EF 65 to 70%.  No or WMA.  GR 1  DD.  Mildly thickened aortic valve.  Mild to moderately dilated left atrium.  Normal RV size and function.  Mild RA dilation.:   ULNAR NERVE TRANSPOSITION Left 11/13/2020   Procedure: left elbow ulnar nerve neurolysis, ganglion cyst removal;  Surgeon: Leandrew Koyanagi, MD;  Location: [place];  Service: Orthopedics;  Laterality: Left;    Family History  Problem Relation Age of Onset   Colon cancer Brother 26   Neurologic Disorder Mother        [name] - by report   Liver disease Father    Other Sister 76       Natural causes   Pancreatic cancer Brother 7       Unknown   Diabetes Mellitus II Brother        Died from DM-Coma @ 67   Diabetic kidney disease Brother 24       End-stage- s/p Txplant ([facility] was the Donor)   Lung cancer Brother 82   Other Sister 50   Healthy Sister        He does not know much details   Arthritis Sister    Diabetes type II Sister    Arthritis Sister    Stomach cancer Sister     Allergies as of 12/07/2021   (No Known Allergies)    Social History   Socioeconomic History   Marital status: Married    Spouse name: Not on file   Number of children: 1   Years of education: Not on file   Highest education level: GED or equivalent  Occupational History   Occupation: Field seismologist: [employer]: Now called [employer]  Tobacco Use   Smoking status: Former    Packs/day: 1.00    Years: 40.00    Total pack years: 40.00    Types: Cigarettes    Quit date: 04/22/1998    Years since quitting: 23.6   Smokeless tobacco: Never  Vaping Use   Vaping Use: Never used  Substance and Sexual Activity   Alcohol use: No   Drug use: No   Sexual activity: Yes  Other Topics Concern   Not on file  Social History Narrative   He is a married father of 1 daughter, grandfather of 1.     His daughter Blake Solis and grandson lives with him and his wife.      He currently works for [employer] as a Merchant navy officer.    Former smoker about 40 pack years, quit in 2000      Does lots of exercise and physical activity and work, but does not do routine exercise otherwise.       Social Determinants of Health   Financial Resource Strain: Not on file  Food Insecurity: Not on file  Transportation Needs: Not on file  Physical Activity: Not on file  Stress: Not on file  Social Connections: Not on file     Review of Systems   Gen: Denies fever, chills, anorexia. Denies fatigue, weakness, weight loss.  CV: Denies chest pain, palpitations, syncope, peripheral edema,  and claudication. Resp: Denies dyspnea at rest, cough, wheezing, coughing up blood, and pleurisy. GI: See HPI Derm: Denies rash, itching, dry skin Psych: Denies depression, anxiety, memory loss, confusion. No homicidal or suicidal ideation.  Heme: Denies bruising, bleeding, and enlarged lymph nodes.   Physical Exam   There were no vitals taken for this visit.  General:   Alert and oriented. No distress noted. Pleasant and cooperative.  Head:  Normocephalic and atraumatic. Eyes:  Conjuctiva clear without scleral icterus. Mouth:  Oral mucosa pink and moist. Good dentition. No lesions. Lungs:  Clear to auscultation bilaterally. No wheezes, rales, or rhonchi. No distress.  Heart:  S1, S2 present without murmurs appreciated.  Abdomen:  +BS, soft, non-tender and non-distended. No rebound or guarding. No HSM or masses noted. Rectal: *** Msk:  Symmetrical without gross deformities. Normal posture. Extremities:  Without edema. Neurologic:  Alert and  oriented x4 Psych:  Alert and cooperative. Normal mood and affect.   Assessment  Blake Solis is a 66 y.o. 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  Gallbladder polyp: Found on ultrasound in September 2021 that was performed due to reported hepatomegaly on exam.  Ultrasound revealed 7 mm  lesion in the gallbladder, presumed to be a polyp.  No evidence of cholelithiasis.  1 year follow-up with ultrasound recommended, currently overdue.  PLAN   *** Abdominal ultrasound Recall for surveillance colonoscopy in 2026.      Venetia Night, MSN, FNP-BC, AGACNP-BC [facility]

## 2021-12-04 NOTE — Progress Notes (Unsigned)
7 day ZIO XT mailed to pt's home address.

## 2021-12-04 NOTE — Assessment & Plan Note (Signed)
Fatigue and dyspnea associated with activity in the setting of bradycardia.  Need to see what his overall heart rate was is.  We will do a 7-day patch but also have him do a treadmill portion of a treadmill Myoview in order to get assess for chronotropic competence. The imaging portion will also help assess for ischemia.  If he is converted to [place], we still need to evaluate the treadmill portion for exercise tolerance, and chronotropic response to exercise.Blake Solis

## 2021-12-04 NOTE — Progress Notes (Signed)
Primary Care Provider: Practice, [place] Cardiologist: Glenetta Hew, MD Electrophysiologist: None  Clinic Note: Chief Complaint  Patient presents with   Follow-up    Noting bradycardia and fatigue.  Also some chest pain   Bradycardia   Fatigue   ===================================  ASSESSMENT/PLAN   Problem List Items Addressed This Visit       Cardiology Problems   Essential hypertension (Chronic)    Blood pressure looks great on ARB and calcium channel blocker.  Pretty much at max dose of both of these. No beta-blocker because of bradycardia.      Relevant Orders   EKG 12-Lead   Cardiac Stress Test: Informed Consent Details: Physician/Practitioner Attestation; Transcribe to consent form and obtain patient signature   Cardiac Stress Test: Informed Consent Details: Physician/Practitioner Attestation; Transcribe to consent form and obtain patient signature   Hyperlipidemia due to dietary fat intake (Chronic)    With concerns of potential cardiac etiology, would target LDL least less than 100 but more likely less than 70.  Last LDL was 105.  He can evaluate his bradycardia, fatigue and chest pain first with monitor and treadmill Myoview.  If the Myoview was nonischemic, then we may want to risk stratify with a coronary calcium score.  Suspect it would beneficial to initiate therapy for hyperlipidemia.      Relevant Orders   EKG 12-Lead   Cardiac Stress Test: Informed Consent Details: Physician/Practitioner Attestation; Transcribe to consent form and obtain patient signature   Cardiac Stress Test: Informed Consent Details: Physician/Practitioner Attestation; Transcribe to consent form and obtain patient signature     Other   Chest pain with high risk for cardiac etiology (Chronic)    Chest pain that happens with vigorous exertion sounds if it is anginal in nature and would be class II angina however, this is not a new symptom, but it is  definitely exertional and not at rest.  We had planned a stress test last office on but he never fell through with it.  At this time I think I would like to see his exercise tolerance and and heart rate responsiveness to exercise so would like to do a treadmill portion of the Myoview.  If he is not able to complete target heart rate I would asked that they at least record his heart rate responsiveness and monitor EKG prior to her being converted to [place].  He is on amlodipine which is intentional as well as losartan for reduction.      Relevant Orders   EKG 12-Lead   Cardiac Stress Test: Informed Consent Details: Physician/Practitioner Attestation; Transcribe to consent form and obtain patient signature   MYOCARDIAL PERFUSION IMAGING   Cardiac Stress Test: Informed Consent Details: Physician/Practitioner Attestation; Transcribe to consent form and obtain patient signature   Obesity (BMI 35.0-39.9 without comorbidity) (Chronic)   Relevant Orders   EKG 12-Lead   Cardiac Stress Test: Informed Consent Details: Physician/Practitioner Attestation; Transcribe to consent form and obtain patient signature   Cardiac Stress Test: Informed Consent Details: Physician/Practitioner Attestation; Transcribe to consent form and obtain patient signature   Symptomatic bradycardia - Primary    Fatigue and dyspnea associated with activity in the setting of bradycardia.  Need to see what his overall heart rate was is.  We will do a 7-day patch but also have him do a treadmill portion of a treadmill Myoview in order to get assess for chronotropic competence. The imaging portion will also help assess for ischemia.  If he  is converted to [place], we still need to evaluate the treadmill portion for exercise tolerance, and chronotropic response to exercise..      Relevant Orders   LONG TERM MONITOR (3-14 DAYS)   Cardiac Stress Test: Informed Consent Details: Physician/Practitioner Attestation; Transcribe to  consent form and obtain patient signature   ===================================  HPI:    Blake Solis is a 66 y.o. 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  Main indication for the reconsult is bradycardia. He follows up at the request of Practice, [facility]  JALASTAIR HENNESwas last seen on February 2022 Restarted amlodipine and losartan [place] metoprolol.  Plan was for 2-week follow-up.  Recent Hospitalizations:  ER visit 09/14/2021: Left foot mass.  Reviewed  CV studies:    The following studies were reviewed today: (if available, images/films reviewed: From Epic Chart or Care Everywhere) No new studies:  Interval History:   JIRELAND CHAGNONreturns today a year and a half after I saw him the first time noting that the main concern that his PCP had is that his heart rate is slow.  He states that sometimes he gets down into the 40s and he just has no energy whatsoever.  He just feels tired when that happens.  Maybe will short of breath when he does things.  He also notes just feeling weak and dizzy  He mostly notes just feeling fatigued t that is more persistent than any any chest discomfort.  He says he gets chest discomfort if he "goes full force ".  This would mean may be after mowing a lot a large amount of the lawn in the heat, heat may be feel some chest discomfort for little while after it is complete.  It may last up to 30 minutes or so.  It seems to be localized on the right side of the chest and may or may not be associate with chest pain.  He can also note this sometimes if he is climbing up stairs or carrying something upstairs.  Usually the symptoms do abate with relaxing. He can also note some exertional dyspnea but no resting dyspnea.  No PND, orthopnea but does have some mild end of day edema. No syncope or near STEMI,  no TIA or amaurosis fugax.  No claudication.  He is not overly active with much in the way of exercise.  He does not do a whole lot of exertion but he does enjoy mowing the lawn and he still works puts in a lot of effort or at work.  REVIEWED OF SYSTEMS   Review of Systems  Constitutional:  Positive for malaise/fatigue. Negative for weight loss.  HENT:  Negative for ear discharge and nosebleeds.   Respiratory:  Positive for shortness of breath (With exertion).   Gastrointestinal:  Negative for blood in stool and melena.  Genitourinary:  Negative for hematuria.  Musculoskeletal:  Positive for joint pain. Negative for falls.  Neurological:  Positive for dizziness and weakness (Generalized). Negative for focal weakness.  Psychiatric/Behavioral:  Negative for depression and memory loss. The patient does not have insomnia.   All other systems reviewed and are negative. --Pertinent positives noted above.  I have reviewed and (if needed) personally updated the patient's problem list, medications, allergies, past medical and surgical history, social and family history.   PAST MEDICAL HISTORY   Past  Medical History:  Diagnosis Date   Arthritis    Bronchitis, allergic    Essential hypertension    On several medications.   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   Tick bite 01/26/2020    PAST SURGICAL HISTORY   Past Surgical History:  Procedure Laterality Date   CARDIAC CATHETERIZATION  04/04/2002   Images reviewed: Normal coronaries   COLONOSCOPY N/A 09/06/2019   Procedure: COLONOSCOPY;  Surgeon: Rogene Houston, MD;  [facility];  Service: Endoscopy;  Laterality: N/A;  135   CYST EXCISION Right    cyst removed from arm   ELBOW SURGERY Left    HIP ARTHROPLASTY Right    KIDNEY DONATION  Left    Was donor for his brother   KNEE ARTHROSCOPY Right 2013   lft ulnar nerve removed     decompression   MASS EXCISION Left 09/14/2021   Procedure: EXCISION SOFT TISSUE MASS LEFT FOOT;  Surgeon: Jobe Igo, DPM;  Location: [place];  Service: Podiatry;  Laterality: Left;  MAC WITH LOCAL   NEPHRECTOMY Left    left, donated to his brother, EF 55-60%...   POLYPECTOMY  09/06/2019   Procedure: POLYPECTOMY;  Surgeon: Rogene Houston, MD;  [facility];  Service: Endoscopy;;   SCROTAL EXPLORATION N/A 01/01/2021   Procedure: SCROTUM EXPLORATION- excision of scrotal sebacous cyst;  Surgeon: Cleon Gustin, MD;  Location: [facility];  Service: Urology;  Laterality: N/A;   SHOULDER ARTHROSCOPY WITH ROTATOR CUFF REPAIR AND SUBACROMIAL DECOMPRESSION Right 10/14/2017   Procedure: RIGHT SHOULDER ARTHROSCOPY WITH EXTENSIVE DEBRIDEMENT, SUBACROMIAL DECOMPRESSION, DISTAL CLAVICLE EXCISION AND BICEPS TENODYSIS;  Surgeon: Leandrew Koyanagi, MD;  Location: [place];  Service: Orthopedics;  Laterality: Right;   TOTAL HIP ARTHROPLASTY Right 06/07/2012   Procedure: TOTAL HIP ARTHROPLASTY ANTERIOR APPROACH;  Surgeon: Marybelle Killings, MD;  Location: [place];  Service: Orthopedics;  Laterality: Right;  Right Total Hip Arthroplasty-Anterior Approach   TRANSTHORACIC ECHOCARDIOGRAM  04/28/2020   [facility]) EF 65 to 70%.  No or WMA.  GR 1 DD.  Mildly thickened aortic valve.  Mild to moderately dilated left atrium.  Normal RV size and function.  Mild RA dilation.:   ULNAR NERVE TRANSPOSITION Left 11/13/2020   Procedure: left elbow ulnar nerve neurolysis, ganglion cyst removal;  Surgeon: Leandrew Koyanagi, MD;  Location: [place];  Service: Orthopedics;  Laterality: Left;    Immunization History  Administered Date(s) Administered   Influenza Split 06/20/2010, 06/09/2012   Influenza-Unspecified 12/25/2018   Tdap 02/10/2017    MEDICATIONS/ALLERGIES    Current Meds  Medication Sig   amLODipine (NORVASC) 10 MG tablet Take 10 mg by mouth daily.   losartan (COZAAR) 100 MG tablet Take 100 mg by mouth daily.  Uses as needed Tylenol.  No Known Allergies  SOCIAL HISTORY/FAMILY HISTORY   Reviewed in Epic:  Pertinent findings:  Social History   Tobacco Use   Smoking status: Former    Packs/day: 1.00    Years: 40.00    Total pack years: 40.00    Types: Cigarettes    Quit date: 04/22/1998    Years since quitting: 23.6   Smokeless tobacco: Never  Vaping Use   Vaping Use: Never used  Substance Use Topics  Alcohol use: No   Drug use: No   Social History   Social History Narrative   He is a married father of 1 daughter, grandfather of 1.     His daughter [name] and grandson lives with him and his wife.      He currently works for [employer] as a Merchant navy officer.   Former smoker about 40 pack years, quit in 2000      Does lots of exercise and physical activity and work, but does not do routine exercise otherwise.        OBJCTIVE -PE, EKG, labs   Wt Readings from Last 3 Encounters:  12/04/21 237 lb 9.6 oz (107.8 kg)  09/14/21 234 lb 4.8 oz (106.3 kg)  01/16/21 240 lb (108.9 kg)    Physical Exam: BP 125/60   Pulse (!) 52   Ht _0  (1.778 m)   Wt 237 lb 9.6 oz (107.8 kg)   SpO2 97%   BMI 34.09 kg/m  Physical Exam Vitals reviewed.  Constitutional:      Appearance: Normal appearance. He is obese. He is not ill-appearing or toxic-appearing.  HENT:     Head: Normocephalic and atraumatic.  Neck:     Vascular: No carotid bruit.  Cardiovascular:     Rate and Rhythm: Regular rhythm. Bradycardia present.     Pulses: Normal pulses.     Heart sounds: Murmur (Harsh 1/6 SEM at RUSB.) heard.     No friction rub. No gallop.  Pulmonary:     Effort: Pulmonary effort is normal. No respiratory distress.     Breath sounds: Normal breath sounds. No wheezing, rhonchi or rales.  Chest:     Chest wall: No tenderness.   Abdominal:     General: Abdomen is flat. Bowel sounds are normal. There is no distension.     Palpations: Abdomen is soft. There is no mass.     Tenderness: There is no abdominal tenderness.     Comments: Truncal obesity but otherwise soft/NT/ND/NABS.  No HSM.  Musculoskeletal:        General: No swelling. Normal range of motion.     Cervical back: Normal range of motion and neck supple.  Skin:    General: Skin is warm and dry.  Neurological:     General: No focal deficit present.     Mental Status: He is alert and oriented to person, place, and time.  Psychiatric:        Mood and Affect: Mood normal.        Behavior: Behavior normal.        Thought Content: Thought content normal.        Judgment: Judgment normal.     Adult ECG Report  Rate: 52;  Rhythm: sinus bradycardia; Otherwise normal axis, intervals and durations.  Narrative Interpretation: Otherwise normal.  Recent Labs:   Last CMP 11/23/2020:  Contains abnormal data Comprehensive Metabolic Panel Order: [id number]  Ref Range & Units 1 yr ago  Sodium 135 - 145 mmol/L 137   Potassium 3.5 - 5.0 mmol/L 4.4   Chloride 98 - 107 mmol/L 103   CO2 21.0 - 32.0 mmol/L 25.2   Anion Gap 3 - 11 mmol/L 9   BUN 8 - 20 mg/dL 16   Creatinine 0.80 - 1.30 mg/dL 1.47 High    BUN/Creatinine Ratio  11   eGFR CKD-EPI (2021) Male >=60 mL/min/1.47m 53 Low    Glucose 70 - 179 mg/dL 103   Calcium 8.5 - 10.1  mg/dL 10.1   Albumin 3.5 - 5.0 g/dL 3.9   Total Protein 6.0 - 8.0 g/dL 7.7   Total Bilirubin 0.3 - 1.2 mg/dL 0.4   AST 15 - 40 U/L 25   ALT 12 - 78 U/L 21   Alkaline Phosphatase 46 - 116 U/L 134 High     Lab Results  Component Value Date   CHOL 169 05/02/2020   HDL 45 05/02/2020   LDLCALC 105 (H) 05/02/2020   TRIG 105 05/02/2020   CHOLHDL 3.8 05/02/2020   Lab Results  Component Value Date   CREATININE 1.26 (H) 09/02/2016   BUN 15 09/02/2016   NA 136 09/02/2016   K 4.0 09/02/2016   CL 99 (L) 09/02/2016   CO2 24 09/02/2016       Latest Ref Rng & Units 09/02/2016    2:10 PM 06/09/2012    7:00 AM 06/08/2012    6:20 AM  CBC  WBC 4.0 - 10.5 K/uL 6.2  9.5  7.8   Hemoglobin 13.0 - 17.0 g/dL 15.3  12.3  14.0   Hematocrit 39.0 - 52.0 % 45.0  34.7  39.9   Platelets 150 - 400 K/uL 136  171  PLATELET CLUMPS NOTED ON SMEAR, UNABLE TO ESTIMATE     No results found for: "HGBA1C" No results found for: "TSH"  ================================================== I spent a total of 19 minutes with the patient spent in direct patient consultation.  Additional time spent with chart review  / charting (studies, outside notes, etc): 30 min Total Time: 49 min  Current medicines are reviewed at length with the patient today.  (+/- concerns) N/A  Notice: This dictation was prepared with Dragon dictation along with smart phrase technology. Any transcriptional errors that result from this process are unintentional and may not be corrected upon review.  Studies Ordered:   Orders Placed This Encounter  Procedures   Cardiac Stress Test: Informed Consent Details: Physician/Practitioner Attestation; Transcribe to consent form and obtain patient signature   Cardiac Stress Test: Informed Consent Details: Physician/Practitioner Attestation; Transcribe to consent form and obtain patient signature   MYOCARDIAL PERFUSION IMAGING   LONG TERM MONITOR (3-14 DAYS)   EKG 12-Lead   No orders of the defined types were placed in this encounter.  Shared Decision Making/Informed Consent{ The risks [chest pain, shortness of breath, cardiac arrhythmias, dizziness, blood pressure fluctuations, myocardial infarction, stroke/transient ischemic attack, nausea, vomiting, allergic reaction, radiation exposure, metallic taste sensation and life-threatening complications (estimated to be 1 in 10,000)], benefits (risk stratification, diagnosing coronary artery disease, treatment guidance) and alternatives of a nuclear stress test were discussed in detail with  Mr. Rieman and he agrees to proceed.   Patient Instructions / Medication Changes & Studies & Tests Ordered   Patient Instructions  Medication Instructions:  Your physician recommends that you continue on your current medications as directed. Please refer to the Current Medication list given to you today.  *If you need a refill on your cardiac medications before your next appointment, please call your pharmacy*  Testing/Procedures: Your physician has requested that you have an exercise stress myoview. For further information please visit [email or web address]. Please follow instruction sheet, as given.  ZIO XT- Long Term Monitor Instructions   Your physician has requested you wear a ZIO patch monitor for _7_ days.  This is a single patch monitor.   [employer] supplies one patch monitor per enrollment. Additional stickers are not available. Please do not apply patch if you will be having a Nuclear Stress  Test, Echocardiogram, Cardiac CT, MRI, or Chest Xray during the period you would be wearing the monitor. The patch cannot be worn during these tests. You cannot remove and re-apply the ZIO XT patch monitor.  Your ZIO patch monitor will be sent Fed Ex from [employer] directly to your home address. It may take 3-5 days to receive your monitor after you have been enrolled.  Once you have received your monitor, please review the enclosed instructions. Your monitor has already been registered assigning a specific monitor serial # to you.  Billing and Patient Assistance Program Information   We have supplied [employer] with any of your insurance information on file for billing purposes. [employer] offers a sliding scale Patient Assistance Program for patients that do not have insurance, or whose insurance does not completely cover the cost of the ZIO monitor.   You must apply for the Patient Assistance Program to qualify for this discounted rate.     To apply, please call [employer] at [phone],  select option 4, then select option 2, and ask to apply for Patient Assistance Program.  Theodore Demark will ask your household income, and how many people are in your household.  They will quote your out-of-pocket cost based on that information.  [employer] will also be able to set up a 42-month interest-free payment plan if needed.  Applying the monitor   Shave hair from upper left chest.  Hold abrader disc by orange tab. Rub abrader in 40 strokes over the upper left chest as indicated in your monitor instructions.  Clean area with 4 enclosed alcohol pads. Let dry.  Apply patch as indicated in monitor instructions. Patch will be placed under collarbone on left side of chest with arrow pointing upward.  Rub patch adhesive wings for 2 minutes. Remove white label marked "1". Remove the white label marked "2". Rub patch adhesive wings for 2 additional minutes.  While looking in a mirror, press and release button in center of patch. A small green light will flash 3-4 times. This will be your only indicator that the monitor has been turned on. ?  Do not shower for the first 24 hours. You may shower after the first 24 hours.  Press the button if you feel a symptom. You will hear a small click. Record Date, Time and Symptom in the Patient Logbook.  When you are ready to remove the patch, follow instructions on the last 2 pages of the Patient Logbook. Stick patch monitor onto the last page of Patient Logbook.  Place Patient Logbook in the blue and white box.  Use locking tab on box and tape box closed securely.  The blue and white box has prepaid postage on it. Please place it in the mailbox as soon as possible. Your physician should have your test results approximately 7 days after the monitor has been [facility]  Call [place]at [phone] you have questions regarding your ZIO XT patch monitor. Call them immediately if you see an orange light blinking on your monitor.   If your monitor falls off in less than 4 days, contact our Monitor department at [phone] ?If your monitor becomes loose or falls off after 4 days call [employer] at [phone] suggestions on securing your monitor.?   Follow-Up: [facility] and your [facility] needs are our priority.  As part of our continuing mission to provide you with exceptional heart care, we have created designated Provider Care Teams.  These Care Teams include your primary Cardiologist (physician) and Advanced Practice Providers (APPs -  Physician Assistants and Nurse Practitioners) who all work together to provide you with the care you need, when you need it.  We recommend signing up for the patient portal called "MyChart".  Sign up information is provided on this After Visit Summary.  MyChart is used to connect with patients for Virtual Visits (Telemedicine).  Patients are able to view lab/test results, encounter notes, upcoming appointments, etc.  Non-urgent messages can be sent to your provider as well.   To learn more about what you can do with MyChart, go to [email or web address].    Your next appointment:   After testing  The format for your next appointment:   In Person  Provider:   Glenetta Hew, MD or APP         Leonie Man, MD, MS Glenetta Hew, M.D., M.S. Interventional Cardiologist  [place]  Pager # [phone] Phone # [phone] [address] [place], [place] [address]   Thank you for choosing [facility]!!

## 2021-12-04 NOTE — Assessment & Plan Note (Signed)
With concerns of potential cardiac etiology, would target LDL least less than 100 but more likely less than 70.  Last LDL was 105.  He can evaluate his bradycardia, fatigue and chest pain first with monitor and treadmill Myoview.  If the Myoview was nonischemic, then we may want to risk stratify with a coronary calcium score.  Suspect it would beneficial to initiate therapy for hyperlipidemia.

## 2021-12-07 ENCOUNTER — Encounter: Payer: Self-pay | Admitting: Gastroenterology

## 2021-12-07 ENCOUNTER — Ambulatory Visit: Payer: BC Managed Care – PPO | Admitting: Gastroenterology

## 2021-12-07 ENCOUNTER — Ambulatory Visit (INDEPENDENT_AMBULATORY_CARE_PROVIDER_SITE_OTHER): Payer: BC Managed Care – PPO | Admitting: Gastroenterology

## 2021-12-07 VITALS — BP 143/80 | HR 52 | Temp 97.2°F | Ht 70.0 in | Wt 238.0 lb

## 2021-12-07 DIAGNOSIS — K219 Gastro-esophageal reflux disease without esophagitis: Secondary | ICD-10-CM | POA: Diagnosis not present

## 2021-12-07 DIAGNOSIS — K824 Cholesterolosis of gallbladder: Secondary | ICD-10-CM

## 2021-12-07 DIAGNOSIS — K5900 Constipation, unspecified: Secondary | ICD-10-CM

## 2021-12-07 MED ORDER — PANTOPRAZOLE SODIUM 40 MG PO TBEC: 40.0000 mg | DELAYED_RELEASE_TABLET | Freq: Every day | ORAL | 3 refills | Status: DC

## 2021-12-07 NOTE — Patient Instructions (Addendum)
For your reflux/indigestion: I have sent in pantoprazole 40 mg for you to take daily.  Please take this 30 minutes prior to breakfast or dinner based on your work schedule. Follow a GERD diet:  Avoid fried, fatty, greasy, spicy, citrus foods. Avoid caffeine and carbonated beverages. Avoid chocolate. Try eating 4-6 small meals a day rather than 3 large meals. Do not eat within 3 hours of laying down. Prop head of bed up on wood or bricks to create a 6 inch incline.  For your constipation: 1-2 begin taking MiraLAX 17 g (1 capful) daily or every other day as needed.  This should help loosen up your stools and make it easier for you to go. Also increasing fiber in your diet and your intake of water should also help.  Regarding your gallbladder polyp: Your most recent ultrasound is reassuring, the gallbladder polyp that was seen in 2021 has decreased in size.  Given your age, the guidelines recommend surveillance abdominal ultrasounds annually.  We will place you on recall to obtain an abdominal ultrasound in 1 year.  It was a pleasure to see you today. I want to create trusting relationships with patients. If you receive a survey regarding your visit,  I greatly appreciate you taking time to fill this out on paper or through your MyChart. I value your feedback.  Venetia Night, MSN, FNP-BC, AGACNP-BC [facility]

## 2021-12-07 NOTE — Progress Notes (Signed)
GI Office Note    Referring Provider: Practice, [place]* Primary Care Physician:  P[facility], [facility] Gastroenterologist: Dr. Gala Romney  Date:  12/07/2021  ID:  Blake Solis, DOB 13-Oct-1955, MRN [id number]   Chief Complaint   Chief Complaint  Patient presents with   New Patient (Initial Visit)    Pt needs follow up for gall bladder polyp to make sure it is not cancer    History of Present Illness  Blake Solis is a 66 y.o. 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   Last colonoscopy June 2021: 3 sessile polyps found in the splenic flexure and ascending colon, 10 mm polyp in the transverse colon, few diverticula found in the sigmoid colon.  Pathology revealed tubular adenomas.  Advised to repeat in 5 years.   Abdominal ultrasound September 2021 due to hepatomegaly on physical exam: 7 mm presumed gallbladder polyp, no evidence of cholelithiasis or cholecystitis.  CBD measuring 5 mm, no intrahepatic or extrahepatic biliary ductal dilation.  No hepatosplenomegaly.  Visualized portions of pancreas appeared normal, absent left kidney.  Recommended follow-up ultrasound of the gallbladder in 1 year.   CT abdomen pelvis September 2022: Generalized left scrotal skin thickening and subcutaneous collection in the upper left scrotum, no acute abnormality in the abdomen or pelvis, mild sigmoid diverticulosis without diverticulitis.  Per review of records faxed by PCP: Recent abdominal ultrasound completed 09/10/2021 noting diffuse fatty changes of the liver without biliary ductal dilation.  3 mm nonshadowing echogenicity in the anterior gallbladder wall consistent with history of gallbladder polyp, 1.2 cm density present in the gallbladder indicative of gallstone, no evidence of cholecystitis.  CMP performed 09/10/2021 also with normal LFTs, alk  phos, and T. bili.  Hemoglobin stable.   Today:  Has lower right sided abdominal pain that occurs every couple of days. Does have some constipation. Has bowel movements usually every other day and sometimes every 3-4 days. Does have firm stools and does have to strain. Has taken some stool softeners every other day (Colace). Has tried miralax in the past but thinks this may have been helpful. Was taking some Lyrica a few months ago.   Does have some occasional RUQ pain - once or twice per week not related to certain foods. Does have issues with reflux and heartburn. Works night shift so he typically has symptoms when he goes to sleep, sometimes needing to prop himself up. Does occasionally have fired/fatty/greasy foods.  Takes an over the counter antacid but not daily. Works 12 hours night shift and typically only eats prior to going to work on the days that he works. Denies alarm symptoms.     Current Outpatient Medications  Medication Sig Dispense Refill   amLODipine (NORVASC) 10 MG tablet Take 10 mg by mouth daily.     losartan (COZAAR) 100 MG tablet Take 100 mg by mouth daily.     pantoprazole (PROTONIX) 40 MG tablet Take 1 tablet (40 mg total) by mouth daily. 30 tablet 3   No current facility-administered medications for this visit.    Past Medical History:  Diagnosis Date   Arthritis    Bronchitis, allergic    Essential hypertension    On several medications.   Hyperlipidemia due to dietary fat intake    Hypertension    Obesity (BMI 35.0-39.9 without comorbidity) 04/29/2020   BMI 35.8  Solitary kidney, acquired    Status post left nephrectomy-was a kidney donor for his brother.   Tick bite 01/26/2020    Past Surgical History:  Procedure Laterality Date   CARDIAC CATHETERIZATION  04/04/2002   Images reviewed: Normal coronaries   COLONOSCOPY N/A 09/06/2019   Procedure: COLONOSCOPY;  Surgeon: Rogene Houston, MD;  [facility];  Service: Endoscopy;  Laterality:  N/A;  135   CYST EXCISION Right    cyst removed from arm   ELBOW SURGERY Left    HIP ARTHROPLASTY Right    KIDNEY DONATION Left    Was donor for his brother   KNEE ARTHROSCOPY Right 2013   lft ulnar nerve removed     decompression   MASS EXCISION Left 09/14/2021   Procedure: EXCISION SOFT TISSUE MASS LEFT FOOT;  Surgeon: Jobe Igo, DPM;  Location: [place];  Service: Podiatry;  Laterality: Left;  MAC WITH LOCAL   NEPHRECTOMY Left    left, donated to his brother, EF 55-60%...   POLYPECTOMY  09/06/2019   Procedure: POLYPECTOMY;  Surgeon: Rogene Houston, MD;  [facility];  Service: Endoscopy;;   SCROTAL EXPLORATION N/A 01/01/2021   Procedure: SCROTUM EXPLORATION- excision of scrotal sebacous cyst;  Surgeon: Cleon Gustin, MD;  [facility]: [facility];  Service: Urology;  Laterality: N/A;   SHOULDER ARTHROSCOPY WITH ROTATOR CUFF REPAIR AND SUBACROMIAL DECOMPRESSION Right 10/14/2017   Procedure: RIGHT SHOULDER ARTHROSCOPY WITH EXTENSIVE DEBRIDEMENT, SUBACROMIAL DECOMPRESSION, DISTAL CLAVICLE EXCISION AND BICEPS TENODYSIS;  Surgeon: Leandrew Koyanagi, MD;  Location: [place];  Service: Orthopedics;  Laterality: Right;   TOTAL HIP ARTHROPLASTY Right 06/07/2012   Procedure: TOTAL HIP ARTHROPLASTY ANTERIOR APPROACH;  Surgeon: Marybelle Killings, MD;  Location: [place];  Service: Orthopedics;  Laterality: Right;  Right Total Hip Arthroplasty-Anterior Approach   TRANSTHORACIC ECHOCARDIOGRAM  04/28/2020   [facility]) EF 65 to 70%.  No or WMA.  GR 1 DD.  Mildly thickened aortic valve.  Mild to moderately dilated left atrium.  Normal RV size and function.  Mild RA dilation.:   ULNAR NERVE TRANSPOSITION Left 11/13/2020   Procedure: left elbow ulnar nerve neurolysis, ganglion cyst removal;  Surgeon: Leandrew Koyanagi, MD;  Location: [place];  Service: Orthopedics;  Laterality: Left;    Family History  Problem Relation Age of Onset   Colon  cancer Brother 7   Neurologic Disorder Mother        [name] - by report   Liver disease Father    Other Sister 20       Natural causes   Pancreatic cancer Brother 20       Unknown   Diabetes Mellitus II Brother        Died from DM-Coma @ 32   Diabetic kidney disease Brother 26       End-stage- s/p Txplant ([place] was the Donor)   Lung cancer Brother 15   Other Sister 28   Healthy Sister        He does not know much details   Arthritis Sister    Diabetes type II Sister    Arthritis Sister    Stomach cancer Sister     Allergies as of 12/07/2021   (No Known Allergies)    Social History   Socioeconomic History   Marital status: Married    Spouse name: Not on file   Number of children: 1   Years of education: Not on  file   Highest education level: GED or equivalent  Occupational History   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     His daughter [name] and grandson lives with him and his wife.      He currently works for [employer] as a Merchant navy officer.   Former smoker about 40 pack years, quit in 2000      Does lots of exercise and physical activity and work, but does not do routine exercise otherwise.       Social Determinants of Health   Financial Resource Strain: Not on file  Food Insecurity: Not on file  Transportation Needs: Not on file  Physical Activity: Not on file  Stress: Not on file  Social Connections: Not on file     Review of  Systems   Gen: Denies fever, chills, anorexia. Denies fatigue, weakness, weight loss.  CV: Denies chest pain, palpitations, syncope, peripheral edema, and claudication. Resp: Denies dyspnea at rest, cough, wheezing, coughing up blood, and pleurisy. GI: See HPI Derm: Denies rash, itching, dry skin Psych: Denies depression, anxiety, memory loss, confusion. No homicidal or suicidal ideation.  Heme: Denies bruising, bleeding, and enlarged lymph nodes.   Physical Exam   BP (!) 143/80   Pulse (!) 52   Temp (!) 97.2 F (36.2 C)   Ht _0  (1.778 m)   Wt 238 lb (108 kg)   BMI 34.15 kg/m   General:   Alert and oriented. No distress noted. Pleasant and cooperative.  Head:  Normocephalic and atraumatic. Eyes:  Conjuctiva clear without scleral icterus. Lungs:  Clear to auscultation bilaterally. No wheezes, rales, or rhonchi. No distress.  Heart:  S1, S2 present without murmurs appreciated.  Abdomen:  +BS, soft, non-tender and non-distended. No rebound or guarding. No HSM or masses noted. Rectal: deferred Msk:  Symmetrical without gross deformities. Normal posture. Extremities:  Without edema. Neurologic:  Alert and  oriented x4 Psych:  Alert and cooperative. Normal mood and affect.   Assessment  Blake Solis is a 66 y.o. 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   Gallbladder polyp: Found on ultrasound in September 2021 that was performed due to reported hepatomegaly on exam.  Ultrasound revealed 7 mm lesion in the gallbladder, presumed to be a polyp.  No evidence of cholelithiasis.  Recent abdominal ultrasound 09/10/21 noting mild fatty infiltration of the liver and gallbladder polyp about 3 mm in size.  Also noted 1.2 cm gallstone.  Patient does note some occasional right upper quadrant pain that happens about once per twice a week however there was no evidence of cholecystitis or any  gallbladder wall thickening.  Recent CMP with normal LFTs.  Per guidelines given patient's age, yearly abdominal ultrasounds recommended for gallbladder polyps less  than 5 mm in size.  Suspect largely that his upper quadrant pain is probably related to acid reflux.  GERD: Typically with nocturnal type symptoms.  He does work night shift therefore usually on the days that he works he is having symptoms in the mornings when he is laying down.  Occasionally will have to prop himself up.  Has not been using any Tums or Rolaids over-the-counter for relief, but has been taking over-the-counter and acid similar to omeprazole that is sometimes helpful.  We will trial pantoprazole 40 mg daily.  We discussed GERD diet and lifestyle modifications.  Education handout provided as well.  Constipation: He reports some occasional right-sided lower abdominal pain that occurs every couple days.  Reports bowel movements usually every other day, sometimes will be 3-4 days before having a bowel movement.  Stools are reported as firm and does often have to strain. Takes an occasional generic Colace usually about every other day and is somewhat helpful.  Notes he has tried MiraLAX in the past and believes it may have been helpful but is not sure.  He denies any melena, or BRBPR, or unintentional weight loss.  He denies any chronic opioid use.  Did try Lyrica a few months ago.  For now we will trial MiraLAX 17 g daily for more bowel regularity.  I also advised an increase of fiber in his diet and increase water intake.   PLAN   Recall for surveillance colonoscopy in 2026.  Abdominal ultrasound in 1 year.  Pantoprazole 40 mg daily, 30 minutes prior to breakfast.  GERD diet and lifestyle modifications, handout provided Miralax 17g daily.  Follow up in 6 months or sooner if needed.    Venetia Night, MSN, FNP-BC, AGACNP-BC [facility]

## 2021-12-08 ENCOUNTER — Telehealth: Payer: Self-pay | Admitting: Cardiology

## 2021-12-08 DIAGNOSIS — E7849 Other hyperlipidemia: Secondary | ICD-10-CM

## 2021-12-08 DIAGNOSIS — I10 Essential (primary) hypertension: Secondary | ICD-10-CM

## 2021-12-08 DIAGNOSIS — R079 Chest pain, unspecified: Secondary | ICD-10-CM

## 2021-12-08 DIAGNOSIS — E669 Obesity, unspecified: Secondary | ICD-10-CM

## 2021-12-08 DIAGNOSIS — R001 Bradycardia, unspecified: Secondary | ICD-10-CM

## 2021-12-08 NOTE — Telephone Encounter (Signed)
Stress test reordered for Blake Solis Routed to scheduling pool to arrange new appointment

## 2021-12-08 NOTE — Telephone Encounter (Signed)
Pt would like to know if order for Stress Test can be changed so that he can have done at [facility] being that it is closer for him. Please advise

## 2021-12-14 ENCOUNTER — Other Ambulatory Visit: Payer: Self-pay

## 2021-12-14 DIAGNOSIS — R079 Chest pain, unspecified: Secondary | ICD-10-CM

## 2021-12-17 ENCOUNTER — Ambulatory Visit (HOSPITAL_COMMUNITY)
Admission: RE | Admit: 2021-12-17 | Discharge: 2021-12-17 | Disposition: A | Discharge status: Home / Self Care | Payer: BC Managed Care – PPO | Source: Ambulatory Visit | Attending: Cardiology | Admitting: Cardiology

## 2021-12-17 ENCOUNTER — Encounter (HOSPITAL_COMMUNITY)
Admission: RE | Admit: 2021-12-17 | Discharge: 2021-12-17 | Disposition: A | Discharge status: Home / Self Care | Payer: BC Managed Care – PPO | Source: Ambulatory Visit | Attending: Cardiology | Admitting: Cardiology

## 2021-12-17 ENCOUNTER — Ambulatory Visit: Payer: BC Managed Care – PPO | Admitting: Orthopaedic Surgery

## 2021-12-17 ENCOUNTER — Encounter (HOSPITAL_COMMUNITY): Payer: Self-pay

## 2021-12-17 DIAGNOSIS — R079 Chest pain, unspecified: Secondary | ICD-10-CM | POA: Diagnosis present

## 2021-12-17 LAB — NM MYOCAR MULTI W/SPECT W/WALL MOTION / EF
Estimated workload: 3.3
Exercise duration (min): 1 min
Exercise duration (sec): 46 s
LV dias vol: 102 mL (ref 62–150)
LV sys vol: 42 mL
MPHR: 154 {beats}/min
Nuc Stress EF: 58 %
Peak HR: 85 {beats}/min
Percent HR: 55 %
RATE: 0.4
RPE: 13
Rest HR: 53 {beats}/min
Rest Nuclear Isotope Dose: 10.5 mCi
SDS: 1
SRS: 0
SSS: 1
ST Depression (mm): 0 mm
Stress Nuclear Isotope Dose: 32 mCi
TID: 1.03

## 2021-12-17 MED ORDER — REGADENOSON 0.4 MG/5ML IV SOLN
INTRAVENOUS | Status: AC
  Administered 2021-12-17: 0.4 mg
  Filled 2021-12-17: qty 5

## 2021-12-17 MED ORDER — TECHNETIUM TC 99M TETROFOSMIN IV KIT
10.0000 | PACK | Freq: Once | INTRAVENOUS | Status: AC | PRN
  Administered 2021-12-17: 10.5 via INTRAVENOUS

## 2021-12-17 MED ORDER — SODIUM CHLORIDE FLUSH 0.9 % IV SOLN
INTRAVENOUS | Status: AC
  Administered 2021-12-17: 10 mL
  Filled 2021-12-17: qty 10

## 2021-12-17 MED ORDER — TECHNETIUM TC 99M TETROFOSMIN IV KIT
30.0000 | PACK | Freq: Once | INTRAVENOUS | Status: AC | PRN
  Administered 2021-12-17: 32 via INTRAVENOUS

## 2021-12-18 ENCOUNTER — Ambulatory Visit (INDEPENDENT_AMBULATORY_CARE_PROVIDER_SITE_OTHER): Payer: BC Managed Care – PPO | Admitting: Urology

## 2021-12-18 ENCOUNTER — Encounter: Payer: Self-pay | Admitting: Urology

## 2021-12-18 VITALS — BP 156/78 | HR 76

## 2021-12-18 DIAGNOSIS — N401 Enlarged prostate with lower urinary tract symptoms: Secondary | ICD-10-CM

## 2021-12-18 DIAGNOSIS — N138 Other obstructive and reflux uropathy: Secondary | ICD-10-CM

## 2021-12-18 DIAGNOSIS — R351 Nocturia: Secondary | ICD-10-CM | POA: Diagnosis not present

## 2021-12-18 DIAGNOSIS — N281 Cyst of kidney, acquired: Secondary | ICD-10-CM | POA: Diagnosis not present

## 2021-12-18 MED ORDER — SILODOSIN 8 MG PO CAPS: 8.0000 mg | ORAL_CAPSULE | Freq: Every day | ORAL | 11 refills | Status: DC

## 2021-12-18 NOTE — Progress Notes (Unsigned)
12/18/2021 10:53 AM   Blake Solis May 31, 1955 [id number]  Referring provider: Practice, [facility] [name],  [place] [id number]  Right renal cyst and nocturia   HPI: Blake Solis is a 19JY here for followup for a right renal cyst. He underwent CT in 11/2020 which showed a right 1.1cm simple renal cyst. No recent abdominal imaging. IPSS 12 QOL 3 on no BPH therapy. Nocturia 3-4. He decreased fluid prior to going to bed which failed to improve his nocturia.    PMH: Past Medical History:  Diagnosis Date   Arthritis    Bronchitis, allergic    Essential hypertension    On several medications.   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   Tick bite 01/26/2020    Surgical History: Past Surgical History:  Procedure Laterality Date   CARDIAC CATHETERIZATION  04/04/2002   Images reviewed: Normal coronaries   COLONOSCOPY N/A 09/06/2019   Procedure: COLONOSCOPY;  Surgeon: Rogene Houston, MD;  [facility];  Service: Endoscopy;  Laterality: N/A;  135   CYST EXCISION Right    cyst removed from arm   ELBOW SURGERY Left    HIP ARTHROPLASTY Right    KIDNEY DONATION Left    Was donor for his brother   KNEE ARTHROSCOPY Right 2013   lft ulnar nerve removed     decompression   MASS EXCISION Left 09/14/2021   Procedure: EXCISION SOFT TISSUE MASS LEFT FOOT;  Surgeon: Jobe Igo, DPM;  Location: [place];  Service: Podiatry;  Laterality: Left;  MAC WITH LOCAL   NEPHRECTOMY Left    left, donated to his brother, EF 55-60%...   POLYPECTOMY  09/06/2019   Procedure: POLYPECTOMY;  Surgeon: Rogene Houston, MD;  [facility];  Service: Endoscopy;;   SCROTAL EXPLORATION N/A 01/01/2021   Procedure: SCROTUM EXPLORATION- excision of scrotal sebacous cyst;   Surgeon: Cleon Gustin, MD;  [facility];  Service: Urology;  Laterality: N/A;   SHOULDER ARTHROSCOPY WITH ROTATOR CUFF REPAIR AND SUBACROMIAL DECOMPRESSION Right 10/14/2017   Procedure: RIGHT SHOULDER ARTHROSCOPY WITH EXTENSIVE DEBRIDEMENT, SUBACROMIAL DECOMPRESSION, DISTAL CLAVICLE EXCISION AND BICEPS TENODYSIS;  Surgeon: Leandrew Koyanagi, MD;  Location: [place];  Service: Orthopedics;  Laterality: Right;   TOTAL HIP ARTHROPLASTY Right 06/07/2012   Procedure: TOTAL HIP ARTHROPLASTY ANTERIOR APPROACH;  Surgeon: Marybelle Killings, MD;  Location: [place];  Service: Orthopedics;  Laterality: Right;  Right Total Hip Arthroplasty-Anterior Approach   TRANSTHORACIC ECHOCARDIOGRAM  04/28/2020   [facility]) EF 65 to 70%.  No or WMA.  GR 1 DD.  Mildly thickened aortic valve.  Mild to moderately dilated left atrium.  Normal RV size and function.  Mild RA dilation.:   ULNAR NERVE TRANSPOSITION Left 11/13/2020   Procedure: left elbow ulnar nerve neurolysis, ganglion cyst removal;  Surgeon: Leandrew Koyanagi, MD;  Location: [place];  Service: Orthopedics;  Laterality: Left;    Home Medications:  Allergies as of 12/18/2021   No Known Allergies      Medication List        Accurate as of December 18, 2021 10:53 AM. If you have any questions, ask your nurse or doctor.          amLODipine  10 MG tablet Commonly known as: NORVASC Take 10 mg by mouth daily.   losartan 100 MG tablet Commonly known as: COZAAR Take 100 mg by mouth daily.   pantoprazole 40 MG tablet Commonly known as: PROTONIX Take 1 tablet (40 mg total) by mouth daily.        Allergies: No Known Allergies  Family History: Family History  Problem Relation Age of Onset   Colon cancer Brother 57   Neurologic Disorder Mother        [name] - by report   Liver disease Father    Other Sister 88       Natural causes   Pancreatic cancer Brother 31       Unknown   Diabetes  Mellitus II Brother        Died from DM-Coma @ 41   Diabetic kidney disease Brother 34       End-stage- s/p Txplant (Blake Solis was the Donor)   Lung cancer Brother 33   Other Sister 4   Healthy Sister        He does not know much details   Arthritis Sister    Diabetes type II Sister    Arthritis Sister    Stomach cancer Sister     Social History:  reports that he quit smoking about 23 years ago. His smoking use included cigarettes. He has a 40.00 pack-year smoking history. He has never used smokeless tobacco. He reports that he does not drink alcohol and does not use drugs.  ROS: All other review of systems were reviewed and are negative except what is noted above in HPI  Physical Exam: BP (!) 156/78   Pulse 76   Constitutional:  Alert and oriented, No acute distress. HEENT: [place] AT, moist mucus membranes.  Trachea midline, no masses. Cardiovascular: No clubbing, cyanosis, or edema. Respiratory: Normal respiratory effort, no increased work of breathing. GI: Abdomen is soft, nontender, nondistended, no abdominal masses GU: No CVA tenderness.  Lymph: No cervical or inguinal lymphadenopathy. Skin: No rashes, bruises or suspicious lesions. Neurologic: Grossly intact, no focal deficits, moving all 4 extremities. Psychiatric: Normal mood and affect.  Laboratory Data: Lab Results  Component Value Date   WBC 6.2 09/02/2016   HGB 15.3 09/02/2016   HCT 45.0 09/02/2016   MCV 88.9 09/02/2016   PLT 136 (L) 09/02/2016    Lab Results  Component Value Date   CREATININE 1.26 (H) 09/02/2016    No results found for: "PSA"  No results found for: "TESTOSTERONE"  No results found for: "HGBA1C"  Urinalysis    Component Value Date/Time   COLORURINE YELLOW 06/02/2012 1326   APPEARANCEUR Clear 01/16/2021 0953   LABSPEC 1.015 06/02/2012 1326   PHURINE 8.0 06/02/2012 1326   GLUCOSEU Negative 01/16/2021 [place] 06/02/2012 1326   BILIRUBINUR Negative 01/16/2021 [place] 06/02/2012 1326   PROTEINUR Negative 01/16/2021 0953   PROTEINUR NEGATIVE 06/02/2012 1326   UROBILINOGEN 0.2 01/21/2020 1508   UROBILINOGEN 1.0 06/02/2012 1326   NITRITE Negative 01/16/2021 0953   NITRITE NEGATIVE 06/02/2012 1326   LEUKOCYTESUR Negative 01/16/2021 0953    Lab Results  Component Value Date   LABMICR Comment 01/16/2021   WBCUA None seen 12/23/2020   LABEPIT None seen 12/23/2020   BACTERIA None seen 12/23/2020    Pertinent Imaging: Ct abd/pelvis 11/2020: Images reviewed and discussed with the patient No results found for this or any previous visit.  No results found for this or  any previous visit.  No results found for this or any previous visit.  No results found for this or any previous visit.  No results found for this or any previous visit.  No valid procedures specified. No results found for this or any previous visit.  No results found for this or any previous visit.   Assessment & Plan:    1. Renal cyst -Renal US 3-4 weeks  2. BPh with nocturia -We will trial rapaflo 52m qhs. RTC 4 weeks   No follow-ups on file.  PNicolette Bang MD  [facility] [place]

## 2021-12-18 NOTE — Patient Instructions (Signed)

## 2021-12-21 ENCOUNTER — Telehealth: Payer: Self-pay | Admitting: Cardiology

## 2021-12-21 LAB — POCT URINALYSIS DIPSTICK
Bilirubin, UA: NEGATIVE
Blood, UA: NEGATIVE
Glucose, UA: NEGATIVE
Ketones, UA: NEGATIVE
Leukocytes, UA: NEGATIVE
Nitrite, UA: NEGATIVE
Protein, UA: POSITIVE — AB
Spec Grav, UA: 1.02 (ref 1.010–1.025)
Urobilinogen, UA: 0.2 E.U./dL
pH, UA: 5.5 (ref 5.0–8.0)

## 2021-12-21 NOTE — Telephone Encounter (Signed)
Called patient, advised that results posted today, once MD reviewed we would call and let him know recommendations.  Patient verbalized understanding.

## 2021-12-21 NOTE — Telephone Encounter (Signed)
Patient calling for heart monitor results.  

## 2021-12-23 ENCOUNTER — Telehealth: Payer: Self-pay | Admitting: Cardiology

## 2021-12-23 NOTE — Telephone Encounter (Signed)
Pt informed once preliminary report is reviewed by MD, nurse will call with results.  Pt verbalized understanding.

## 2021-12-23 NOTE — Telephone Encounter (Signed)
Follow Up:    Patient is calling to see if his Monitor results are ready? 

## 2021-12-25 ENCOUNTER — Encounter (HOSPITAL_COMMUNITY): Payer: BC Managed Care – PPO

## 2022-01-08 ENCOUNTER — Ambulatory Visit: Payer: BC Managed Care – PPO | Admitting: Urology

## 2022-01-08 ENCOUNTER — Ambulatory Visit (INDEPENDENT_AMBULATORY_CARE_PROVIDER_SITE_OTHER): Payer: BC Managed Care – PPO | Admitting: Urology

## 2022-01-08 VITALS — BP 138/76 | HR 58

## 2022-01-08 DIAGNOSIS — R351 Nocturia: Secondary | ICD-10-CM | POA: Diagnosis not present

## 2022-01-08 DIAGNOSIS — L723 Sebaceous cyst: Secondary | ICD-10-CM | POA: Diagnosis not present

## 2022-01-08 DIAGNOSIS — N401 Enlarged prostate with lower urinary tract symptoms: Secondary | ICD-10-CM

## 2022-01-08 DIAGNOSIS — N281 Cyst of kidney, acquired: Secondary | ICD-10-CM

## 2022-01-08 DIAGNOSIS — N138 Other obstructive and reflux uropathy: Secondary | ICD-10-CM

## 2022-01-08 MED ORDER — SULFAMETHOXAZOLE-TRIMETHOPRIM 800-160 MG PO TABS: 1.0000 | ORAL_TABLET | Freq: Two times a day (BID) | ORAL | 0 refills | Status: DC

## 2022-01-08 MED ORDER — MIRABEGRON ER 25 MG PO TB24: 25.0000 mg | ORAL_TABLET | Freq: Every day | ORAL | 0 refills | Status: DC

## 2022-01-08 NOTE — Progress Notes (Unsigned)
01/08/2022 12:08 PM   Blake Solis Dec 24, 1955 [id number]  Referring provider: Practice, [place],  [place] [id number]  No chief complaint on file.   HPI: He has a right cyst on his scortum that spontaneously drained last weeks. He has nocturia 2-3x on rapaflo.    PMH: Past Medical History:  Diagnosis Date   Arthritis    Bronchitis, allergic    Essential hypertension    On several medications.   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   Tick bite 01/26/2020    Surgical History: Past Surgical History:  Procedure Laterality Date   CARDIAC CATHETERIZATION  04/04/2002   Images reviewed: Normal coronaries   COLONOSCOPY N/A 09/06/2019   Procedure: COLONOSCOPY;  Surgeon: Rogene Houston, MD;  [facility];  Service: Endoscopy;  Laterality: N/A;  135   CYST EXCISION Right    cyst removed from arm   ELBOW SURGERY Left    HIP ARTHROPLASTY Right    KIDNEY DONATION Left    Was donor for his brother   KNEE ARTHROSCOPY Right 2013   lft ulnar nerve removed     decompression   MASS EXCISION Left 09/14/2021   Procedure: EXCISION SOFT TISSUE MASS LEFT FOOT;  Surgeon: Jobe Igo, DPM;  Location: [place];  Service: Podiatry;  Laterality: Left;  MAC WITH LOCAL   NEPHRECTOMY Left    left, donated to his brother, EF 55-60%...   POLYPECTOMY  09/06/2019   Procedure: POLYPECTOMY;  Surgeon: Rogene Houston, MD;  [facility];  Service: Endoscopy;;   SCROTAL EXPLORATION N/A 01/01/2021   Procedure: SCROTUM EXPLORATION- excision of scrotal sebacous cyst;  Surgeon: Cleon Gustin, MD;  [facility];  Service: Urology;  Laterality: N/A;   SHOULDER ARTHROSCOPY WITH ROTATOR CUFF REPAIR AND SUBACROMIAL DECOMPRESSION Right 10/14/2017    Procedure: RIGHT SHOULDER ARTHROSCOPY WITH EXTENSIVE DEBRIDEMENT, SUBACROMIAL DECOMPRESSION, DISTAL CLAVICLE EXCISION AND BICEPS TENODYSIS;  Surgeon: Leandrew Koyanagi, MD;  Location: [place];  Service: Orthopedics;  Laterality: Right;   TOTAL HIP ARTHROPLASTY Right 06/07/2012   Procedure: TOTAL HIP ARTHROPLASTY ANTERIOR APPROACH;  Surgeon: Marybelle Killings, MD;  Location: [place];  Service: Orthopedics;  Laterality: Right;  Right Total Hip Arthroplasty-Anterior Approach   TRANSTHORACIC ECHOCARDIOGRAM  04/28/2020   [facility]) EF 65 to 70%.  No or WMA.  GR 1 DD.  Mildly thickened aortic valve.  Mild to moderately dilated left atrium.  Normal RV size and function.  Mild RA dilation.:   ULNAR NERVE TRANSPOSITION Left 11/13/2020   Procedure: left elbow ulnar nerve neurolysis, ganglion cyst removal;  Surgeon: Leandrew Koyanagi, MD;  Location: [place];  Service: Orthopedics;  Laterality: Left;    Home Medications:  Allergies as of 01/08/2022   No Known Allergies      Medication List        Accurate as of January 08, 2022 12:08 PM. If you have any questions, ask your nurse or doctor.          amLODipine 10 MG tablet Commonly known as: NORVASC Take 10 mg by mouth daily.   losartan 100 MG tablet Commonly known as: COZAAR Take 100 mg by mouth daily.   pantoprazole 40 MG tablet  Commonly known as: PROTONIX Take 1 tablet (40 mg total) by mouth daily.   silodosin 8 MG Caps capsule Commonly known as: RAPAFLO Take 1 capsule (8 mg total) by mouth at bedtime.        Allergies: No Known Allergies  Family History: Family History  Problem Relation Age of Onset   Colon cancer Brother 71   Neurologic Disorder Mother        [name] - by report   Liver disease Father    Other Sister 27       Natural causes   Pancreatic cancer Brother 32       Unknown   Diabetes Mellitus II Brother        Died from DM-Coma @ 15   Diabetic kidney disease Brother  68       End-stage- s/p Txplant ([employer] was the Donor)   Lung cancer Brother 39   Other Sister 33   Healthy Sister        He does not know much details   Arthritis Sister    Diabetes type II Sister    Arthritis Sister    Stomach cancer Sister     Social History:  reports that he quit smoking about 23 years ago. His smoking use included cigarettes. He has a 40.00 pack-year smoking history. He has never used smokeless tobacco. He reports that he does not drink alcohol and does not use drugs.  ROS: All other review of systems were reviewed and are negative except what is noted above in HPI  Physical Exam: BP 138/76   Pulse (!) 58   Constitutional:  Alert and oriented, No acute distress. HEENT: [place] AT, moist mucus membranes.  Trachea midline, no masses. Cardiovascular: No clubbing, cyanosis, or edema. Respiratory: Normal respiratory effort, no increased work of breathing. GI: Abdomen is soft, nontender, nondistended, no abdominal masses GU: No CVA tenderness.  Lymph: No cervical or inguinal lymphadenopathy. Skin: No rashes, bruises or suspicious lesions. Neurologic: Grossly intact, no focal deficits, moving all 4 extremities. Psychiatric: Normal mood and affect.  Laboratory Data: Lab Results  Component Value Date   WBC 6.2 09/02/2016   HGB 15.3 09/02/2016   HCT 45.0 09/02/2016   MCV 88.9 09/02/2016   PLT 136 (L) 09/02/2016    Lab Results  Component Value Date   CREATININE 1.26 (H) 09/02/2016    No results found for: "PSA"  No results found for: "TESTOSTERONE"  No results found for: "HGBA1C"  Urinalysis    Component Value Date/Time   COLORURINE YELLOW 06/02/2012 1326   APPEARANCEUR Clear 01/16/2021 0953   LABSPEC 1.015 06/02/2012 1326   PHURINE 8.0 06/02/2012 1326   GLUCOSEU Negative 01/16/2021 0953   HGBUR NEGATIVE 06/02/2012 1326   BILIRUBINUR neg 12/21/2021 0940   BILIRUBINUR Negative 01/16/2021 0953   KETONESUR NEGATIVE 06/02/2012 1326   PROTEINUR Positive  (A) 12/21/2021 0940   PROTEINUR Negative 01/16/2021 0953   PROTEINUR NEGATIVE 06/02/2012 1326   UROBILINOGEN 0.2 12/21/2021 0940   UROBILINOGEN 1.0 06/02/2012 1326   NITRITE neg 12/21/2021 0940   NITRITE Negative 01/16/2021 0953   NITRITE NEGATIVE 06/02/2012 1326   LEUKOCYTESUR Negative 12/21/2021 0940   LEUKOCYTESUR Negative 01/16/2021 0953    Lab Results  Component Value Date   LABMICR Comment 01/16/2021   WBCUA None seen 12/23/2020   LABEPIT None seen 12/23/2020   BACTERIA None seen 12/23/2020    Pertinent Imaging: *** No results found for this or any previous visit.  No results found for  this or any previous visit.  No results found for this or any previous visit.  No results found for this or any previous visit.  No results found for this or any previous visit.  No valid procedures specified. No results found for this or any previous visit.  No results found for this or any previous visit.   Assessment & Plan:    1. Renal cyst We discussed benign nature of renal cysts. He does not require further workup   2. Benign prostatic hyperplasia with urinary obstruction ***  3. Nocturia ***  4. Sebaceous cyst -   No follow-ups on file.  Nicolette Bang, MD  [facility] [place]

## 2022-01-08 NOTE — Patient Instructions (Signed)
Epidermoid Cyst  An epidermoid cyst, also known as epidermal cyst, is a sac made of skin tissue. The sac contains a substance called keratin. Keratin is a protein that is normally secreted through the hair follicles. When keratin becomes trapped in the top layer of skin (epidermis), it can form an epidermoid cyst. Epidermoid cysts can be found anywhere on your body. These cysts are usually harmless (benign), and they may not cause symptoms unless they become inflamed or infected. What are the causes? This condition may be caused by: A blocked hair follicle. A hair that curls and re-enters the skin instead of growing straight out of the skin (ingrown hair). A blocked pore. Irritated skin. An injury to the skin. Certain conditions that are passed along from parent to child (inherited). Human papillomavirus (HPV). This happens rarely when cysts occur on the bottom of the feet. Long-term (chronic) sun damage to the skin. What increases the risk? The following factors may make you more likely to develop an epidermoid cyst: Having acne. Being male. Having an injury to the skin. Being past puberty. Having certain rare genetic disorders. What are the signs or symptoms? The only symptom of this condition may be a small, painless lump underneath the skin. When an epidermal cyst ruptures, it may become inflamed. True infection in cysts is rare. Symptoms may include: Redness. Inflammation. Tenderness. Warmth. Keratin draining from the cyst. Keratin is grayish-white, bad-smelling substance. Pus draining from the cyst. How is this diagnosed? This condition is diagnosed with a physical exam. In some cases, you may have a sample of tissue (biopsy) taken from your cyst to be examined under a microscope or tested for bacteria. You may be referred to a health care provider who specializes in skin care (dermatologist). How is this treated? If a cyst becomes inflamed, treatment may include: Opening and  draining the cyst, done by a health care provider. After draining, minor surgery to remove the rest of the cyst may be done. Taking antibiotic medicine. Having injections of medicines (steroids) that help to reduce inflammation. Having surgery to remove the cyst. Surgery may be done if the cyst: Becomes large. Bothers you. Has a chance of turning into cancer. Do not try to open a cyst yourself. Follow these instructions at home: Medicines If you were prescribed an antibiotic medicine, take it it as told by your health care provider. Do not stop using the antibiotic even if you start to feel better. Take over-the-counter and prescription medicines only as told by your health care provider. General instructions Keep the area around your cyst clean and dry. Wear loose, dry clothing. Avoid touching your cyst. Check your cyst every day for signs of infection. Check for: Redness, swelling, or pain. Fluid or blood. Warmth. Pus or a bad smell. Keep all follow-up visits. This is important. How is this prevented? Wear clean, dry, clothing. Avoid wearing tight clothing. Keep your skin clean and dry. Take showers or baths every day. Contact a health care provider if: Your cyst develops symptoms of infection. Your condition is not improving or is getting worse. You develop a cyst that looks different from other cysts you have had. You have a fever. Get help right away if: Redness spreads from the cyst into the surrounding area. Summary An epidermoid cyst is a sac made of skin tissue. These cysts are usually harmless (benign), and they may not cause symptoms unless they become inflamed. If a cyst becomes inflamed, treatment may include surgery to open and drain the   cyst, or to remove it. Treatment may also include medicines by mouth or through an injection. Take over-the-counter and prescription medicines only as told by your health care provider. If you were prescribed an antibiotic medicine,  take it as told by your health care provider. Do not stop using the antibiotic even if you start to feel better. Contact a health care provider if your condition is not improving or is getting worse. Keep all follow-up visits as told by your health care provider. This is important. This information is not intended to replace advice given to you by your health care provider. Make sure you discuss any questions you have with your health care provider. Document Revised: 06/13/2019 Document Reviewed: 06/13/2019 Elsevier Patient Education  2023 Elsevier Inc.  

## 2022-01-11 ENCOUNTER — Ambulatory Visit (HOSPITAL_COMMUNITY): Payer: BC Managed Care – PPO

## 2022-01-14 ENCOUNTER — Encounter: Payer: Self-pay | Admitting: Urology

## 2022-01-14 ENCOUNTER — Encounter: Payer: Self-pay | Admitting: Nurse Practitioner

## 2022-01-14 ENCOUNTER — Ambulatory Visit: Payer: BC Managed Care – PPO | Attending: Nurse Practitioner | Admitting: Nurse Practitioner

## 2022-01-14 VITALS — BP 118/68 | Wt 234.4 lb

## 2022-01-14 DIAGNOSIS — I4729 Other ventricular tachycardia: Secondary | ICD-10-CM | POA: Diagnosis not present

## 2022-01-14 DIAGNOSIS — E782 Mixed hyperlipidemia: Secondary | ICD-10-CM

## 2022-01-14 DIAGNOSIS — I471 Supraventricular tachycardia, unspecified: Secondary | ICD-10-CM

## 2022-01-14 DIAGNOSIS — I10 Essential (primary) hypertension: Secondary | ICD-10-CM

## 2022-01-14 DIAGNOSIS — R079 Chest pain, unspecified: Secondary | ICD-10-CM

## 2022-01-14 DIAGNOSIS — R001 Bradycardia, unspecified: Secondary | ICD-10-CM | POA: Diagnosis not present

## 2022-01-14 DIAGNOSIS — R42 Dizziness and giddiness: Secondary | ICD-10-CM

## 2022-01-14 DIAGNOSIS — E669 Obesity, unspecified: Secondary | ICD-10-CM

## 2022-01-14 MED ORDER — SILODOSIN 8 MG PO CAPS: 8.0000 mg | ORAL_CAPSULE | Freq: Every day | ORAL | 11 refills | Status: DC

## 2022-01-14 NOTE — Progress Notes (Signed)
Office Visit    Patient Name: Blake Solis Date of Encounter: 01/14/2022  Primary Care Provider:  P[facility], [facility] Cardiologist:  Glenetta Hew, MD  Chief Complaint    66 year old male with a history of hypertension, hyperlipidemia, bradycardia, solitary kidney (donated kidney to his brother), obesity, and former tobacco use who presents for follow-up related to bradycardia, fatigue, dyspnea, and chest pain.  Past Medical History    Past Medical History:  Diagnosis Date   Arthritis    Bronchitis, allergic    Essential hypertension    On several medications.   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   POLYPECTOMY  09/06/2019   Procedure: POLYPECTOMY;  Surgeon: Rogene Houston, MD;  [facility];  Service: Endoscopy;;   SCROTAL EXPLORATION N/A 01/01/2021   Procedure: SCROTUM EXPLORATION- excision of scrotal sebacous cyst;  Surgeon: Cleon Gustin, MD;  [facility];  Service: Urology;  Laterality: N/A;   SHOULDER ARTHROSCOPY WITH ROTATOR CUFF REPAIR AND SUBACROMIAL DECOMPRESSION Right 10/14/2017   Procedure: RIGHT SHOULDER ARTHROSCOPY WITH EXTENSIVE DEBRIDEMENT, SUBACROMIAL DECOMPRESSION, DISTAL CLAVICLE EXCISION AND BICEPS TENODYSIS;  Surgeon: Leandrew Koyanagi, MD;  Location: [place];  Service: Orthopedics;  Laterality: Right;   TOTAL HIP ARTHROPLASTY Right 06/07/2012   Procedure: TOTAL HIP ARTHROPLASTY ANTERIOR APPROACH;  Surgeon: Marybelle Killings, MD;  Location: [place];  Service: Orthopedics;  Laterality: Right;  Right Total Hip Arthroplasty-Anterior Approach   TRANSTHORACIC ECHOCARDIOGRAM  04/28/2020   [facility] EF 65 to 70%.  No or WMA.  GR 1 DD.  Mildly thickened aortic valve.  Mild to moderately dilated left atrium.  Normal RV size and function.  Mild RA dilation.:   ULNAR NERVE TRANSPOSITION Left 11/13/2020   Procedure: left elbow ulnar nerve neurolysis, ganglion cyst removal;  Surgeon: Leandrew Koyanagi, MD;  Location: [place];  Service: Orthopedics;  Laterality: Left;    Allergies  No Known Allergies  History of Present Illness    66 year old male with the above past medical history including hypertension, hyperlipidemia, bradycardia, solitary kidney (donated kidney to his brother), obesity, and former tobacco use.    He was initially referred to Dr. Ellyn Hack for the evaluation of chest pain and dyspnea on exertion.  He was evaluated at [facility] for the symptoms in February 2022.  Troponin was  negative, EKG showed sinus bradycardia.  CTA of the chest was negative for dissection/PE.  Lexiscan Myoview was ordered but not completed.  Echocardiogram in 04/2020 at [facility] showed EF 65 to 70%, no RWMA, G1 DD, mildly thickened aortic valve, mild to moderately dilated left atrium, normal RV size and function, mild RA dilation.  He was last seen in the office on 12/04/2021 and reported ongoing bradycardia,  fatigue, intermittent chest pain with vigorous exertion. ETT was recommended but unable to be completed due to dizziness, he was converted to chemical Lexiscan stress test which showed no evidence of ischemia or infarction, normal LV function, low risk.  Preliminary monitor results revealed predominantly sinus rhythm, min HR 43 bpm, max HR 169 bpm, average HR 57 bpm, 1 run of ventricular tachycardia 5 beats, 7 runs of  SVT,  longest interval lasting 18 beats, possible atrial tachycardia with variable block, patient triggered events were associated with sinus rhythm.  He presents today for follow-up.  Since his last visit he notes no change in his symptoms, ongoing dizziness.  He continues to note intermittently low HR. He denies presyncope, syncope.  He does note mild intermittent chest discomfort, ongoing dyspnea on exertion. Overall, his symptoms are unchanged.  Other than his ongoing dizziness, bradycardia, intermittent chest discomfort and dyspnea, he denies any additional concerns today.  Home Medications    Current Outpatient Medications  Medication Sig Dispense Refill   amLODipine (NORVASC) 10 MG tablet Take 10 mg by mouth daily.     losartan (COZAAR) 100 MG tablet Take 100 mg by mouth daily.     mirabegron ER (MYRBETRIQ) 25 MG TB24 tablet Take 1 tablet (25 mg total) by mouth daily. 30 tablet 0   pantoprazole (PROTONIX) 40 MG tablet Take 1 tablet (40 mg total) by mouth daily. 30 tablet 3   silodosin (RAPAFLO) 8 MG CAPS capsule Take 1 capsule (8 mg total) by mouth at bedtime. 30 capsule 11   sulfamethoxazole-trimethoprim (BACTRIM DS) 800-160 MG tablet Take 1 tablet by mouth every 12 (twelve) hours. 14 tablet 0   No current facility-administered medications for this visit.     Review of Systems    He denies palpitations, pnd, orthopnea, n, v, syncope, edema, weight gain, or early satiety. All other systems reviewed and are otherwise negative except as noted above.   Physical Exam    VS:   BP 118/68 (BP Location: Left Arm, Patient Position: Sitting)   Wt 234 lb 6.4 oz (106.3 kg)   SpO2 98%   BMI 33.63 kg/m  GEN: Well nourished, well developed, in no acute distress. HEENT: normal. Neck: Supple, no JVD, carotid bruits, or masses. Cardiac: RRR, no murmurs, rubs, or gallops. No clubbing, cyanosis, edema.  Radials/DP/PT 2+ and equal bilaterally.  Respiratory:  Respirations regular and unlabored, clear to auscultation bilaterally. GI: Soft, nontender, nondistended, BS + x 4. MS: no deformity or atrophy. Skin: warm and dry, no rash. Neuro:  Strength and sensation are intact. Psych: Normal affect.  Accessory Clinical Findings    ECG personally reviewed by me today - No EKG in office today.   Lab Results  Component Value Date   WBC 6.2 09/02/2016   HGB 15.3 09/02/2016   HCT 45.0 09/02/2016   MCV 88.9 09/02/2016   PLT 136 (L) 09/02/2016   Lab Results  Component Value Date   CREATININE 1.26 (H) 09/02/2016   BUN 15 09/02/2016   NA 136 09/02/2016   K 4.0 09/02/2016   CL 99 (L)  09/02/2016   CO2 24 09/02/2016   Lab Results  Component Value Date   ALT 21 06/02/2012   AST 30 06/02/2012   ALKPHOS 107 06/02/2012   BILITOT 0.4 06/02/2012   Lab Results  Component Value Date   CHOL 169 05/02/2020   HDL 45 05/02/2020   LDLCALC 105 (H) 05/02/2020   TRIG 105 05/02/2020   CHOLHDL 3.8 05/02/2020    No results found for: "HGBA1C"  Assessment & Plan    1. Chest pain/dyspnea on exertion: Echo in 04/2020 at [facility] showed EF 65 to 70%, no RWMA, G1 DD, mildly thickened aortic valve, mild to moderately dilated left atrium, normal RV size and function, mild RA dilation.  Recent [employer] showed no evidence of ischemia or infarction, normal LV function, low risk.  Work-up reassuring to date.  However, patient continues to note intermittent chest discomfort, mild dyspnea on exertion.  Discussed coronary calcium score for further risk stratification, however, patient  declines at this time.  I do not think further ischemic evaluation is warranted at this time, however, will discuss with Dr. Ellyn Hack. Discussed ED precautions. Continue to monitor symptoms.   2. Bradycardia/NSVT/PSVT/dizziness: Most recent echo as above. Preliminary monitor results revealed predominantly sinus rhythm, min HR 43 bpm, max HR 169 bpm, average HR 57 bpm, 1 run of ventricular tachycardia 5 beats, 7 runs of  SVT,  longest interval lasting 18 beats, possible atrial tachycardia with variable block, patient triggered events were associated with sinus rhythm.  His dizziness is unchanged.  Orthostatics were negative in office today.  Will review monitor results with Dr. Ellyn Hack.  Given ongoing symptoms, and baseline bradycardia he may benefit from EP referral.  3. Hypertension: BP well controlled. Continue current antihypertensive regimen.   4. Hyperlipidemia: LDL was 105 in 04/2020.  Patient declined coronary calcium scoring at this time.  He is not on statin therapy.  5. Disposition: Follow-up in 2 months.      [name], NP 01/14/2022, 7:39 PM

## 2022-01-14 NOTE — Patient Instructions (Signed)
Medication Instructions:  Your physician recommends that you continue on your current medications as directed. Please refer to the Current Medication list given to you today.   *If you need a refill on your cardiac medications before your next appointment, please call your pharmacy*   Lab Work: NONE ordered at this time of appointment   If you have labs (blood work) drawn today and your tests are completely normal, you will receive your results only by: [place] (if you have MyChart) OR A paper copy in the mail If you have any lab test that is abnormal or we need to change your treatment, we will call you to review the results.   Testing/Procedures: NONE ordered at this time of appointment     Follow-Up: At [facility], you and your health needs are our priority.  As part of our continuing mission to provide you with exceptional heart care, we have created designated Provider Care Teams.  These Care Teams include your primary Cardiologist (physician) and Advanced Practice Providers (APPs -  Physician Assistants and Nurse Practitioners) who all work together to provide you with the care you need, when you need it.  We recommend signing up for the patient portal called "MyChart".  Sign up information is provided on this After Visit Summary.  MyChart is used to connect with patients for Virtual Visits (Telemedicine).  Patients are able to view lab/test results, encounter notes, upcoming appointments, etc.  Non-urgent messages can be sent to your provider as well.   To learn more about what you can do with MyChart, go to [email or web address].    Your next appointment:   2-3 month(s)  The format for your next appointment:   In Person  Provider:   Diona Browner, NP        Other Instructions   Important Information About Sugar

## 2022-01-15 ENCOUNTER — Ambulatory Visit: Payer: BC Managed Care – PPO | Admitting: Urology

## 2022-01-20 ENCOUNTER — Ambulatory Visit: Payer: BC Managed Care – PPO | Admitting: Physician Assistant

## 2022-01-22 ENCOUNTER — Encounter: Payer: Self-pay | Admitting: Physician Assistant

## 2022-01-22 ENCOUNTER — Ambulatory Visit (INDEPENDENT_AMBULATORY_CARE_PROVIDER_SITE_OTHER): Payer: BC Managed Care – PPO | Admitting: Physician Assistant

## 2022-01-22 VITALS — BP 142/85 | HR 61 | Wt 234.0 lb

## 2022-01-22 DIAGNOSIS — L729 Follicular cyst of the skin and subcutaneous tissue, unspecified: Secondary | ICD-10-CM | POA: Diagnosis not present

## 2022-01-22 LAB — URINALYSIS, ROUTINE W REFLEX MICROSCOPIC
Bilirubin, UA: NEGATIVE
Glucose, UA: NEGATIVE
Ketones, UA: NEGATIVE
Leukocytes,UA: NEGATIVE
Nitrite, UA: NEGATIVE
Protein,UA: NEGATIVE
RBC, UA: NEGATIVE
Specific Gravity, UA: <1.005 — ABNORMAL LOW (ref 1.005–1.030)
Urobilinogen, Ur: 0.2 mg/dL (ref 0.2–1.0)
pH, UA: 5.5 (ref 5.0–7.5)

## 2022-01-22 NOTE — Progress Notes (Unsigned)
Assessment: 1. Scrotal cyst - Urinalysis, Routine w reflex microscopic    Plan: Surgical scheduling as soon as possible for excision of scrotal cyst as discussed with Dr. Alyson Ingles.   Chief Complaint: No chief complaint on file.   HPI: Blake Solis is a 66 y.o. male who presents for continued evaluation of scrotal cyst. Pt feels it has grown in size and it is more uncomfortable than before. No drainage, redness, fever, chills. Pt has had 2 similar cysts removed in the past and wants to schedule surgery for this one.  UA=clear  01/08/22 Blake Solis is a 66yo here for followup for a renal cyst and here for evaluation of a new right scrotal cyst. Abdominal US from 10/3 shows a right 1cm simple renal cyst. He has a right cyst on his scortum that spontaneously drained last week. No scrotal pain currently. He has nocturia 2-3x on rapaflo. No straining to urinate, urine stream is strong. No other complaints today   Portions of the above documentation were copied from a prior visit for review purposes only.  Allergies: No Known Allergies  PMH: Past Medical History:  Diagnosis Date   Arthritis    Bronchitis, allergic    Essential hypertension    On several medications.   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   POLYPECTOMY  09/06/2019   Procedure: POLYPECTOMY;  Surgeon: Rogene Houston, MD;  [facility];  Service: Endoscopy;;   SCROTAL EXPLORATION N/A 01/01/2021   Procedure: SCROTUM EXPLORATION- excision of scrotal sebacous cyst;  Surgeon: Cleon Gustin, MD;  Location: [facility];  Service: Urology;  Laterality: N/A;   SHOULDER ARTHROSCOPY WITH ROTATOR CUFF REPAIR AND SUBACROMIAL DECOMPRESSION Right 10/14/2017   Procedure: RIGHT SHOULDER ARTHROSCOPY WITH EXTENSIVE DEBRIDEMENT, SUBACROMIAL DECOMPRESSION, DISTAL CLAVICLE EXCISION AND BICEPS TENODYSIS;  Surgeon: Leandrew Koyanagi, MD;  Location: [place];  Service: Orthopedics;  Laterality: Right;   TOTAL HIP ARTHROPLASTY Right 06/07/2012   Procedure: TOTAL HIP ARTHROPLASTY ANTERIOR APPROACH;  Surgeon: Marybelle Killings, MD;  Location: [place];  Service: Orthopedics;  Laterality: Right;  Right Total Hip Arthroplasty-Anterior Approach   TRANSTHORACIC ECHOCARDIOGRAM  04/28/2020   [facility]) EF 65 to 70%.  No or WMA.  GR 1 DD.  Mildly thickened aortic valve.  Mild to moderately dilated left atrium.  Normal RV size  and function.  Mild RA dilation.:   ULNAR NERVE TRANSPOSITION Left 11/13/2020   Procedure: left elbow ulnar nerve neurolysis, ganglion cyst removal;  Surgeon: Leandrew Koyanagi, MD;  Location: [place];  Service: Orthopedics;  Laterality: Left;    SH: Social History   Tobacco Use   Smoking status: Former    Packs/day: 1.00    Years: 40.00    Total pack years: 40.00    Types: Cigarettes    Quit date: 04/22/1998    Years since quitting: 23.7   Smokeless tobacco: Never  Vaping Use    Vaping Use: Never used  Substance Use Topics   Alcohol use: No   Drug use: No    ROS: All other review of systems were reviewed and are negative except what is noted above in HPI  PE: BP (!) 142/85   Pulse 61   Wt 234 lb (106.1 kg)   BMI 33.58 kg/m  GENERAL APPEARANCE:  Well appearing, well developed, well nourished, NAD HEENT:  Atraumatic, normocephalic NECK:  Supple. Trachea midline ABDOMEN:  Soft, non-tender, no masses GU: Posterior inferior aspect of the scrotum on the right indicates 2 to 3 cm well-circumscribed, moderately tender, mobile lesion consistent with diagnosis of cyst.  This is increased in size from his last exam.  No skin induration, erythema, fluctuance. EXTREMITIES:  Moves all extremities well, without clubbing, cyanosis, or edema NEUROLOGIC:  Alert and oriented x 3 MENTAL STATUS:  appropriate BACK:  Non-tender to palpation, No CVAT SKIN:  Warm, dry, and intact   Results: Laboratory Data: Lab Results  Component Value Date   WBC 6.2 09/02/2016   HGB 15.3 09/02/2016   HCT 45.0 09/02/2016   MCV 88.9 09/02/2016   PLT 136 (L) 09/02/2016    Lab Results  Component Value Date   CREATININE 1.26 (H) 09/02/2016    No results found for: "PSA"  No results found for: "TESTOSTERONE"  No results found for: "HGBA1C"  Urinalysis    Component Value Date/Time   COLORURINE YELLOW 06/02/2012 1326   APPEARANCEUR Clear 01/16/2021 0953   LABSPEC 1.015 06/02/2012 1326   PHURINE 8.0 06/02/2012 1326   GLUCOSEU Negative 01/16/2021 0953   HGBUR NEGATIVE 06/02/2012 1326   BILIRUBINUR neg 12/21/2021 0940   BILIRUBINUR Negative 01/16/2021 0953   KETONESUR NEGATIVE 06/02/2012 1326   PROTEINUR Positive (A) 12/21/2021 0940   PROTEINUR Negative 01/16/2021 0953   PROTEINUR NEGATIVE 06/02/2012 1326   UROBILINOGEN 0.2 12/21/2021 0940   UROBILINOGEN 1.0 06/02/2012 1326   NITRITE neg 12/21/2021 0940   NITRITE Negative 01/16/2021 0953   NITRITE NEGATIVE 06/02/2012 1326    LEUKOCYTESUR Negative 12/21/2021 0940   LEUKOCYTESUR Negative 01/16/2021 0953    Lab Results  Component Value Date   LABMICR Comment 01/16/2021   WBCUA None seen 12/23/2020   LABEPIT None seen 12/23/2020   BACTERIA None seen 12/23/2020    Pertinent Imaging: No results found for this or any previous visit.  No results found for this or any previous visit.  No results found for this or any previous visit.  No results found for this or any previous visit.  No results found for this or any previous visit.  No valid procedures specified. No results found for this or any previous visit.  No results found for this or any previous visit.  No results found for this or any previous visit (from the past 24 hour(s)).

## 2022-01-22 NOTE — H&P (View-Only) (Signed)
 Assessment: 1. Scrotal cyst - Urinalysis, Routine w reflex microscopic    Plan: Surgical scheduling as soon as possible for excision of scrotal cyst as discussed with Dr. McKenzie.   Chief Complaint: No chief complaint on file.   HPI: Blake Solis is a 66 y.o. male who presents for continued evaluation of scrotal cyst. Pt feels it has grown in size and it is more uncomfortable than before. No drainage, redness, fever, chills. Pt has had 2 similar cysts removed in the past and wants to schedule surgery for this one.  UA=clear  01/08/22 Blake Solis is a 66yo here for followup for a renal cyst and here for evaluation of a new right scrotal cyst. Abdominal US from 10/3 shows a right 1cm simple renal cyst. He has a right cyst on his scortum that spontaneously drained last week. No scrotal pain currently. He has nocturia 2-3x on rapaflo. No straining to urinate, urine stream is strong. No other complaints today   Portions of the above documentation were copied from a prior visit for review purposes only.  Allergies: No Known Allergies  PMH: Past Medical History:  Diagnosis Date   Arthritis    Bronchitis, allergic    Essential hypertension    On several medications.   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   POLYPECTOMY  09/06/2019   Procedure: POLYPECTOMY;  Surgeon: Rehman, Najeeb U, MD;  [facility];  Service: Endoscopy;;   SCROTAL EXPLORATION N/A 01/01/2021   Procedure: SCROTUM EXPLORATION- excision of scrotal sebacous cyst;  Surgeon: McKenzie, Patrick L, MD;  [facility];  Service: Urology;  Laterality: N/A;   SHOULDER ARTHROSCOPY WITH ROTATOR CUFF REPAIR AND SUBACROMIAL DECOMPRESSION Right 10/14/2017   Procedure: RIGHT SHOULDER ARTHROSCOPY WITH EXTENSIVE DEBRIDEMENT, SUBACROMIAL DECOMPRESSION, DISTAL CLAVICLE EXCISION AND BICEPS TENODYSIS;  Surgeon: Xu, Naiping M, MD;  Location: [facility];  Service: Orthopedics;  Laterality: Right;   TOTAL HIP ARTHROPLASTY Right 06/07/2012   Procedure: TOTAL HIP ARTHROPLASTY ANTERIOR APPROACH;  Surgeon: Mark C Yates, MD;  [facility];  Service: Orthopedics;  Laterality: Right;  Right Total Hip Arthroplasty-Anterior Approach   TRANSTHORACIC ECHOCARDIOGRAM  04/28/2020   ([facility]) EF 65 to 70%.  No or WMA.  GR 1 DD.  Mildly thickened aortic valve.  Mild to moderately dilated left atrium.  Normal RV size   and function.  Mild RA dilation.:   ULNAR NERVE TRANSPOSITION Left 11/13/2020   Procedure: left elbow ulnar nerve neurolysis, ganglion cyst removal;  Surgeon: Leandrew Koyanagi, MD;  Location: [place];  Service: Orthopedics;  Laterality: Left;    SH: Social History   Tobacco Use   Smoking status: Former    Packs/day: 1.00    Years: 40.00    Total pack years: 40.00    Types: Cigarettes    Quit date: 04/22/1998    Years since quitting: 23.7   Smokeless tobacco: Never  Vaping Use    Vaping Use: Never used  Substance Use Topics   Alcohol use: No   Drug use: No    ROS: All other review of systems were reviewed and are negative except what is noted above in HPI  PE: BP (!) 142/85   Pulse 61   Wt 234 lb (106.1 kg)   BMI 33.58 kg/m  GENERAL APPEARANCE:  Well appearing, well developed, well nourished, NAD HEENT:  Atraumatic, normocephalic NECK:  Supple. Trachea midline ABDOMEN:  Soft, non-tender, no masses GU: Posterior inferior aspect of the scrotum on the right indicates 2 to 3 cm well-circumscribed, moderately tender, mobile lesion consistent with diagnosis of cyst.  This is increased in size from his last exam.  No skin induration, erythema, fluctuance. EXTREMITIES:  Moves all extremities well, without clubbing, cyanosis, or edema NEUROLOGIC:  Alert and oriented x 3 MENTAL STATUS:  appropriate BACK:  Non-tender to palpation, No CVAT SKIN:  Warm, dry, and intact   Results: Laboratory Data: Lab Results  Component Value Date   WBC 6.2 09/02/2016   HGB 15.3 09/02/2016   HCT 45.0 09/02/2016   MCV 88.9 09/02/2016   PLT 136 (L) 09/02/2016    Lab Results  Component Value Date   CREATININE 1.26 (H) 09/02/2016    No results found for: "PSA"  No results found for: "TESTOSTERONE"  No results found for: "HGBA1C"  Urinalysis    Component Value Date/Time   COLORURINE YELLOW 06/02/2012 1326   APPEARANCEUR Clear 01/16/2021 0953   LABSPEC 1.015 06/02/2012 1326   PHURINE 8.0 06/02/2012 1326   GLUCOSEU Negative 01/16/2021 0953   HGBUR NEGATIVE 06/02/2012 1326   BILIRUBINUR neg 12/21/2021 0940   BILIRUBINUR Negative 01/16/2021 0953   KETONESUR NEGATIVE 06/02/2012 1326   PROTEINUR Positive (A) 12/21/2021 0940   PROTEINUR Negative 01/16/2021 0953   PROTEINUR NEGATIVE 06/02/2012 1326   UROBILINOGEN 0.2 12/21/2021 0940   UROBILINOGEN 1.0 06/02/2012 1326   NITRITE neg 12/21/2021 0940   NITRITE Negative 01/16/2021 0953   NITRITE NEGATIVE 06/02/2012 1326    LEUKOCYTESUR Negative 12/21/2021 0940   LEUKOCYTESUR Negative 01/16/2021 0953    Lab Results  Component Value Date   LABMICR Comment 01/16/2021   WBCUA None seen 12/23/2020   LABEPIT None seen 12/23/2020   BACTERIA None seen 12/23/2020    Pertinent Imaging: No results found for this or any previous visit.  No results found for this or any previous visit.  No results found for this or any previous visit.  No results found for this or any previous visit.  No results found for this or any previous visit.  No valid procedures specified. No results found for this or any previous visit.  No results found for this or any previous visit.  No results found for this or any previous visit (from the past 24 hour(s)).

## 2022-02-03 NOTE — Patient Instructions (Signed)
Blake Solis  02/03/2022     '@PREFPERIOPPHARMACY'$ @   Your procedure is scheduled on 02/08/22.  Report to [facility] at 1135 A.M.  Call this number if you have problems the morning of surgery:  [phone]  If you experience any cold or flu symptoms such as cough, fever, chills, shortness of breath, etc. between now and your scheduled surgery, please notify us at the above number.   Remember:  Do not eat or drink after midnight.      Take these medicines the morning of surgery with A SIP OF WATER norvasc, mirabegron, protonix & pregablin    Do not wear jewelry, make-up or nail polish.  Do not wear lotions, powders, or perfumes, or deodorant.  Do not shave 48 hours prior to surgery.  Men may shave face and neck.  Do not bring valuables to the hospital.  [facility] is not responsible for any belongings or valuables.  Contacts, dentures or bridgework may not be worn into surgery.  Leave your suitcase in the car.  After surgery it may be brought to your room.  For patients admitted to the hospital, discharge time will be determined by your treatment team.  Patients discharged the day of surgery will not be allowed to drive home.   Name and phone number of your driver:   family Special instructions:    Please read over the following fact sheets that you were given. Surgical Site Infection Prevention and Anesthesia Post-op Instructions      General Anesthesia, Adult General anesthesia is the use of medicine to make you fall asleep (unconscious) for a medical procedure. General anesthesia must be used for certain procedures. It is often recommended for surgery or procedures that: Last a long time. Require you to be still or in an unusual position. Are major and can cause blood loss. Affect your breathing. The medicines used for general anesthesia are called general anesthetics. During general anesthesia, these medicines are given along with medicines that: Prevent  pain. Control your blood pressure. Relax your muscles. Prevent nausea and vomiting after the procedure. Tell a health care provider about: Any allergies you have. All medicines you are taking, including vitamins, herbs, eye drops, creams, and over-the-counter medicines. Your history of any: Medical conditions you have, including: High blood pressure. Bleeding problems. Diabetes. Heart or lung conditions, such as: Heart failure. Sleep apnea. Asthma. Chronic obstructive pulmonary disease (COPD). Current or recent illnesses, such as: Upper respiratory, chest, or ear infections. Cough or fever. Tobacco or drug use, including marijuana or alcohol use. Depression or anxiety. Surgeries and types of anesthetics you have had. Problems you or family members have had with anesthetic medicines. Whether you are pregnant or may be pregnant. Whether you have any chipped or loose teeth, dentures, caps, bridgework, or issues with your mouth, swallowing, or choking. What are the risks? Your health care provider will talk with you about risks. These may include: Allergic reaction to the medicines. Lung and heart problems. Inhaling food or liquid from the stomach into the lungs (aspiration). Nerve injury. Injury to the lips, mouth, teeth, or gums. Stroke. Waking up during your procedure and being unable to move. This is rare. These problems are more likely to develop if you are having a major surgery or if you have an advanced or serious medical condition. You can prevent some of these complications by answering all of your health care provider's questions thoroughly and by following all instructions before your procedure. General anesthesia can  cause side effects, including: Nausea or vomiting. A sore throat or hoarseness from the breathing tube. Wheezing or coughing. Shaking chills or feeling cold. Body aches. Sleepiness. Confusion, agitation (delirium), or anxiety. What happens before the  procedure? When to stop eating and drinking Follow instructions from your health care provider about what you may eat and drink before your procedure. If you do not follow your health care provider's instructions, your procedure may be delayed or canceled. Medicines Ask your health care provider about: Changing or stopping your regular medicines. These include any diabetes medicines or blood thinners you take. Taking medicines such as aspirin and ibuprofen. These medicines can thin your blood. Do not take them unless your health care provider tells you to. Taking over-the-counter medicines, vitamins, herbs, and supplements. General instructions Do not use any products that contain nicotine or tobacco for at least 4 weeks before the procedure. These products include cigarettes, chewing tobacco, and vaping devices, such as e-cigarettes. If you need help quitting, ask your health care provider. If you brush your teeth on the morning of the procedure, make sure to spit out all of the water and toothpaste. If told by your health care provider, bring your sleep apnea device with you to surgery (if applicable). If you will be going home right after the procedure, plan to have a responsible adult: Take you home from the hospital or clinic. You will not be allowed to drive. Care for you for the time you are told. What happens during the procedure?  An IV will be inserted into one of your veins. You will be given one or more of the following through a face mask or IV: A sedative. This helps you relax. Anesthesia. This will: Numb certain areas of your body. Make you fall asleep for surgery. After you are unconscious, a breathing tube may be inserted down your throat to help you breathe. This will be removed before you wake up. An anesthesia provider, such as an anesthesiologist, will stay with you throughout your procedure. The anesthesia provider will: Keep you comfortable and safe by continuing to  give you medicines and adjusting the amount of medicine that you get. Monitor your blood pressure, heart rate, and oxygen levels to make sure that the anesthetics do not cause any problems. The procedure may vary among health care providers and hospitals. What happens after the procedure? Your blood pressure, temperature, heart rate, breathing rate, and blood oxygen level will be monitored until you leave the hospital or clinic. You will wake up in a recovery area. You may wake up slowly. You may be given medicine to help you with pain, nausea, or any other side effects from the anesthesia. Summary General anesthesia is the use of medicine to make you fall asleep (unconscious) for a medical procedure. Follow your health care provider's instructions about when to stop eating, drinking, or taking certain medicines before your procedure. Plan to have a responsible adult take you home from the hospital or clinic. This information is not intended to replace advice given to you by your health care provider. Make sure you discuss any questions you have with your health care provider. Document Revised: 06/04/2021 Document Reviewed: 06/04/2021 Elsevier Patient Education  [place] POST-ANESTHESIA  IMMEDIATELY FOLLOWING SURGERY:  Do not drive or operate machinery for the first twenty four hours after surgery.  Do not make any important decisions for twenty four hours after surgery or while taking narcotic pain medications or sedatives.  If you  develop intractable nausea and vomiting or a severe headache please notify your doctor immediately.  FOLLOW-UP:  Please make an appointment with your surgeon as instructed. You do not need to follow up with anesthesia unless specifically instructed to do so.  WOUND CARE INSTRUCTIONS (if applicable):  Keep a dry clean dressing on the anesthesia/puncture wound site if there is drainage.  Once the wound has quit draining you may leave it  open to air.  Generally you should leave the bandage intact for twenty four hours unless there is drainage.  If the epidural site drains for more than 36-48 hours please call the anesthesia department.  QUESTIONS?:  Please feel free to call your physician or the hospital operator if you have any questions, and they will be happy to assist you.      Epidermoid Cyst  An epidermoid cyst, also called an epidermal cyst, is a small lump under your skin. The cyst contains a substance called keratin. Do not try to pop or open the cyst yourself. What are the causes? A blocked hair follicle. A hair that curls and re-enters the skin instead of growing straight out of the skin. A blocked pore. Irritated skin. An injury to the skin. Certain conditions that are passed along from parent to child. Human papillomavirus (HPV). This happens rarely when cysts occur on the bottom of the feet. Long-term sun damage to the skin. What increases the risk? Having acne. Being male. Having an injury to the skin. Being past puberty. Having certain conditions caused by genes (genetic disorder) What are the signs or symptoms? These cysts are usually harmless, but they can get infected. Symptoms of infection may include: Redness. Inflammation. Tenderness. Warmth. Fever. A bad-smelling substance that drains from the cyst. Pus that drains from the cyst. How is this treated? In many cases, epidermoid cysts go away on their own without treatment. If a cyst becomes infected, treatment may include: Opening and draining the cyst, done by a doctor. After draining, you may need minor surgery to remove the rest of the cyst. Antibiotic medicine. Shots of medicines (steroids) that help to reduce inflammation. Surgery to remove the cyst. Surgery may be done if the cyst: Becomes large. Bothers you. Has a chance of turning into cancer. Do not try to open a cyst yourself. Follow these instructions at home: Medicines Take  over-the-counter and prescription medicines as told by your doctor. If you were prescribed an antibiotic medicine, take it as told by your doctor. Do not stop taking it even if you start to feel better. General instructions Keep the area around your cyst clean and dry. Wear loose, dry clothing. Avoid touching your cyst. Check your cyst every day for signs of infection. Check for: Redness, swelling, or pain. Fluid or blood. Warmth. Pus or a bad smell. Keep all follow-up visits. How is this prevented? Wear clean, dry, clothing. Avoid wearing tight clothing. Keep your skin clean and dry. Take showers or baths every day. Contact a doctor if: Your cyst has symptoms of infection. Your condition does not improve or gets worse. You have a cyst that looks different from other cysts you have had. You have a fever. Get help right away if: Redness spreads from the cyst into the area close by. Summary An epidermoid cyst is a small lump under your skin. If a cyst becomes infected, treatment may include surgery to open and drain the cyst, or to remove it. Take over-the-counter and prescription medicines only as told by your doctor.  Contact a doctor if your condition is not improving or is getting worse. Keep all follow-up visits. This information is not intended to replace advice given to you by your health care provider. Make sure you discuss any questions you have with your health care provider. Document Revised: 06/13/2019 Document Reviewed: 06/13/2019 Elsevier Patient Education  [place].

## 2022-02-05 ENCOUNTER — Encounter (HOSPITAL_COMMUNITY): Payer: Self-pay

## 2022-02-05 ENCOUNTER — Ambulatory Visit: Payer: BC Managed Care – PPO | Admitting: Urology

## 2022-02-05 ENCOUNTER — Encounter (HOSPITAL_COMMUNITY)
Admission: RE | Admit: 2022-02-05 | Discharge: 2022-02-05 | Disposition: A | Discharge status: Home / Self Care | Payer: BC Managed Care – PPO | Source: Ambulatory Visit | Attending: Urology | Admitting: Urology

## 2022-02-05 VITALS — BP 140/67 | HR 58 | Temp 97.6°F | Resp 18 | Ht 70.0 in | Wt 234.0 lb

## 2022-02-05 DIAGNOSIS — L720 Epidermal cyst: Secondary | ICD-10-CM | POA: Diagnosis not present

## 2022-02-05 DIAGNOSIS — Z01812 Encounter for preprocedural laboratory examination: Secondary | ICD-10-CM | POA: Insufficient documentation

## 2022-02-05 DIAGNOSIS — L723 Sebaceous cyst: Secondary | ICD-10-CM | POA: Insufficient documentation

## 2022-02-05 DIAGNOSIS — K219 Gastro-esophageal reflux disease without esophagitis: Secondary | ICD-10-CM | POA: Diagnosis not present

## 2022-02-05 DIAGNOSIS — Z87891 Personal history of nicotine dependence: Secondary | ICD-10-CM | POA: Diagnosis not present

## 2022-02-05 DIAGNOSIS — I10 Essential (primary) hypertension: Secondary | ICD-10-CM | POA: Diagnosis not present

## 2022-02-05 LAB — BASIC METABOLIC PANEL
Anion gap: 11 (ref 5–15)
BUN: 17 mg/dL (ref 8–23)
CO2: 23 mmol/L (ref 22–32)
Calcium: 9.2 mg/dL (ref 8.9–10.3)
Chloride: 105 mmol/L (ref 98–111)
Creatinine, Ser: 1.23 mg/dL (ref 0.61–1.24)
GFR, Estimated: >60 mL/min (ref >60)
Glucose, Bld: 102 mg/dL — ABNORMAL HIGH (ref 70–99)
Potassium: 3.3 mmol/L — ABNORMAL LOW (ref 3.5–5.1)
Sodium: 139 mmol/L (ref 135–145)

## 2022-02-05 NOTE — Progress Notes (Signed)
   02/05/22 1017  OBSTRUCTIVE SLEEP APNEA  Have you ever been diagnosed with sleep apnea through a sleep study? No  Do you snore loudly (loud enough to be heard through closed doors)?  1  Do you often feel tired, fatigued, or sleepy during the daytime (such as falling asleep during driving or talking to someone)? 0  Has anyone observed you stop breathing during your sleep? 0  Do you have, or are you being treated for high blood pressure? 1  BMI more than 35 kg/m2? 1  Age > 50 (1-yes) 1  Neck circumference greater than:Male 16 inches or larger, Male 17inches or larger? 0  Male Gender (Yes=1) 1  Obstructive Sleep Apnea Score 5

## 2022-02-08 ENCOUNTER — Ambulatory Visit (HOSPITAL_COMMUNITY)
Admission: RE | Admit: 2022-02-08 | Discharge: 2022-02-08 | Disposition: A | Discharge status: Home / Self Care | Payer: BC Managed Care – PPO | Attending: Urology | Admitting: Urology

## 2022-02-08 ENCOUNTER — Other Ambulatory Visit: Payer: Self-pay

## 2022-02-08 ENCOUNTER — Ambulatory Visit (HOSPITAL_COMMUNITY): Payer: BC Managed Care – PPO | Admitting: Anesthesiology

## 2022-02-08 ENCOUNTER — Encounter (HOSPITAL_COMMUNITY)
Admission: RE | Disposition: A | Discharge status: Home / Self Care | Payer: Self-pay | Source: Home / Self Care | Attending: Urology

## 2022-02-08 ENCOUNTER — Encounter (HOSPITAL_COMMUNITY): Payer: Self-pay | Admitting: Urology

## 2022-02-08 DIAGNOSIS — K219 Gastro-esophageal reflux disease without esophagitis: Secondary | ICD-10-CM | POA: Insufficient documentation

## 2022-02-08 DIAGNOSIS — Z87891 Personal history of nicotine dependence: Secondary | ICD-10-CM | POA: Insufficient documentation

## 2022-02-08 DIAGNOSIS — L723 Sebaceous cyst: Secondary | ICD-10-CM | POA: Diagnosis not present

## 2022-02-08 DIAGNOSIS — L720 Epidermal cyst: Secondary | ICD-10-CM | POA: Insufficient documentation

## 2022-02-08 DIAGNOSIS — I10 Essential (primary) hypertension: Secondary | ICD-10-CM | POA: Insufficient documentation

## 2022-02-08 HISTORY — PX: SCROTAL EXPLORATION: SHX2386

## 2022-02-08 SURGERY — EXPLORATION, SCROTUM
Anesthesia: General | Site: Scrotum

## 2022-02-08 MED ORDER — DOXYCYCLINE HYCLATE 100 MG PO CAPS: 100.0000 mg | ORAL_CAPSULE | Freq: Two times a day (BID) | ORAL | 0 refills | Status: DC

## 2022-02-08 MED ORDER — 0.9 % SODIUM CHLORIDE (POUR BTL) OPTIME
TOPICAL | Status: DC | PRN
  Administered 2022-02-08: 1000 mL

## 2022-02-08 MED ORDER — BUPIVACAINE HCL 0.25 % IJ SOLN
INTRAMUSCULAR | Status: DC | PRN
  Administered 2022-02-08: 9 mL

## 2022-02-08 MED ORDER — DEXAMETHASONE SODIUM PHOSPHATE 4 MG/ML IJ SOLN
INTRAMUSCULAR | Status: DC | PRN
  Administered 2022-02-08: 5 mg via INTRAVENOUS

## 2022-02-08 MED ORDER — FENTANYL CITRATE (PF) 100 MCG/2ML IJ SOLN
INTRAMUSCULAR | Status: AC
  Filled 2022-02-08: qty 2

## 2022-02-08 MED ORDER — ORAL CARE MOUTH RINSE: 15.0000 mL | Freq: Once | OROMUCOSAL | Status: DC

## 2022-02-08 MED ORDER — ONDANSETRON HCL 4 MG/2ML IJ SOLN: 4.0000 mg | Freq: Once | INTRAMUSCULAR | Status: DC | PRN

## 2022-02-08 MED ORDER — LACTATED RINGERS IV SOLN: INTRAVENOUS | Status: DC

## 2022-02-08 MED ORDER — LIDOCAINE HCL (CARDIAC) PF 100 MG/5ML IV SOSY
PREFILLED_SYRINGE | INTRAVENOUS | Status: DC | PRN
  Administered 2022-02-08: 100 mg via INTRAVENOUS

## 2022-02-08 MED ORDER — PROPOFOL 10 MG/ML IV BOLUS
INTRAVENOUS | Status: DC | PRN
  Administered 2022-02-08: 150 mg via INTRAVENOUS

## 2022-02-08 MED ORDER — ONDANSETRON HCL 4 MG/2ML IJ SOLN
INTRAMUSCULAR | Status: AC
  Filled 2022-02-08: qty 2

## 2022-02-08 MED ORDER — LACTATED RINGERS IV SOLN: INTRAVENOUS | Status: DC | PRN

## 2022-02-08 MED ORDER — ONDANSETRON HCL 4 MG/2ML IJ SOLN
INTRAMUSCULAR | Status: DC | PRN
  Administered 2022-02-08: 4 mg via INTRAVENOUS

## 2022-02-08 MED ORDER — CEFAZOLIN SODIUM-DEXTROSE 2-4 GM/100ML-% IV SOLN
2.0000 g | INTRAVENOUS | Status: AC
  Administered 2022-02-08: 2 g via INTRAVENOUS
  Filled 2022-02-08: qty 100

## 2022-02-08 MED ORDER — HYDROCODONE-ACETAMINOPHEN 5-325 MG PO TABS: 1.0000 | ORAL_TABLET | Freq: Four times a day (QID) | ORAL | 0 refills | Status: DC | PRN

## 2022-02-08 MED ORDER — PROPOFOL 10 MG/ML IV BOLUS
INTRAVENOUS | Status: AC
  Filled 2022-02-08: qty 20

## 2022-02-08 MED ORDER — BUPIVACAINE HCL (PF) 0.25 % IJ SOLN
INTRAMUSCULAR | Status: AC
  Filled 2022-02-08: qty 30

## 2022-02-08 MED ORDER — DEXAMETHASONE SODIUM PHOSPHATE 10 MG/ML IJ SOLN
INTRAMUSCULAR | Status: AC
  Filled 2022-02-08: qty 1

## 2022-02-08 MED ORDER — MIDAZOLAM HCL 5 MG/5ML IJ SOLN
INTRAMUSCULAR | Status: DC | PRN
  Administered 2022-02-08: 2 mg via INTRAVENOUS

## 2022-02-08 MED ORDER — FENTANYL CITRATE PF 50 MCG/ML IJ SOSY
25.0000 ug | PREFILLED_SYRINGE | INTRAMUSCULAR | Status: DC | PRN
  Administered 2022-02-08 (×2): 50 ug via INTRAVENOUS
  Filled 2022-02-08 (×2): qty 1

## 2022-02-08 MED ORDER — MIDAZOLAM HCL 2 MG/2ML IJ SOLN
INTRAMUSCULAR | Status: AC
  Filled 2022-02-08: qty 2

## 2022-02-08 MED ORDER — CHLORHEXIDINE GLUCONATE 0.12 % MT SOLN: 15.0000 mL | Freq: Once | OROMUCOSAL | Status: DC

## 2022-02-08 MED ORDER — LIDOCAINE HCL (PF) 2 % IJ SOLN
INTRAMUSCULAR | Status: AC
  Filled 2022-02-08: qty 5

## 2022-02-08 MED ORDER — FENTANYL CITRATE (PF) 100 MCG/2ML IJ SOLN
INTRAMUSCULAR | Status: DC | PRN
  Administered 2022-02-08 (×2): 50 ug via INTRAVENOUS

## 2022-02-08 SURGICAL SUPPLY — 36 items
ADH SKN CLS APL DERMABOND .7 (GAUZE/BANDAGES/DRESSINGS) ×1
BNDG GAUZE DERMACEA FLUFF 4 (GAUZE/BANDAGES/DRESSINGS) IMPLANT
BNDG GAUZE ELAST 4 BULKY (GAUZE/BANDAGES/DRESSINGS) IMPLANT
BNDG GZE DERMACEA 4 6PLY (GAUZE/BANDAGES/DRESSINGS) ×1
COVER LIGHT HANDLE STERIS (MISCELLANEOUS) ×2 IMPLANT
DERMABOND ADVANCED .7 DNX12 (GAUZE/BANDAGES/DRESSINGS) IMPLANT
DRAIN PENROSE 0.5X18 (DRAIN) IMPLANT
ELECT NDL TIP 2.8 STRL (NEEDLE) IMPLANT
ELECT NEEDLE TIP 2.8 STRL (NEEDLE) IMPLANT
ELECT REM PT RETURN 9FT ADLT (ELECTROSURGICAL) ×1
ELECTRODE REM PT RTRN 9FT ADLT (ELECTROSURGICAL) ×1 IMPLANT
GAUZE SPONGE 4X4 12PLY STRL (GAUZE/BANDAGES/DRESSINGS) ×1 IMPLANT
GLOVE BIO SURGEON STRL SZ8 (GLOVE) ×1 IMPLANT
GLOVE BIOGEL PI IND STRL 7.0 (GLOVE) ×2 IMPLANT
GLOVE BIOGEL PI IND STRL 8 (GLOVE) ×1 IMPLANT
GOWN STRL REUS W/TWL LRG LVL3 (GOWN DISPOSABLE) ×1 IMPLANT
GOWN STRL REUS W/TWL XL LVL3 (GOWN DISPOSABLE) ×1 IMPLANT
KIT TURNOVER KIT A (KITS) ×1 IMPLANT
MANIFOLD NEPTUNE II (INSTRUMENTS) ×1 IMPLANT
NDL HYPO 25X1 1.5 SAFETY (NEEDLE) ×1 IMPLANT
NEEDLE HYPO 25X1 1.5 SAFETY (NEEDLE) ×1 IMPLANT
PACK MINOR (CUSTOM PROCEDURE TRAY) ×1 IMPLANT
PAD ARMBOARD 7.5X6 YLW CONV (MISCELLANEOUS) ×1 IMPLANT
SET BASIN LINEN APH (SET/KITS/TRAYS/PACK) ×1 IMPLANT
SOL PREP POV-IOD 4OZ 10% (MISCELLANEOUS) ×1 IMPLANT
SOL PREP PROV IODINE SCRUB 4OZ (MISCELLANEOUS) ×1 IMPLANT
SUPPORT SCROTAL LG STRP (MISCELLANEOUS) ×1 IMPLANT
SUT CHROMIC 3 0 SH 27 (SUTURE) IMPLANT
SUT CHROMIC 4 0 PS 2 18 (SUTURE) IMPLANT
SUT MNCRL AB 4-0 PS2 18 (SUTURE) IMPLANT
SUT PROLENE 4 0 RB 1 (SUTURE)
SUT PROLENE 4-0 RB1 .5 CRCL 36 (SUTURE) IMPLANT
SUT SILK 0 FSL (SUTURE) IMPLANT
SUT VIC AB 3-0 SH 27 (SUTURE) ×1
SUT VIC AB 3-0 SH 27X BRD (SUTURE) IMPLANT
SYR CONTROL 10ML LL (SYRINGE) ×1 IMPLANT

## 2022-02-08 NOTE — Anesthesia Preprocedure Evaluation (Signed)
Anesthesia Evaluation  Patient identified by MRN, date of birth, ID band Patient awake    Reviewed: Allergy & Precautions, H&P , NPO status , Patient's Chart, lab work & pertinent test results, reviewed documented beta blocker date and time   Airway Mallampati: II  TM Distance: >3 FB Neck ROM: full    Dental no notable dental hx.    Pulmonary neg pulmonary ROS, former smoker   Pulmonary exam normal breath sounds clear to auscultation       Cardiovascular Exercise Tolerance: Good hypertension, negative cardio ROS  Rhythm:regular Rate:Normal     Neuro/Psych  Neuromuscular disease  negative psych ROS   GI/Hepatic Neg liver ROS,GERD  Medicated,,  Endo/Other  negative endocrine ROS    Renal/GU Renal disease  negative genitourinary   Musculoskeletal   Abdominal   Peds  Hematology negative hematology ROS (+)   Anesthesia Other Findings   Reproductive/Obstetrics negative OB ROS                             Anesthesia Physical Anesthesia Plan  ASA: 2  Anesthesia Plan: General and General LMA   Post-op Pain Management:    Induction:   PONV Risk Score and Plan: Ondansetron  Airway Management Planned:   Additional Equipment:   Intra-op Plan:   Post-operative Plan:   Informed Consent: I have reviewed the patients History and Physical, chart, labs and discussed the procedure including the risks, benefits and alternatives for the proposed anesthesia with the patient or authorized representative who has indicated his/her understanding and acceptance.     Dental Advisory Given  Plan Discussed with: CRNA  Anesthesia Plan Comments:        Anesthesia Quick Evaluation

## 2022-02-08 NOTE — Transfer of Care (Signed)
Immediate Anesthesia Transfer of Care Note  Patient: Blake Solis  Procedure(s) Performed: SCROTUM EXPLORATION- excision of cyst (Scrotum)  Patient Location: PACU  Anesthesia Type:General  Level of Consciousness: awake, alert , and patient cooperative  Airway & Oxygen Therapy: Patient Spontanous Breathing  Post-op Assessment: Report given to RN, Post -op Vital signs reviewed and stable, and Patient moving all extremities X 4  Post vital signs: Reviewed and stable  Last Vitals:  Vitals Value Taken Time  BP 126/74 02/08/22 1434  Temp    Pulse 50 02/08/22 1436  Resp 10 02/08/22 1435  SpO2 97 % 02/08/22 1436  Vitals shown include unvalidated device data.  Last Pain:  Vitals:   02/08/22 1219  TempSrc: Oral  PainSc: 3          Complications: No notable events documented.

## 2022-02-08 NOTE — Anesthesia Procedure Notes (Signed)
Procedure Name: LMA Insertion Date/Time: 02/08/2022 1:50 PM  Performed by: Jonna Munro, CRNAPre-anesthesia Checklist: Emergency Drugs available, Patient identified, Suction available, Patient being monitored and Timeout performed Patient Re-evaluated:Patient Re-evaluated prior to induction Oxygen Delivery Method: Circle system utilized Preoxygenation: Pre-oxygenation with 100% oxygen Induction Type: IV induction LMA: LMA inserted LMA Size: 4.0 Number of attempts: 1 Placement Confirmation: positive ETCO2, CO2 detector and breath sounds checked- equal and bilateral Tube secured with: Tape Dental Injury: Teeth and Oropharynx as per pre-operative assessment

## 2022-02-08 NOTE — Interval H&P Note (Signed)
History and Physical Interval Note:  02/08/2022 12:37 PM  Blake Solis  has presented today for surgery, with the diagnosis of sebaceous cyst on scrotum.  The various methods of treatment have been discussed with the patient and family. After consideration of risks, benefits and other options for treatment, the patient has consented to  Procedure(s): SCROTUM EXPLORATION- excision of cyst (N/A) as a surgical intervention.  The patient's history has been reviewed, patient examined, no change in status, stable for surgery.  I have reviewed the patient's chart and labs.  Questions were answered to the patient's satisfaction.     Nicolette Bang

## 2022-02-08 NOTE — Op Note (Signed)
Preoperative diagnosis: scrotal sebaceous cyst   Postoperative diagnosis: Same   Procedure: 1. Excision of scrotal sebaceous cyst   Attending: Nicolette Bang, MD   Anesthesia: General   History of blood loss: Minimal   Antibiotics: ancef   Drains: none   Specimens: 1. Scrotal sebaceous cyst     Findings: 4cm x 3cm scrotal sebaceous cyst on posterior right lateral scrotal wall   Indications: Patient is a 66 year old male with a history of a scrotal sebaceous cyst that was growing in size and causing him pain with ambulation.  We discussed the treatment options including observation versus excision after discussing treatment options he proceed with excision.    Procedure in detail: Prior to procedure consent was obtained.  Patient was brought to the operating room and a brief timeout was done to ensure correct patient, correct procedure, correct site.  General anesthesia was administered and patient was placed in supine position.  His genitalia was then prepped and draped in usual sterile fashion.  A 5 cm elliptical incision was made around the sebaceous cyst.  We dissected under the sebaceous cyst and removed the surrounding scrotal skin. We then obtained hemostasis with electrocautery. . The skin was then closed with 3-0 vicryl in a running fashion. A dressing was then applied to the incision.  We then placed a scrotal fluff and this then concluded the procedure which was well tolerated by the patient.   Complications: None   Condition: Stable, extubated, transferred to PACU.   Plan: Patient is to be discharged home.  He is to follow up in 2 weeks for wound check.

## 2022-02-09 NOTE — Anesthesia Postprocedure Evaluation (Signed)
Anesthesia Post Note  Patient: Blake Solis  Procedure(s) Performed: SCROTUM EXPLORATION- excision of cyst (Scrotum)  Patient location during evaluation: Phase II Anesthesia Type: General Level of consciousness: awake Pain management: pain level controlled Vital Signs Assessment: post-procedure vital signs reviewed and stable Respiratory status: spontaneous breathing and respiratory function stable Cardiovascular status: blood pressure returned to baseline and stable Postop Assessment: no headache and no apparent nausea or vomiting Anesthetic complications: no Comments: Late entry   No notable events documented.   Last Vitals:  Vitals:   02/08/22 1449 02/08/22 1509  BP:  (!) 161/90  Pulse: (!) 51 (!) 50  Resp: 18 18  Temp:  36.8 C  SpO2: 95% 96%    Last Pain:  Vitals:   02/09/22 1337  TempSrc:   PainSc: [name]

## 2022-02-10 LAB — SURGICAL PATHOLOGY

## 2022-02-15 ENCOUNTER — Telehealth: Payer: Self-pay

## 2022-02-15 NOTE — Telephone Encounter (Signed)
Patient called to ask if drainage from scrotal incision post op is normal. Reviewed symptoms with Dr. Alyson Ingles- per Dr. Alyson Ingles patient would have drainage several weeks after surgery and can place bacitracin on incision site if patient would like. Patient voiced understanding.  Denies any fever.

## 2022-02-16 ENCOUNTER — Encounter (HOSPITAL_COMMUNITY): Payer: Self-pay | Admitting: Urology

## 2022-02-22 ENCOUNTER — Telehealth: Payer: Self-pay

## 2022-02-22 NOTE — Telephone Encounter (Signed)
Patient states that surgical site is swollen and now draining blood. Patient states he has to change is bandages 3-4 times a day.  Pt. Scheduled to f/u on 12/6.  Please advise.

## 2022-02-24 ENCOUNTER — Ambulatory Visit (INDEPENDENT_AMBULATORY_CARE_PROVIDER_SITE_OTHER): Payer: BC Managed Care – PPO | Admitting: Urology

## 2022-02-24 VITALS — BP 112/64 | HR 62

## 2022-02-24 DIAGNOSIS — L729 Follicular cyst of the skin and subcutaneous tissue, unspecified: Secondary | ICD-10-CM

## 2022-02-24 MED ORDER — SULFAMETHOXAZOLE-TRIMETHOPRIM 800-160 MG PO TABS: 1.0000 | ORAL_TABLET | Freq: Two times a day (BID) | ORAL | 0 refills | Status: DC

## 2022-02-24 MED ORDER — BACITRACIN 500 UNIT/GM EX OINT: 1.0000 | TOPICAL_OINTMENT | Freq: Two times a day (BID) | CUTANEOUS | 0 refills | Status: DC

## 2022-02-24 NOTE — Progress Notes (Signed)
02/24/2022 4:08 PM   Blake Solis [birth year] [id number]  Referring provider: Practice, [facility] [place],  [place] [id number]  Followup cyst excision   HPI: Blake Solis is a 30NM here for followup and scrotal cyst excision. He has mild drainage from incision. He has mild scrotal pain. No fevers. No other complaints today   PMH: Past Medical History:  Diagnosis Date   Arthritis    Bronchitis, allergic    Essential hypertension    On several medications.   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   Tick bite 01/26/2020    Surgical History: Past Surgical History:  Procedure Laterality Date   CARDIAC CATHETERIZATION  04/04/2002   Images reviewed: Normal coronaries   COLONOSCOPY N/A 09/06/2019   Procedure: COLONOSCOPY;  Surgeon: Rogene Houston, MD;  [facility];  Service: Endoscopy;  Laterality: N/A;  135   CYST EXCISION Right    cyst removed from arm   ELBOW SURGERY Left    HIP ARTHROPLASTY Right    KIDNEY DONATION Left    Was donor for his brother   KNEE ARTHROSCOPY Right 2013   lft ulnar nerve removed     decompression   MASS EXCISION Left 09/14/2021   Procedure: EXCISION SOFT TISSUE MASS LEFT FOOT;  Surgeon: Jobe Igo, DPM;  Location: [place];  Service: Podiatry;  Laterality: Left;  MAC WITH LOCAL   NEPHRECTOMY Left    left, donated to his brother, EF 55-60%...   POLYPECTOMY  09/06/2019   Procedure: POLYPECTOMY;  Surgeon: Rogene Houston, MD;  [facility];  Service: Endoscopy;;   SCROTAL EXPLORATION N/A 01/01/2021   Procedure: SCROTUM EXPLORATION- excision of scrotal sebacous cyst;  Surgeon: Cleon Gustin, MD;  [facility];  Service: Urology;  Laterality: N/A;    SCROTAL EXPLORATION N/A 02/08/2022   Procedure: SCROTUM EXPLORATION- excision of cyst;  Surgeon: Cleon Gustin, MD;  [facility];  Service: Urology;  Laterality: N/A;   SHOULDER ARTHROSCOPY WITH ROTATOR CUFF REPAIR AND SUBACROMIAL DECOMPRESSION Right 10/14/2017   Procedure: RIGHT SHOULDER ARTHROSCOPY WITH EXTENSIVE DEBRIDEMENT, SUBACROMIAL DECOMPRESSION, DISTAL CLAVICLE EXCISION AND BICEPS TENODYSIS;  Surgeon: Leandrew Koyanagi, MD;  Location: [place];  Service: Orthopedics;  Laterality: Right;   TOTAL HIP ARTHROPLASTY Right 06/07/2012   Procedure: TOTAL HIP ARTHROPLASTY ANTERIOR APPROACH;  Surgeon: Marybelle Killings, MD;  Location: [place];  Service: Orthopedics;  Laterality: Right;  Right Total Hip Arthroplasty-Anterior Approach   TRANSTHORACIC ECHOCARDIOGRAM  04/28/2020   [facility]) EF 65 to 70%.  No or WMA.  GR 1 DD.  Mildly thickened aortic valve.  Mild to moderately dilated left atrium.  Normal RV size and function.  Mild RA dilation.:   ULNAR NERVE TRANSPOSITION Left 11/13/2020   Procedure: left elbow ulnar nerve neurolysis, ganglion cyst removal;  Surgeon: Leandrew Koyanagi, MD;  Location: [place];  Service: Orthopedics;  Laterality: Left;    Home Medications:  Allergies as of 02/24/2022   No Known Allergies      Medication List        Accurate as of February 24, 2022  4:08 PM. If you have any questions, ask your nurse or doctor.  acetaminophen 500 MG tablet Commonly known as: TYLENOL Take 500 mg by mouth every 6 (six) hours as needed (pain.).   amLODipine 10 MG tablet Commonly known as: NORVASC Take 10 mg by mouth in the morning.   doxycycline 100 MG capsule Commonly known as: VIBRAMYCIN Take 1 capsule (100 mg total) by mouth 2 (two) times daily.   HYDROcodone-acetaminophen 5-325 MG tablet Commonly known as: Norco Take 1 tablet by mouth every 6 (six) hours as needed for moderate pain.   losartan 100 MG tablet Commonly  known as: COZAAR Take 100 mg by mouth in the morning.   mirabegron ER 25 MG Tb24 tablet Commonly known as: MYRBETRIQ Take 1 tablet (25 mg total) by mouth daily.   pantoprazole 40 MG tablet Commonly known as: PROTONIX Take 1 tablet (40 mg total) by mouth daily.   pregabalin 50 MG capsule Commonly known as: LYRICA Take 50 mg by mouth in the morning.   silodosin 8 MG Caps capsule Commonly known as: RAPAFLO Take 1 capsule (8 mg total) by mouth at bedtime. What changed: when to take this   sulfamethoxazole-trimethoprim 800-160 MG tablet Commonly known as: BACTRIM DS Take 1 tablet by mouth every 12 (twelve) hours.        Allergies: No Known Allergies  Family History: Family History  Problem Relation Age of Onset   Colon cancer Brother 53   Neurologic Disorder Mother        [name] - by report   Liver disease Father    Other Sister 23       Natural causes   Pancreatic cancer Brother 22       Unknown   Diabetes Mellitus II Brother        Died from DM-Coma @ 79   Diabetic kidney disease Brother 56       End-stage- s/p Txplant ([place] was the Donor)   Lung cancer Brother 45   Other Sister 69   Healthy Sister        He does not know much details   Arthritis Sister    Diabetes type II Sister    Arthritis Sister    Stomach cancer Sister     Social History:  reports that he quit smoking about 23 years ago. His smoking use included cigarettes. He has a 40.00 pack-year smoking history. He has never used smokeless tobacco. He reports that he does not drink alcohol and does not use drugs.  ROS: All other review of systems were reviewed and are negative except what is noted above in HPI  Physical Exam: BP 112/64   Pulse 62   Constitutional:  Alert and oriented, No acute distress. HEENT: [place] AT, moist mucus membranes.  Trachea midline, no masses. Cardiovascular: No clubbing, cyanosis, or edema. Respiratory: Normal respiratory effort, no increased work of  breathing. GI: Abdomen is soft, nontender, nondistended, no abdominal masses GU: No CVA tenderness.  Lymph: No cervical or inguinal lymphadenopathy. Skin: No rashes, bruises or suspicious lesions. Neurologic: Grossly intact, no focal deficits, moving all 4 extremities. Psychiatric: Normal mood and affect.  Laboratory Data: Lab Results  Component Value Date   WBC 6.2 09/02/2016   HGB 15.3 09/02/2016   HCT 45.0 09/02/2016   MCV 88.9 09/02/2016   PLT 136 (L) 09/02/2016    Lab Results  Component Value Date   CREATININE 1.23 02/05/2022    No results found for: "PSA"  No results found for: "TESTOSTERONE"  No results found for: "HGBA1C"  Urinalysis  Component Value Date/Time   COLORURINE YELLOW 06/02/2012 1326   APPEARANCEUR Clear 01/22/2022 0909   LABSPEC 1.015 06/02/2012 1326   PHURINE 8.0 06/02/2012 1326   GLUCOSEU Negative 01/22/2022 0909   HGBUR NEGATIVE 06/02/2012 1326   BILIRUBINUR Negative 01/22/2022 0909   KETONESUR NEGATIVE 06/02/2012 1326   PROTEINUR Negative 01/22/2022 0909   PROTEINUR NEGATIVE 06/02/2012 1326   UROBILINOGEN 0.2 12/21/2021 0940   UROBILINOGEN 1.0 06/02/2012 1326   NITRITE Negative 01/22/2022 0909   NITRITE NEGATIVE 06/02/2012 1326   LEUKOCYTESUR Negative 01/22/2022 0909    Lab Results  Component Value Date   LABMICR Comment 01/22/2022   WBCUA None seen 12/23/2020   LABEPIT None seen 12/23/2020   BACTERIA None seen 12/23/2020    Pertinent Imaging:  No results found for this or any previous visit.  No results found for this or any previous visit.  No results found for this or any previous visit.  No results found for this or any previous visit.  No results found for this or any previous visit.  No valid procedures specified. No results found for this or any previous visit.  No results found for this or any previous visit.   Assessment & Plan:    1. Scrotal cyst -bacitracin BID to incision. -followup 1 month   No  follow-ups on file.  Nicolette Bang, MD  [facility] [place]

## 2022-02-25 ENCOUNTER — Ambulatory Visit: Payer: Self-pay | Admitting: Neurology

## 2022-03-10 ENCOUNTER — Ambulatory Visit (INDEPENDENT_AMBULATORY_CARE_PROVIDER_SITE_OTHER): Payer: BC Managed Care – PPO | Admitting: Urology

## 2022-03-10 ENCOUNTER — Encounter: Payer: Self-pay | Admitting: Urology

## 2022-03-10 VITALS — BP 104/67 | HR 75

## 2022-03-10 DIAGNOSIS — L729 Follicular cyst of the skin and subcutaneous tissue, unspecified: Secondary | ICD-10-CM

## 2022-03-10 MED ORDER — AMOXICILLIN-POT CLAVULANATE 875-125 MG PO TABS: 1.0000 | ORAL_TABLET | Freq: Two times a day (BID) | ORAL | 0 refills | Status: DC

## 2022-03-10 MED ORDER — CLOTRIMAZOLE-BETAMETHASONE 1-0.05 % EX CREA: 1.0000 | TOPICAL_CREAM | Freq: Two times a day (BID) | CUTANEOUS | 0 refills | Status: DC

## 2022-03-10 NOTE — Patient Instructions (Signed)
Epidermoid Cyst  An epidermoid cyst, also known as epidermal cyst, is a sac made of skin tissue. The sac contains a substance called keratin. Keratin is a protein that is normally secreted through the hair follicles. When keratin becomes trapped in the top layer of skin (epidermis), it can form an epidermoid cyst. Epidermoid cysts can be found anywhere on your body. These cysts are usually harmless (benign), and they may not cause symptoms unless they become inflamed or infected. What are the causes? This condition may be caused by: A blocked hair follicle. A hair that curls and re-enters the skin instead of growing straight out of the skin (ingrown hair). A blocked pore. Irritated skin. An injury to the skin. Certain conditions that are passed along from parent to child (inherited). Human papillomavirus (HPV). This happens rarely when cysts occur on the bottom of the feet. Long-term (chronic) sun damage to the skin. What increases the risk? The following factors may make you more likely to develop an epidermoid cyst: Having acne. Being male. Having an injury to the skin. Being past puberty. Having certain rare genetic disorders. What are the signs or symptoms? The only symptom of this condition may be a small, painless lump underneath the skin. When an epidermal cyst ruptures, it may become inflamed. True infection in cysts is rare. Symptoms may include: Redness. Inflammation. Tenderness. Warmth. Keratin draining from the cyst. Keratin is grayish-white, bad-smelling substance. Pus draining from the cyst. How is this diagnosed? This condition is diagnosed with a physical exam. In some cases, you may have a sample of tissue (biopsy) taken from your cyst to be examined under a microscope or tested for bacteria. You may be referred to a health care provider who specializes in skin care (dermatologist). How is this treated? If a cyst becomes inflamed, treatment may include: Opening and  draining the cyst, done by a health care provider. After draining, minor surgery to remove the rest of the cyst may be done. Taking antibiotic medicine. Having injections of medicines (steroids) that help to reduce inflammation. Having surgery to remove the cyst. Surgery may be done if the cyst: Becomes large. Bothers you. Has a chance of turning into cancer. Do not try to open a cyst yourself. Follow these instructions at home: Medicines If you were prescribed an antibiotic medicine, take it it as told by your health care provider. Do not stop using the antibiotic even if you start to feel better. Take over-the-counter and prescription medicines only as told by your health care provider. General instructions Keep the area around your cyst clean and dry. Wear loose, dry clothing. Avoid touching your cyst. Check your cyst every day for signs of infection. Check for: Redness, swelling, or pain. Fluid or blood. Warmth. Pus or a bad smell. Keep all follow-up visits. This is important. How is this prevented? Wear clean, dry, clothing. Avoid wearing tight clothing. Keep your skin clean and dry. Take showers or baths every day. Contact a health care provider if: Your cyst develops symptoms of infection. Your condition is not improving or is getting worse. You develop a cyst that looks different from other cysts you have had. You have a fever. Get help right away if: Redness spreads from the cyst into the surrounding area. Summary An epidermoid cyst is a sac made of skin tissue. These cysts are usually harmless (benign), and they may not cause symptoms unless they become inflamed. If a cyst becomes inflamed, treatment may include surgery to open and drain the   cyst, or to remove it. Treatment may also include medicines by mouth or through an injection. Take over-the-counter and prescription medicines only as told by your health care provider. If you were prescribed an antibiotic medicine,  take it as told by your health care provider. Do not stop using the antibiotic even if you start to feel better. Contact a health care provider if your condition is not improving or is getting worse. Keep all follow-up visits as told by your health care provider. This is important. This information is not intended to replace advice given to you by your health care provider. Make sure you discuss any questions you have with your health care provider. Document Revised: 06/13/2019 Document Reviewed: 06/13/2019 Elsevier Patient Education  2023 Elsevier Inc.  

## 2022-03-10 NOTE — Progress Notes (Signed)
03/10/2022 10:16 AM   Blake Solis 1955-06-14 [id number]  Referring provider: Practice, [place],  Blake Solis  Followup scrotal wound   HPI: Mr Blake Solis is a 66yo here for followup for a scrotal wound. Drainage from the wound has decreased since last visit. No worsening swelling or erythema. He is applying bacitracin BID to wound   PMH: Past Medical History:  Diagnosis Date   Arthritis    Bronchitis, allergic    Essential hypertension    On several medications.   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   POLYPECTOMY  09/06/2019   Procedure: POLYPECTOMY;  Surgeon: Blake Houston, MD;  [facility];  Service: Endoscopy;;   SCROTAL EXPLORATION N/A 01/01/2021   Procedure: SCROTUM EXPLORATION- excision of scrotal sebacous cyst;  Surgeon: Blake Gustin, MD;  [facility];  Service:  Urology;  Laterality: N/A;   SCROTAL EXPLORATION N/A 02/08/2022   Procedure: SCROTUM EXPLORATION- excision of cyst;  Surgeon: Blake Gustin, MD;  [facility];  Service: Urology;  Laterality: N/A;   SHOULDER ARTHROSCOPY WITH ROTATOR CUFF REPAIR AND SUBACROMIAL DECOMPRESSION Right 10/14/2017   Procedure: RIGHT SHOULDER ARTHROSCOPY WITH EXTENSIVE DEBRIDEMENT, SUBACROMIAL DECOMPRESSION, DISTAL CLAVICLE EXCISION AND BICEPS TENODYSIS;  Surgeon: Blake Koyanagi, MD;  Location: [place];  Service: Orthopedics;  Laterality: Right;   TOTAL HIP ARTHROPLASTY Right 06/07/2012   Procedure: TOTAL HIP ARTHROPLASTY ANTERIOR APPROACH;  Surgeon: Blake Killings, MD;  Location: [place];  Service: Orthopedics;  Laterality: Right;  Right Total Hip Arthroplasty-Anterior Approach   TRANSTHORACIC ECHOCARDIOGRAM  04/28/2020   [facility]) EF 65 to 70%.  No or WMA.  GR 1 DD.  Mildly thickened aortic valve.  Mild to moderately dilated left atrium.  Normal RV size and function.  Mild RA dilation.:   ULNAR NERVE TRANSPOSITION Left 11/13/2020   Procedure: left elbow ulnar nerve neurolysis, ganglion cyst removal;  Surgeon: Blake Koyanagi, MD;  Location: [place];  Service: Orthopedics;  Laterality: Left;    Home Medications:  Allergies as of 03/10/2022   No Known Allergies      Medication List        Accurate as of March 10, 2022 10:16 AM. If you have any questions, ask your  nurse or doctor.          acetaminophen 500 MG tablet Commonly known as: TYLENOL Take 500 mg by mouth every 6 (six) hours as needed (pain.).   amLODipine 10 MG tablet Commonly known as: NORVASC Take 10 mg by mouth in the morning.   amoxicillin-clavulanate 875-125 MG tablet Commonly known as: AUGMENTIN Take 1 tablet by mouth every 12 (twelve) hours. Started by: Blake Bang, MD   bacitracin 500 UNIT/GM ointment Apply 1 Application topically 2 (two) times daily.    clotrimazole-betamethasone cream Commonly known as: Lotrisone Apply 1 Application topically 2 (two) times daily. Started by: Blake Bang, MD   doxycycline 100 MG capsule Commonly known as: VIBRAMYCIN Take 1 capsule (100 mg total) by mouth 2 (two) times daily.   HYDROcodone-acetaminophen 5-325 MG tablet Commonly known as: Norco Take 1 tablet by mouth every 6 (six) hours as needed for moderate pain.   losartan 100 MG tablet Commonly known as: COZAAR Take 100 mg by mouth in the morning.   mirabegron ER 25 MG Tb24 tablet Commonly known as: MYRBETRIQ Take 1 tablet (25 mg total) by mouth daily.   pantoprazole 40 MG tablet Commonly known as: PROTONIX Take 1 tablet (40 mg total) by mouth daily.   pregabalin 50 MG capsule Commonly known as: LYRICA Take 50 mg by mouth in the morning.   silodosin 8 MG Caps capsule Commonly known as: RAPAFLO Take 1 capsule (8 mg total) by mouth at bedtime. What changed: when to take this   sulfamethoxazole-trimethoprim 800-160 MG tablet Commonly known as: BACTRIM DS Take 1 tablet by mouth every 12 (twelve) hours.   sulfamethoxazole-trimethoprim 800-160 MG tablet Commonly known as: BACTRIM DS Take 1 tablet by mouth every 12 (twelve) hours.        Allergies: No Known Allergies  Family History: Family History  Problem Relation Age of Onset   Colon cancer Brother 9   Neurologic Disorder Mother        Blake Solis - by report   Liver disease Father    Other Sister 43       Natural causes   Pancreatic cancer Brother 60       Unknown   Diabetes Mellitus II Brother        Died from DM-Coma @ 22   Diabetic kidney disease Brother 17       End-stage- s/p Txplant (Blake Solis was the Donor)   Lung cancer Brother 97   Other Sister 58   Healthy Sister        He does not know much details   Arthritis Sister    Diabetes type II Sister    Arthritis Sister    Stomach cancer Sister     Social History:  reports that he quit smoking  about 23 years ago. His smoking use included cigarettes. He has a 40.00 pack-year smoking history. He has never used smokeless tobacco. He reports that he does not drink alcohol and does not use drugs.  ROS: All other review of systems were reviewed and are negative except what is noted above in HPI  Physical Exam: BP 104/67   Pulse 75   Constitutional:  Alert and oriented, No acute distress. HEENT: [place] AT, moist mucus membranes.  Trachea midline, no masses. Cardiovascular: No clubbing, cyanosis, or edema. Respiratory: Normal respiratory effort, no increased work of breathing. GI: Abdomen is soft, nontender, nondistended, no abdominal masses GU: No CVA tenderness.  Lymph: No cervical or inguinal lymphadenopathy. Skin: No rashes, bruises  or suspicious lesions. Neurologic: Grossly intact, no focal deficits, moving all 4 extremities. Psychiatric: Normal mood and affect.  Laboratory Data: Lab Results  Component Value Date   WBC 6.2 09/02/2016   HGB 15.3 09/02/2016   HCT 45.0 09/02/2016   MCV 88.9 09/02/2016   PLT 136 (L) 09/02/2016    Lab Results  Component Value Date   CREATININE 1.23 02/05/2022    No results found for: "PSA"  No results found for: "TESTOSTERONE"  No results found for: "HGBA1C"  Urinalysis    Component Value Date/Time   COLORURINE YELLOW 06/02/2012 1326   APPEARANCEUR Clear 01/22/2022 0909   LABSPEC 1.015 06/02/2012 1326   PHURINE 8.0 06/02/2012 1326   GLUCOSEU Negative 01/22/2022 0909   HGBUR NEGATIVE 06/02/2012 1326   BILIRUBINUR Negative 01/22/2022 0909   [place] 06/02/2012 1326   PROTEINUR Negative 01/22/2022 0909   PROTEINUR NEGATIVE 06/02/2012 1326   UROBILINOGEN 0.2 12/21/2021 0940   UROBILINOGEN 1.0 06/02/2012 1326   NITRITE Negative 01/22/2022 0909   NITRITE NEGATIVE 06/02/2012 1326   LEUKOCYTESUR Negative 01/22/2022 0909    Lab Results  Component Value Date   LABMICR Comment 01/22/2022   WBCUA None seen 12/23/2020    LABEPIT None seen 12/23/2020   BACTERIA None seen 12/23/2020    Pertinent Imaging:  No results found for this or any previous visit.  No results found for this or any previous visit.  No results found for this or any previous visit.  No results found for this or any previous visit.  No results found for this or any previous visit.  No valid procedures specified. No results found for this or any previous visit.  No results found for this or any previous visit.   Assessment & Plan:    1. Scrotal cyst Augmentin 875 Clotrimazole BID - Urinalysis, Routine w reflex microscopic   Return in about 3 weeks (around 03/31/2022).  Blake Bang, MD  [facility] [place]

## 2022-03-23 ENCOUNTER — Telehealth: Payer: Self-pay | Admitting: *Deleted

## 2022-03-23 ENCOUNTER — Other Ambulatory Visit: Payer: Self-pay | Admitting: Gastroenterology

## 2022-03-23 DIAGNOSIS — K219 Gastro-esophageal reflux disease without esophagitis: Secondary | ICD-10-CM

## 2022-03-23 MED ORDER — PANTOPRAZOLE SODIUM 40 MG PO TBEC: 40.0000 mg | DELAYED_RELEASE_TABLET | Freq: Every day | ORAL | 3 refills | Status: DC

## 2022-03-23 NOTE — Telephone Encounter (Signed)
Received refill request for pantoprazole. Last OV was 12/07/2021

## 2022-03-23 NOTE — Telephone Encounter (Signed)
Noted  

## 2022-03-24 ENCOUNTER — Ambulatory Visit: Payer: BC Managed Care – PPO | Admitting: Nurse Practitioner

## 2022-03-25 ENCOUNTER — Ambulatory Visit (INDEPENDENT_AMBULATORY_CARE_PROVIDER_SITE_OTHER): Payer: BC Managed Care – PPO | Admitting: Orthopaedic Surgery

## 2022-03-25 ENCOUNTER — Ambulatory Visit (INDEPENDENT_AMBULATORY_CARE_PROVIDER_SITE_OTHER): Payer: BC Managed Care – PPO

## 2022-03-25 ENCOUNTER — Ambulatory Visit: Payer: BC Managed Care – PPO | Admitting: Orthopaedic Surgery

## 2022-03-25 ENCOUNTER — Encounter: Payer: Self-pay | Admitting: Orthopaedic Surgery

## 2022-03-25 VITALS — Ht 70.0 in | Wt 234.0 lb

## 2022-03-25 DIAGNOSIS — M1611 Unilateral primary osteoarthritis, right hip: Secondary | ICD-10-CM | POA: Insufficient documentation

## 2022-03-25 DIAGNOSIS — M25551 Pain in right hip: Secondary | ICD-10-CM | POA: Diagnosis not present

## 2022-03-25 MED ORDER — PREDNISONE 5 MG (21) PO TBPK: ORAL_TABLET | ORAL | 0 refills | Status: DC

## 2022-03-25 NOTE — Progress Notes (Signed)
Office Visit Note   Patient: Blake Solis           Date of Birth: 1955/10/25           MRN: [id number] Visit Date: 03/25/2022              Requested by: Practice, [place],  [place] [address] PCP: Practice, [facility]   Assessment & Plan: Visit Diagnoses:  1. Pain in right hip   2. Unilateral primary osteoarthritis, right hip     Plan: Will place patient on prednisone 5 mg Dosepak.  Recheck 5 weeks.  Will obtain an arthritis panel with his history of multiple joint problems.  Recheck 5 weeks.  Follow-Up Instructions: Return in about 5 weeks (around 04/29/2022).   Orders:  Orders Placed This Encounter  Procedures   XR HIP UNILAT W OR W/O PELVIS 2-3 VIEWS RIGHT   Meds ordered this encounter  Medications   predniSONE (STERAPRED UNI-PAK 21 TAB) 5 MG (21) TBPK tablet    Sig: Take 6,5,4,3,2,1 one tablet less each day with food    Dispense:  21 tablet    Refill:  0      Procedures: No procedures performed   Clinical Data: No additional findings.   Subjective: Chief Complaint  Patient presents with   Left Hip - Pain    HPI 67 year old male returns post right total of arthroplasty 2014 for AVN.  He is having left hip pain and had x-rays done at [facility] that showed joint space narrowing no definite AVN changes left hip and 3 mm lucent area around the femoral stem on the right side.  He states occasionally has been at work a lot he will have some right thigh aching but does not have it every day.  Patient only has 1 kidney has to avoid anti-inflammatories occasionally uses Tylenol.  Noticed groin pain on the left that radiates into the left thigh.  No numbness or tingling in his feet.  Onset was about a month ago.  Previous olecranon bursa excision left elbow with revision surgery x 2 ultimately ulnar nerve neurolysis done by Dr.Xu for a football injury to his left elbow with spurring.  He states his elbow is doing well.  Patient has had some pain  in his left knee without swelling or catching or locking.  Previous right shoulder rotator cuff debridement and biceps tenodesis.  Review of Systems all systems noncontributory to HPI.   Objective: Vital Signs: Ht _0  (1.778 m)   Wt 234 lb (106.1 kg)   BMI 33.58 kg/m   Physical Exam Constitutional:      Appearance: He is well-developed.  HENT:     Head: Normocephalic and atraumatic.     Right Ear: External ear normal.     Left Ear: External ear normal.  Eyes:     Pupils: Pupils are equal, round, and reactive to light.  Neck:     Thyroid: No thyromegaly.     Trachea: No tracheal deviation.  Cardiovascular:     Rate and Rhythm: Normal rate.  Pulmonary:     Effort: Pulmonary effort is normal.     Breath sounds: No wheezing.  Abdominal:     General: Bowel sounds are normal.     Palpations: Abdomen is soft.  Musculoskeletal:     Cervical back: Neck supple.  Skin:    General: Skin is warm and dry.     Capillary Refill: Capillary refill takes less than  2 seconds.  Neurological:     Mental Status: He is alert and oriented to person, place, and time.  Psychiatric:        Behavior: Behavior normal.        Thought Content: Thought content normal.        Judgment: Judgment normal.     Ortho Exam healed incision left elbow no olecranon bursitis.  Mild crepitus with elbow range of motion supination pronation.  10 degrees internal rotation left hip with reproduction of groin and thigh pain.  External rotation 20 degrees.  No hip flexion contracture.  Pain with attempted figure-of-four.  No lumbar pain no sciatic notch tenderness no trochanteric bursal tenderness.  Opposite right hip internal/external rotation nonpainful.  Positive left Trendelenburg gait.  Specialty Comments:  No specialty comments available.  Imaging: XR HIP UNILAT W OR W/O PELVIS 2-3 VIEWS RIGHT  Result Date: 03/25/2022 Standing AP pelvis frog-leg right hip demonstrate some proximal to 3 mm lucency around  the stem.  Leg lengths are equal.  No definite AVN left hip.  Joint space narrowing again noted. Impression: Small lucency around right femoral stem suggesting loosening without subsidence.  Moderate left hip OA.    PMFS History: Patient Active Problem List   Diagnosis Date Noted   Unilateral primary osteoarthritis, right hip 03/25/2022   Symptomatic bradycardia 12/04/2021   Scrotal sebaceous cyst 12/24/2020   Ganglion of left elbow 10/28/2020   Chest pain with high risk for cardiac etiology 04/29/2020   Renal cyst 01/21/2020   Benign prostatic hyperplasia with urinary obstruction 01/21/2020   Nocturia 01/21/2020   S/P arthroscopy of right shoulder 11/01/2017   Superior glenoid labrum lesion of right shoulder    Arthrosis of right acromioclavicular joint    Tendinopathy of rotator cuff, right    Impingement syndrome of right shoulder    Nontraumatic tear of right supraspinatus tendon 09/29/2017   Cubital tunnel syndrome on left 05/13/2017   Avascular necrosis of right femoral head ([place]) 06/07/2012    Class: Diagnosis of   Essential hypertension 07/08/2010   Hyperlipidemia due to dietary fat intake 07/08/2010   Prostatitis 07/08/2010   Degenerative disc disease 07/08/2010   Hydrocele of testis 07/08/2010   Obesity (BMI 35.0-39.9 without comorbidity) 09/08/2009   Past Medical History:  Diagnosis Date   Arthritis    Bronchitis, allergic    Essential hypertension    On several medications.   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   Tick bite 01/26/2020    Family History  Problem Relation Age of Onset   Colon cancer Brother 59   Neurologic Disorder Mother        [name] - by report   Liver disease Father    Other Sister 19       Natural  causes   Pancreatic cancer Brother 74       Unknown   Diabetes Mellitus II Brother        Died from DM-Coma @ 69   Diabetic kidney disease Brother 50       End-stage- s/p Txplant ([facility] was the Donor)   Lung cancer Brother 14   Other Sister 47   Healthy Sister        He does not know much  details   Arthritis Sister    Diabetes type II Sister    Arthritis Sister    Stomach cancer Sister     Past Surgical History:  Procedure Laterality Date   CARDIAC CATHETERIZATION  04/04/2002   Images reviewed: Normal coronaries   COLONOSCOPY N/A 09/06/2019   Procedure: COLONOSCOPY;  Surgeon: Rogene Houston, MD;  [facility];  Service: Endoscopy;  Laterality: N/A;  135   CYST EXCISION Right    cyst removed from arm   ELBOW SURGERY Left    HIP ARTHROPLASTY Right    KIDNEY DONATION Left    Was donor for his brother   KNEE ARTHROSCOPY Right 2013   lft ulnar nerve removed     decompression   MASS EXCISION Left 09/14/2021   Procedure: EXCISION SOFT TISSUE MASS LEFT FOOT;  Surgeon: Jobe Igo, DPM;  Location: [facility];  Service: Podiatry;  Laterality: Left;  MAC WITH LOCAL   NEPHRECTOMY Left    left, donated to his brother, EF 55-60%...   POLYPECTOMY  09/06/2019   Procedure: POLYPECTOMY;  Surgeon: Rogene Houston, MD;  [facility];  Service: Endoscopy;;   SCROTAL EXPLORATION N/A 01/01/2021   Procedure: SCROTUM EXPLORATION- excision of scrotal sebacous cyst;  Surgeon: Cleon Gustin, MD;  [facility];  Service: Urology;  Laterality: N/A;   SCROTAL EXPLORATION N/A 02/08/2022   Procedure: SCROTUM EXPLORATION- excision of cyst;  Surgeon: Cleon Gustin, MD;  [facility];  Service: Urology;  Laterality: N/A;   SHOULDER ARTHROSCOPY WITH ROTATOR CUFF REPAIR AND SUBACROMIAL DECOMPRESSION Right 10/14/2017   Procedure: RIGHT SHOULDER ARTHROSCOPY WITH EXTENSIVE DEBRIDEMENT, SUBACROMIAL DECOMPRESSION, DISTAL CLAVICLE EXCISION AND BICEPS  TENODYSIS;  Surgeon: Leandrew Koyanagi, MD;  Location: [facility];  Service: Orthopedics;  Laterality: Right;   TOTAL HIP ARTHROPLASTY Right 06/07/2012   Procedure: TOTAL HIP ARTHROPLASTY ANTERIOR APPROACH;  Surgeon: Marybelle Killings, MD;  Location: [facility];  Service: Orthopedics;  Laterality: Right;  Right Total Hip Arthroplasty-Anterior Approach   TRANSTHORACIC ECHOCARDIOGRAM  04/28/2020   [facility]) EF 65 to 70%.  No or WMA.  GR 1 DD.  Mildly thickened aortic valve.  Mild to moderately dilated left atrium.  Normal RV size and function.  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

## 2022-04-08 ENCOUNTER — Ambulatory Visit: Payer: BC Managed Care – PPO | Admitting: Gastroenterology

## 2022-04-09 ENCOUNTER — Ambulatory Visit: Payer: BC Managed Care – PPO | Admitting: Urology

## 2022-04-29 ENCOUNTER — Ambulatory Visit (INDEPENDENT_AMBULATORY_CARE_PROVIDER_SITE_OTHER): Payer: BC Managed Care – PPO

## 2022-04-29 ENCOUNTER — Encounter: Payer: Self-pay | Admitting: Orthopaedic Surgery

## 2022-04-29 ENCOUNTER — Telehealth: Payer: Self-pay | Admitting: Radiology

## 2022-04-29 ENCOUNTER — Ambulatory Visit (INDEPENDENT_AMBULATORY_CARE_PROVIDER_SITE_OTHER): Payer: BC Managed Care – PPO | Admitting: Orthopaedic Surgery

## 2022-04-29 ENCOUNTER — Ambulatory Visit: Payer: BC Managed Care – PPO | Admitting: Orthopaedic Surgery

## 2022-04-29 VITALS — Ht 70.0 in | Wt 234.0 lb

## 2022-04-29 DIAGNOSIS — M79605 Pain in left leg: Secondary | ICD-10-CM

## 2022-04-29 NOTE — Progress Notes (Signed)
Office Visit Note   Patient: Blake Solis           Date of Birth: 10/20/1955           MRN: [id number] Visit Date: 04/29/2022              Requested by: Practice, [address] [place],  [place] [address] PCP: [facility], [facility]   Assessment & Plan: Visit Diagnoses:  1. Pain in left leg     Plan: Discussed patient that most this does not appear to be coming from his left hip which has only mild arthritis.  Will set up for some physical therapy for his back and he can follow-up in 4 weeks.  Follow-Up Instructions: No follow-ups on file.   Orders:  Orders Placed This Encounter  Procedures   XR Lumbar Spine 2-3 Views   No orders of the defined types were placed in this encounter.     Procedures: No procedures performed   Clinical Data: No additional findings.   Subjective: Chief Complaint  Patient presents with   Left Leg - Pain    HPI patient turns ongoing pain in his left thigh that radiates laterally down the iliotibial band.  Previous right total of arthroplasty doing well and he is only having mild left groin pain.  Review of Systems no fever or chills.  Previous x-rays 2020 of his back showed some ankylosis several levels with prominent bridging spurs.   Objective: Vital Signs: Ht '5\' 10"'$  (1.778 m)   Wt 234 lb (106.1 kg)   BMI 33.58 kg/m   Physical Exam Constitutional:      Appearance: He is well-developed.  HENT:     Head: Normocephalic and atraumatic.     Right Ear: External ear normal.     Left Ear: External ear normal.  Eyes:     Pupils: Pupils are equal, round, and reactive to light.  Neck:     Thyroid: No thyromegaly.     Trachea: No tracheal deviation.  Cardiovascular:     Rate and Rhythm: Normal rate.  Pulmonary:     Effort: Pulmonary effort is normal.     Breath sounds: No wheezing.  Abdominal:     General: Bowel sounds are normal.     Palpations: Abdomen is soft.  Musculoskeletal:     Cervical back: Neck  supple.  Skin:    General: Skin is warm and dry.     Capillary Refill: Capillary refill takes less than 2 seconds.  Neurological:     Mental Status: He is alert and oriented to person, place, and time.  Psychiatric:        Behavior: Behavior normal.        Thought Content: Thought content normal.        Judgment: Judgment normal.     Ortho Exam negative straight leg raising on the left 90 degrees mild sciatic notch tenderness minimal trochanteric bursal tenderness mild discomfort left hip with internal rotation 30 degrees not extremely painful no pain with external rotation no hip flexion contracture.  Mild knee crepitus with knee flexion extension he ambulates with a negative Trendelenburg gait.  Decreased lumbar excursion.  Specialty Comments:  No specialty comments available.  Imaging: XR Lumbar Spine 2-3 Views  Result Date: 04/29/2022 AP lateral lumbar x-rays demonstrate mild long lumbar curvature to the right on AP with upper lumbar bridging osteophytes maintenance of disc space.  Some decreased lumbar lordosis with facet arthropathy.  Negative for  compression fracture. 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    PMFS History: Patient Active Problem List   Diagnosis Date Noted   Unilateral primary osteoarthritis, right hip 03/25/2022   Symptomatic bradycardia 12/04/2021   Scrotal sebaceous cyst 12/24/2020   Ganglion of left elbow 10/28/2020   Chest pain with high risk for cardiac etiology 04/29/2020   Renal cyst 01/21/2020   Benign prostatic hyperplasia with urinary obstruction 01/21/2020   Nocturia 01/21/2020   S/P arthroscopy of right shoulder 11/01/2017   Superior glenoid labrum lesion of right shoulder    Arthrosis of right acromioclavicular joint    Tendinopathy of rotator cuff, right    Impingement syndrome of right shoulder    Nontraumatic tear of right supraspinatus tendon 09/29/2017   Cubital tunnel syndrome on left 05/13/2017    Avascular necrosis of right femoral head ([place]) 06/07/2012    Class: Diagnosis of   Essential hypertension 07/08/2010   Hyperlipidemia due to dietary fat intake 07/08/2010   Prostatitis 07/08/2010   Degenerative disc disease 07/08/2010   Hydrocele of testis 07/08/2010   Obesity (BMI 35.0-39.9 without comorbidity) 09/08/2009   Past Medical History:  Diagnosis Date   Arthritis    Bronchitis, allergic    Essential hypertension    On several medications.   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   Tick bite 01/26/2020    Family History  Problem Relation Age of Onset   Colon cancer Brother 31   Neurologic Disorder Mother        [name] - by report   Liver disease Father    Other Sister 52       Natural causes   Pancreatic cancer Brother 50       Unknown   Diabetes Mellitus II Brother        Died from DM-Coma @ 49   Diabetic kidney disease Brother 33       End-stage- s/p Txplant ([facility] was the Donor)   Lung cancer Brother 29   Other Sister 60   Healthy Sister        He does not know much details   Arthritis Sister    Diabetes type II Sister    Arthritis Sister    Stomach cancer Sister     Past Surgical History:  Procedure Laterality Date   CARDIAC CATHETERIZATION  04/04/2002   Images reviewed: Normal coronaries   COLONOSCOPY N/A 09/06/2019   Procedure: COLONOSCOPY;  Surgeon: Rogene Houston, MD;  [facility];  Service: Endoscopy;  Laterality: N/A;  135   CYST EXCISION Right    cyst removed from arm   ELBOW SURGERY Left    HIP ARTHROPLASTY Right    KIDNEY DONATION Left    Was donor for his brother   KNEE ARTHROSCOPY Right 2013   lft ulnar nerve removed     decompression   MASS EXCISION Left 09/14/2021   Procedure:  EXCISION SOFT TISSUE MASS LEFT FOOT;  Surgeon: Jobe Igo, DPM;  Location: [facility];  Service: Podiatry;  Laterality: Left;  MAC WITH LOCAL   NEPHRECTOMY Left    left, donated to his brother, EF 55-60%...   POLYPECTOMY  09/06/2019   Procedure: POLYPECTOMY;  Surgeon: Rogene Houston,  MD;  [facility];  Service: Endoscopy;;   SCROTAL EXPLORATION N/A 01/01/2021   Procedure: SCROTUM EXPLORATION- excision of scrotal sebacous cyst;  Surgeon: Cleon Gustin, MD;  [facility];  Service: Urology;  Laterality: N/A;   SCROTAL EXPLORATION N/A 02/08/2022   Procedure: SCROTUM EXPLORATION- excision of cyst;  Surgeon: Cleon Gustin, MD;  [facility];  Service: Urology;  Laterality: N/A;   SHOULDER ARTHROSCOPY WITH ROTATOR CUFF REPAIR AND SUBACROMIAL DECOMPRESSION Right 10/14/2017   Procedure: RIGHT SHOULDER ARTHROSCOPY WITH EXTENSIVE DEBRIDEMENT, SUBACROMIAL DECOMPRESSION, DISTAL CLAVICLE EXCISION AND BICEPS TENODYSIS;  Surgeon: Leandrew Koyanagi, MD;  Location: [facility];  Service: Orthopedics;  Laterality: Right;   TOTAL HIP ARTHROPLASTY Right 06/07/2012   Procedure: TOTAL HIP ARTHROPLASTY ANTERIOR APPROACH;  Surgeon: Marybelle Killings, MD;  Location: [facility];  Service: Orthopedics;  Laterality: Right;  Right Total Hip Arthroplasty-Anterior Approach   TRANSTHORACIC ECHOCARDIOGRAM  04/28/2020   [facility]) EF 65 to 70%.  No or WMA.  GR 1 DD.  Mildly thickened aortic valve.  Mild to moderately dilated left atrium.  Normal RV size and function.  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

## 2022-04-29 NOTE — Telephone Encounter (Signed)
Noted. Patient came in for appointment.

## 2022-04-29 NOTE — Telephone Encounter (Signed)
Patient requests medication for OA be sent in to [facility] in [place].  Please advise.  CB [phone]

## 2022-04-30 NOTE — Addendum Note (Signed)
Addended by: Meyer Cory on: 04/30/2022 08:57 AM   Modules accepted: Orders

## 2022-06-10 ENCOUNTER — Ambulatory Visit: Payer: BC Managed Care – PPO | Admitting: Orthopaedic Surgery

## 2022-08-05 ENCOUNTER — Ambulatory Visit: Payer: BC Managed Care – PPO | Admitting: Orthopaedic Surgery

## 2022-10-07 ENCOUNTER — Encounter (HOSPITAL_BASED_OUTPATIENT_CLINIC_OR_DEPARTMENT_OTHER): Payer: Self-pay | Admitting: Podiatry

## 2022-10-07 ENCOUNTER — Other Ambulatory Visit: Payer: Self-pay

## 2022-10-07 NOTE — Progress Notes (Signed)
Spoke w/ via phone for pre-op interview---pt Lab needs dos----   I stat             Lab results------ekg 12-04-2021 epic COVID test -----patient states asymptomatic no test needed Arrive at -------830 am 10-20-2022 NPO after MN NO Solid Food.  Clear liquids from MN until---730 am Medications to take morning of surgery -----amlodipine Diabetic medication -----none Patient instructed no nail polish to be worn day of surgery Patient instructed to bring photo id and insurance card day of surgery Patient aware to have Driver (ride ) / caregiver  will arrange driver  for 24 hours after surgery  Patient Special Instructions -----none Pre-Op special Instructions -----none Patient verbalized understanding of instructions that were given at this phone interview. Patient denies shortness of breath, chest pain, fever, cough at this phone interview.  H & P not received yet ( pcp is dr Roger [facility])

## 2022-10-11 ENCOUNTER — Telehealth: Payer: Self-pay | Admitting: Cardiology

## 2022-10-11 NOTE — Telephone Encounter (Signed)
I s/w the pt and his wife and explained that thr pt is going to need an appt in office for pre op clearance. I was able to find an appt with Gaynelle Adu, NP [facility] 10/12/22 11:20. Pt and his wife are agreeable to plan of care and thanked me for the help. I will update all parties involved.

## 2022-10-11 NOTE — Telephone Encounter (Signed)
   Name: Blake Solis  DOB: [birth year]  MRN: [id number]  Primary Cardiologist: Bryan Lemma, MD  Chart reviewed as part of pre-operative protocol coverage. Because of Blake Solis's past medical history and time since last visit, he will require a follow-up in-office visit in order to better assess preoperative cardiovascular risk.  Pre-op covering staff: - Please schedule appointment and call patient to inform them. If patient already had an upcoming appointment within acceptable timeframe, please add "pre-op clearance" to the appointment notes so provider is aware. - Please contact requesting surgeon's office via preferred method (i.e, phone, fax) to inform them of need for appointment prior to surgery.   Joylene Grapes, NP  10/11/2022, 2:38 PM

## 2022-10-11 NOTE — Progress Notes (Signed)
Spoke with Dr Molly Maduro Fiftgerald MDA reviewed [facility] cardiology 01-14-2022, pt needs cardiology clearance for 10-20-2022 surgery per dr Myer Peer Fotzgerald. Called Mandy at Dr Theresia Bough office and made aware pt needs cardiac clearance for 10-20-2022 per dr Marcene Duos, mda.

## 2022-10-11 NOTE — Telephone Encounter (Signed)
   Pre-operative Risk Assessment    Patient Name: Blake Solis  DOB: [birth year] MRN: [id number]      Request for Surgical Clearance    Procedure:   Achilles Tendon Repair and Calcaneal Partial Excision   Date of Surgery:  Clearance 10/20/22                                 Surgeon:  Dr Hinton Rao  Surgeon's Group or Practice Name:  [facility]  Phone number:  [phone] Fax number:  [phone] & [phone]   Type of Clearance Requested:   - Medical    Type of Anesthesia:  General    Additional requests/questions:    Signed, April Henson   10/11/2022, 2:24 PM

## 2022-10-12 ENCOUNTER — Encounter (HOSPITAL_BASED_OUTPATIENT_CLINIC_OR_DEPARTMENT_OTHER): Payer: Self-pay | Admitting: Family

## 2022-10-12 ENCOUNTER — Ambulatory Visit (HOSPITAL_BASED_OUTPATIENT_CLINIC_OR_DEPARTMENT_OTHER): Payer: BC Managed Care – PPO | Admitting: Family

## 2022-10-12 VITALS — BP 130/68 | HR 51 | Ht 70.5 in | Wt 236.0 lb

## 2022-10-12 DIAGNOSIS — I10 Essential (primary) hypertension: Secondary | ICD-10-CM | POA: Diagnosis not present

## 2022-10-12 DIAGNOSIS — Z01818 Encounter for other preprocedural examination: Secondary | ICD-10-CM

## 2022-10-12 NOTE — Progress Notes (Signed)
Cardiology clearance note for 10-20-2022 surgery caillin walker np 10-12-2022 chart/epic.

## 2022-10-12 NOTE — Patient Instructions (Addendum)
Medication Instructions:  Continue your current medications.   *If you need a refill on your cardiac medications before your next appointment, please call your pharmacy*  Follow-Up: At [facility], you and your health needs are our priority.  As part of our continuing mission to provide you with exceptional heart care, we have created designated Provider Care Teams.  These Care Teams include your primary Cardiologist (physician) and Advanced Practice Providers (APPs -  Physician Assistants and Nurse Practitioners) who all work together to provide you with the care you need, when you need it.  We recommend signing up for the patient portal called "MyChart".  Sign up information is provided on this After Visit Summary.  MyChart is used to connect with patients for Virtual Visits (Telemedicine).  Patients are able to view lab/test results, encounter notes, upcoming appointments, etc.  Non-urgent messages can be sent to your provider as well.   To learn more about what you can do with MyChart, go to [email or web address].    Your next appointment:   1 year  Provider:   Bryan Lemma, MD     Other Instructions  We'll send a note to Dr. Theresia Bough that you are good to go for your surgery!  Heart Healthy Diet Recommendations: A low-salt diet is recommended. Meats should be grilled, baked, or boiled. Avoid fried foods. Focus on lean protein sources like fish or chicken with vegetables and fruits. The American Heart Association is a Chief Technology Officer!  American Heart Association Diet and Lifeystyle Recommendations   Exercise recommendations: The American Heart Association recommends 150 minutes of moderate intensity exercise weekly. Try 30 minutes of moderate intensity exercise 4-5 times per week. This could include walking, jogging, or swimming.

## 2022-10-12 NOTE — Progress Notes (Signed)
Cardiology Office Note:  .   Date:  10/12/2022  ID:  [name], DOB 1955-10-10, MRN [id number] PCP: [facility]  [facility] Providers Cardiologist:  Bryan Lemma, MD    History of Present Illness: .   Blake Solis is a 67 y.o. 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   Seen 11/2021 with ongoing bradycardia, fatigue, intermittent chest pain. Lexiscan Myoview 11/2021  low risk with normal LVEF, no ischemia nor infarction. Monitor 12/21/21 worn for 6 days with predominantly NSR with average HR 57 bpm. 7 atrial runs, one 5 beat run of VT. Triggered episodes with SR or PAT but no significant pause noted.   Presents today for follow-up and clearance for Achilles tendon repair and calcaneal partial excision. 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 He has been feeling well. BP at home routinely 130/80 or below. Reports no shortness of breath nor dyspnea on exertion. Reports no chest pain, pressure, or tightness. No edema, orthopnea, PND. Reports no palpitations.    ROS: Please see the history of present illness.    All other systems reviewed and are negative.   Studies Reviewed: Marland Kitchen   EKG Interpretation Date/Time:  Tuesday October 12 2022 11:15:30 EDT Ventricular Rate:  51 PR Interval:  162 QRS Duration:  84 QT Interval:  416 QTC Calculation: 383 R Axis:   32  Text Interpretation: Sinus bradycardia  No acute ST/T wave changes. Confirmed by Gillian Shields ([id number]) on 10/12/2022 11:35:22 AM    Cardiac Studies & Procedures   CARDIAC CATHETERIZATION  CARDIAC CATHETERIZATION 03/22/2002   STRESS TESTS  NM MYOCAR MULTI W/SPECT W 12/17/2021  Narrative   Exercised 1 min 46 seconds (3.3 METs), peak HR 85 bpm. Not able to complete exericse due to dizziness, unable to assess true functional capacity given early termination due to dizziness as opposed to cardiopulmonary limitations. Converted to  lexiscan   The study is normal. The study is low risk.   No ST deviation was noted.   LV perfusion is normal.   Left ventricular function is normal. End diastolic cavity size is normal.     MONITORS  LONG TERM MONITOR (3-14 DAYS) 12/21/2021  Narrative   Zio Patch Wear Time:  5 days and 20 hours (2023-09-19T12:56:50-0400 to 2023-09-25T09:48:02-399)   Predominant Underlying Rhythm: Sinus rhythm: HR range 43-112 bpm, Avg 57 bpm; +/- AV Block (w/ or w/o 1  AVB)   Rare isolated premature atrial contractions PACs (<1 %) noted, with rare couplets and triplets   Rare isolated premature ventricular contractions (PVCs) (<1 %) noted, with no couplets, triplets, bigeminy, or trigeminy   7 atrial Runs: Fastest 9 beats w/ HR Range 154-169 bpm, Avg 164 bpm, 3.5 sec; Longest 18 beats w/ HR Range 54-116 bpm, Avg 94 bpm, 12 sec   1 Runs of Non-Sustained Ventricular Tachycardia (>4 PVCs): 5 Beats w/ HR Range 97-138 bpm, Avg 122 bpm, 3.2 sec;   Patient Triggers: Sinus rhythm with rate ranges from 51 to 97 bpm.  No clear association with either bradycardia or PACs, PVCs or PAT runs.   No Sustained Arrhythmias: Atrial Tachycardia (AT), Supraventricular Tachycardia (SVT), Atrial Fibrillation (A-Fib), Atrial Flutter (A-Flutter), Sustained Ventricular Tachycardia (VT)  Overall, not a very helpful study.  Symptoms noted with sinus rhythm, not necessarily significant bradycardia.  No sustained arrhythmias.  Rare PACs and PVCs.  Difficult to determine cause of symptoms with them occurring with  heart rates ranging from 51 to 97 bpm.  The ability to achieve sinus heart rate up to 112 bpm would argue against chronotropic incompetence.  Bryan Lemma, MD           Risk Assessment/Calculations:             Physical Exam:   VS:  BP 130/68 (BP Location: Left Arm, Patient Position: Sitting, Cuff Size: Large)   Pulse (!) 51   Ht 5' 10.5" (1.791 m)   Wt 236 lb (107 kg)   BMI 33.38 kg/m    Wt Readings from Last 3  Encounters:  10/12/22 236 lb (107 kg)  04/29/22 234 lb (106.1 kg)  03/25/22 234 lb (106.1 kg)    GEN: Well nourished, well developed in no acute distress NECK: No JVD; No carotid bruits CARDIAC: RRR, no murmurs, rubs, gallops RESPIRATORY:  Clear to auscultation without rales, wheezing or rhonchi  ABDOMEN: Soft, non-tender, non-distended EXTREMITIES:  No edema; No deformity   ASSESSMENT AND PLAN: .    Preop - According to the Revised Cardiac Risk Index (RCRI), his Perioperative Risk of Major Cardiac Event is (%): 0.4. His Functional Capacity in METs is: 7.34 according to the Duke Activity Status Index (DASI). Per AHA/ACC guidelines, he is deemed acceptable risk for the planned procedure without additional cardiovascular testing. Will route to surgical team so they are aware.    HTN - BP well controlled. Continue current antihypertensive regimen.    Bradycardia - Not on AV nodal blocking agents.  Reports no lightness, dizziness.  Prior monitor with brief episodes of PAT and no significant pauses.  He reports no palpitations.  No indication for further intervention at this time.       Dispo: follow up in 1 year  Signed, Alver Sorrow, NP

## 2022-10-13 ENCOUNTER — Ambulatory Visit: Payer: BC Managed Care – PPO | Admitting: Orthopaedic Surgery

## 2022-10-15 NOTE — Progress Notes (Addendum)
Spoke with linda wife, made aware surgery time change, arrive 630 am 10-20-2022, clear liquids from midnight until 530 am then npo  H & P/medical clearance dr Mitzi Hansen on chart for 10-20-2022 surgery

## 2022-10-19 NOTE — Anesthesia Preprocedure Evaluation (Addendum)
Anesthesia Evaluation  Patient identified by MRN, date of birth, ID band Patient awake    Reviewed: Allergy & Precautions, NPO status , Patient's Chart, lab work & pertinent test results  History of Anesthesia Complications Negative for: history of anesthetic complications  Airway Mallampati: III  TM Distance: >3 FB Neck ROM: Full   Comment: Previous grade III view with Glidescope 4, easy mask with OPA Dental  (+) Dental Advisory Given   Pulmonary neg shortness of breath, neg sleep apnea, neg COPD, neg recent URI, former smoker   breath sounds clear to auscultation       Cardiovascular hypertension (amlodipine, losartan), Pt. on medications (-) angina (-) Past MI, (-) Cardiac Stents and (-) CABG (-) dysrhythmias  Rhythm:Regular Rate:Normal  HLD  Normal stress test 12/17/2021   Neuro/Psych negative neurological ROS     GI/Hepatic Neg liver ROS,GERD  ,,  Endo/Other  negative endocrine ROS    Renal/GU Renal disease (kidney donor)     Musculoskeletal  (+) Arthritis ,    Abdominal  (+) + obese  Peds  Hematology negative hematology ROS (+)   Anesthesia Other Findings   Reproductive/Obstetrics                             Anesthesia Physical Anesthesia Plan  ASA: 2  Anesthesia Plan: General   Post-op Pain Management: Regional block* and Tylenol PO (pre-op)*   Induction: Intravenous  PONV Risk Score and Plan: 2 and Ondansetron, Dexamethasone and Treatment may vary due to age or medical condition  Airway Management Planned: Oral ETT and Video Laryngoscope Planned  Additional Equipment:   Intra-op Plan:   Post-operative Plan: Extubation in OR  Informed Consent: I have reviewed the patients History and Physical, chart, labs and discussed the procedure including the risks, benefits and alternatives for the proposed anesthesia with the patient or authorized representative who has indicated  his/her understanding and acceptance.     Dental advisory given  Plan Discussed with: CRNA and Anesthesiologist  Anesthesia Plan Comments: (Discussed potential risks of nerve blocks including, but not limited to, infection, bleeding, nerve damage, seizures, pneumothorax, respiratory depression, and potential failure of the block. Alternatives to nerve blocks discussed. All questions answered.  Risks of general anesthesia discussed including, but not limited to, sore throat, hoarse voice, chipped/damaged teeth, injury to vocal cords, nausea and vomiting, allergic reactions, lung infection, heart attack, stroke, and death. All questions answered. )       Anesthesia Quick Evaluation

## 2022-10-20 ENCOUNTER — Ambulatory Visit (HOSPITAL_BASED_OUTPATIENT_CLINIC_OR_DEPARTMENT_OTHER): Payer: BC Managed Care – PPO

## 2022-10-20 ENCOUNTER — Encounter (HOSPITAL_BASED_OUTPATIENT_CLINIC_OR_DEPARTMENT_OTHER)
Admission: RE | Disposition: A | Discharge status: Home / Self Care | Payer: Self-pay | Source: Home / Self Care | Attending: Podiatry

## 2022-10-20 ENCOUNTER — Ambulatory Visit (HOSPITAL_BASED_OUTPATIENT_CLINIC_OR_DEPARTMENT_OTHER): Payer: Self-pay | Admitting: Anesthesiology

## 2022-10-20 ENCOUNTER — Other Ambulatory Visit: Payer: Self-pay

## 2022-10-20 ENCOUNTER — Encounter (HOSPITAL_BASED_OUTPATIENT_CLINIC_OR_DEPARTMENT_OTHER): Payer: Self-pay | Admitting: Podiatry

## 2022-10-20 ENCOUNTER — Ambulatory Visit (HOSPITAL_BASED_OUTPATIENT_CLINIC_OR_DEPARTMENT_OTHER)
Admission: RE | Admit: 2022-10-20 | Discharge: 2022-10-20 | Disposition: A | Discharge status: Home / Self Care | Payer: BC Managed Care – PPO | Attending: Podiatry | Admitting: Podiatry

## 2022-10-20 ENCOUNTER — Ambulatory Visit (HOSPITAL_BASED_OUTPATIENT_CLINIC_OR_DEPARTMENT_OTHER): Payer: BC Managed Care – PPO | Admitting: Anesthesiology

## 2022-10-20 DIAGNOSIS — M7751 Other enthesopathy of right foot: Secondary | ICD-10-CM | POA: Diagnosis not present

## 2022-10-20 DIAGNOSIS — M7731 Calcaneal spur, right foot: Secondary | ICD-10-CM | POA: Diagnosis not present

## 2022-10-20 DIAGNOSIS — M7661 Achilles tendinitis, right leg: Secondary | ICD-10-CM | POA: Diagnosis present

## 2022-10-20 DIAGNOSIS — M792 Neuralgia and neuritis, unspecified: Secondary | ICD-10-CM | POA: Insufficient documentation

## 2022-10-20 DIAGNOSIS — M62461 Contracture of muscle, right lower leg: Secondary | ICD-10-CM | POA: Insufficient documentation

## 2022-10-20 DIAGNOSIS — M728 Other fibroblastic disorders: Secondary | ICD-10-CM | POA: Diagnosis not present

## 2022-10-20 DIAGNOSIS — Z01818 Encounter for other preprocedural examination: Secondary | ICD-10-CM

## 2022-10-20 DIAGNOSIS — M899 Disorder of bone, unspecified: Secondary | ICD-10-CM | POA: Diagnosis not present

## 2022-10-20 HISTORY — PX: ACHILLES TENDON SURGERY: SHX542

## 2022-10-20 HISTORY — PX: OSTECTOMY: SHX6439

## 2022-10-20 HISTORY — DX: Presence of spectacles and contact lenses: Z97.3

## 2022-10-20 LAB — POCT I-STAT, CHEM 8
BUN: 26 mg/dL — ABNORMAL HIGH (ref 8–23)
Calcium, Ion: 1.24 mmol/L (ref 1.15–1.40)
Chloride: 104 mmol/L (ref 98–111)
Creatinine, Ser: 1.2 mg/dL (ref 0.61–1.24)
Glucose, Bld: 97 mg/dL (ref 70–99)
HCT: 43 % (ref 39.0–52.0)
Hemoglobin: 14.6 g/dL (ref 13.0–17.0)
Potassium: 4.5 mmol/L (ref 3.5–5.1)
Sodium: 139 mmol/L (ref 135–145)
TCO2: 24 mmol/L (ref 22–32)

## 2022-10-20 SURGERY — REPAIR, TENDON, ACHILLES
Anesthesia: General | Site: Ankle | Laterality: Right

## 2022-10-20 MED ORDER — DEXAMETHASONE SODIUM PHOSPHATE 10 MG/ML IJ SOLN
INTRAMUSCULAR | Status: DC | PRN
  Administered 2022-10-20: 5 mg via INTRAVENOUS

## 2022-10-20 MED ORDER — LIDOCAINE 2% (20 MG/ML) 5 ML SYRINGE
INTRAMUSCULAR | Status: DC | PRN
  Administered 2022-10-20: 100 mg via INTRAVENOUS

## 2022-10-20 MED ORDER — FENTANYL CITRATE (PF) 100 MCG/2ML IJ SOLN
INTRAMUSCULAR | Status: AC
  Filled 2022-10-20: qty 2

## 2022-10-20 MED ORDER — AMISULPRIDE (ANTIEMETIC) 5 MG/2ML IV SOLN: 10.0000 mg | Freq: Once | INTRAVENOUS | Status: DC | PRN

## 2022-10-20 MED ORDER — PROPOFOL 10 MG/ML IV BOLUS
INTRAVENOUS | Status: DC | PRN
  Administered 2022-10-20: 200 mg via INTRAVENOUS

## 2022-10-20 MED ORDER — PROPOFOL 10 MG/ML IV BOLUS
INTRAVENOUS | Status: AC
  Filled 2022-10-20: qty 20

## 2022-10-20 MED ORDER — ONDANSETRON HCL 4 MG/2ML IJ SOLN
INTRAMUSCULAR | Status: DC | PRN
  Administered 2022-10-20: 4 mg via INTRAVENOUS

## 2022-10-20 MED ORDER — CHLORHEXIDINE GLUCONATE CLOTH 2 % EX PADS: 6.0000 | MEDICATED_PAD | Freq: Once | CUTANEOUS | Status: DC

## 2022-10-20 MED ORDER — DEXAMETHASONE SODIUM PHOSPHATE 10 MG/ML IJ SOLN
INTRAMUSCULAR | Status: AC
  Filled 2022-10-20: qty 1

## 2022-10-20 MED ORDER — FENTANYL CITRATE (PF) 100 MCG/2ML IJ SOLN: 25.0000 ug | INTRAMUSCULAR | Status: DC | PRN

## 2022-10-20 MED ORDER — SUGAMMADEX SODIUM 200 MG/2ML IV SOLN
INTRAVENOUS | Status: DC | PRN
  Administered 2022-10-20: 200 mg via INTRAVENOUS

## 2022-10-20 MED ORDER — MIDAZOLAM HCL 2 MG/2ML IJ SOLN
INTRAMUSCULAR | Status: AC
  Filled 2022-10-20: qty 2

## 2022-10-20 MED ORDER — GLYCOPYRROLATE PF 0.2 MG/ML IJ SOSY
PREFILLED_SYRINGE | INTRAMUSCULAR | Status: AC
  Filled 2022-10-20: qty 1

## 2022-10-20 MED ORDER — CEFAZOLIN SODIUM-DEXTROSE 2-4 GM/100ML-% IV SOLN
INTRAVENOUS | Status: AC
  Filled 2022-10-20: qty 100

## 2022-10-20 MED ORDER — MIDAZOLAM HCL 2 MG/2ML IJ SOLN
2.0000 mg | Freq: Once | INTRAMUSCULAR | Status: AC
  Administered 2022-10-20: 2 mg via INTRAVENOUS

## 2022-10-20 MED ORDER — CEFAZOLIN SODIUM-DEXTROSE 2-4 GM/100ML-% IV SOLN
2.0000 g | INTRAVENOUS | Status: AC
  Administered 2022-10-20: 2 g via INTRAVENOUS

## 2022-10-20 MED ORDER — ROPIVACAINE HCL 5 MG/ML IJ SOLN
INTRAMUSCULAR | Status: DC | PRN
  Administered 2022-10-20: 25 mL via PERINEURAL
  Administered 2022-10-20: 15 mL via PERINEURAL

## 2022-10-20 MED ORDER — OXYCODONE HCL 5 MG/5ML PO SOLN: 5.0000 mg | Freq: Once | ORAL | Status: DC | PRN

## 2022-10-20 MED ORDER — ACETAMINOPHEN 500 MG PO TABS
1000.0000 mg | ORAL_TABLET | Freq: Once | ORAL | Status: AC
  Administered 2022-10-20: 1000 mg via ORAL

## 2022-10-20 MED ORDER — SODIUM CHLORIDE 0.9 % IV SOLN: INTRAVENOUS | Status: DC

## 2022-10-20 MED ORDER — ONDANSETRON HCL 4 MG/2ML IJ SOLN
INTRAMUSCULAR | Status: AC
  Filled 2022-10-20: qty 2

## 2022-10-20 MED ORDER — EPHEDRINE 5 MG/ML INJ
INTRAVENOUS | Status: AC
  Filled 2022-10-20: qty 5

## 2022-10-20 MED ORDER — ROCURONIUM BROMIDE 10 MG/ML (PF) SYRINGE
PREFILLED_SYRINGE | INTRAVENOUS | Status: DC | PRN
  Administered 2022-10-20: 10 mg via INTRAVENOUS
  Administered 2022-10-20: 50 mg via INTRAVENOUS

## 2022-10-20 MED ORDER — OXYCODONE HCL 5 MG PO TABS: 5.0000 mg | ORAL_TABLET | Freq: Once | ORAL | Status: DC | PRN

## 2022-10-20 MED ORDER — FENTANYL CITRATE (PF) 100 MCG/2ML IJ SOLN
INTRAMUSCULAR | Status: DC | PRN
  Administered 2022-10-20: 100 ug via INTRAVENOUS

## 2022-10-20 MED ORDER — EPHEDRINE SULFATE (PRESSORS) 50 MG/ML IJ SOLN
INTRAMUSCULAR | Status: DC | PRN
  Administered 2022-10-20 (×2): 10 mg via INTRAVENOUS

## 2022-10-20 MED ORDER — ACETAMINOPHEN 500 MG PO TABS
ORAL_TABLET | ORAL | Status: AC
  Filled 2022-10-20: qty 2

## 2022-10-20 MED ORDER — 0.9 % SODIUM CHLORIDE (POUR BTL) OPTIME
TOPICAL | Status: DC | PRN
  Administered 2022-10-20: 500 mL

## 2022-10-20 MED ORDER — GLYCOPYRROLATE PF 0.2 MG/ML IJ SOSY
PREFILLED_SYRINGE | INTRAMUSCULAR | Status: DC | PRN
  Administered 2022-10-20: .2 mg via INTRAVENOUS

## 2022-10-20 SURGICAL SUPPLY — 69 items
ADH SKN CLS APL DERMABOND .7 (GAUZE/BANDAGES/DRESSINGS) ×1
ANCHOR SPDBRG KL ACHILLES 3.9 (Anchor) IMPLANT
APL PRP STRL LF DISP 70% ISPRP (MISCELLANEOUS) ×1
BLADE MICRO SAGITTAL (BLADE) ×1 IMPLANT
BLADE SURG 15 STRL LF DISP TIS (BLADE) ×3 IMPLANT
BLADE SURG 15 STRL SS (BLADE) ×5
BNDG CMPR 6 X 5 YARDS HK CLSR (GAUZE/BANDAGES/DRESSINGS) ×2
BNDG CMPR 9X4 STRL LF SNTH (GAUZE/BANDAGES/DRESSINGS) ×1
BNDG ELASTIC 6INX 5YD STR LF (GAUZE/BANDAGES/DRESSINGS) ×1 IMPLANT
BNDG ESMARK 4X9 LF (GAUZE/BANDAGES/DRESSINGS) ×1 IMPLANT
BNDG GAUZE DERMACEA FLUFF 4 (GAUZE/BANDAGES/DRESSINGS) ×1 IMPLANT
BNDG GZE DERMACEA 4 6PLY (GAUZE/BANDAGES/DRESSINGS) ×1
CHLORAPREP W/TINT 26 (MISCELLANEOUS) ×1 IMPLANT
COVER BACK TABLE 60X90IN (DRAPES) ×1 IMPLANT
CUFF TOURN SGL QUICK 34 (TOURNIQUET CUFF) ×1
CUFF TRNQT CYL 34X4.125X (TOURNIQUET CUFF) IMPLANT
DERMABOND ADVANCED .7 DNX12 (GAUZE/BANDAGES/DRESSINGS) ×1 IMPLANT
DRAPE 3/4 80X56 (DRAPES) ×1 IMPLANT
DRAPE EXTREMITY T 121X128X90 (DISPOSABLE) ×1 IMPLANT
DRAPE OEC MINIVIEW 54X84 (DRAPES) ×1 IMPLANT
DRAPE SHEET LG 3/4 BI-LAMINATE (DRAPES) ×1 IMPLANT
DRAPE U-SHAPE 47X51 STRL (DRAPES) ×1 IMPLANT
ELECT REM PT RETURN 9FT ADLT (ELECTROSURGICAL) ×1
ELECTRODE REM PT RTRN 9FT ADLT (ELECTROSURGICAL) ×1 IMPLANT
FHL IMPLANT SYSTEM 7 (Anchor) ×1 IMPLANT
GAUZE 4X4 16PLY ~~LOC~~+RFID DBL (SPONGE) ×1 IMPLANT
GAUZE PAD ABD 8X10 STRL (GAUZE/BANDAGES/DRESSINGS) IMPLANT
GAUZE SPONGE 4X4 12PLY STRL (GAUZE/BANDAGES/DRESSINGS) ×2 IMPLANT
GAUZE XEROFORM 1X8 LF (GAUZE/BANDAGES/DRESSINGS) ×1 IMPLANT
GLOVE BIO SURGEON STRL SZ 6 (GLOVE) IMPLANT
GLOVE BIO SURGEON STRL SZ7.5 (GLOVE) ×1 IMPLANT
GLOVE BIO SURGEON STRL SZ8 (GLOVE) ×1 IMPLANT
GLOVE BIOGEL PI IND STRL 6 (GLOVE) IMPLANT
GLOVE BIOGEL PI IND STRL 6.5 (GLOVE) IMPLANT
GLOVE BIOGEL PI IND STRL 7.0 (GLOVE) IMPLANT
GLOVE BIOGEL PI IND STRL 8 (GLOVE) ×1 IMPLANT
GLOVE SURG SS PI 6.5 STRL IVOR (GLOVE) IMPLANT
GOWN STRL REUS W/ TWL LRG LVL3 (GOWN DISPOSABLE) IMPLANT
GOWN STRL REUS W/TWL LRG LVL3 (GOWN DISPOSABLE) ×1 IMPLANT
KIT SIZER GRAFT TENODESIS SUTR (KITS) IMPLANT
KIT TURNOVER CYSTO (KITS) ×1 IMPLANT
NDL HYPO 25X1 1.5 SAFETY (NEEDLE) ×1 IMPLANT
NEEDLE HYPO 25X1 1.5 SAFETY (NEEDLE) ×1
NS IRRIG 500ML POUR BTL (IV SOLUTION) IMPLANT
PACK BASIN DAY SURGERY FS (CUSTOM PROCEDURE TRAY) ×1 IMPLANT
PAD CAST 4YDX4 CTTN HI CHSV (CAST SUPPLIES) ×1 IMPLANT
PADDING CAST ABS COTTON 4X4 ST (CAST SUPPLIES) ×1 IMPLANT
PADDING CAST COTTON 4X4 STRL (CAST SUPPLIES) ×4
PENCIL SMOKE EVACUATOR (MISCELLANEOUS) ×1 IMPLANT
RASP SM TEAR CROSS CUT (RASP) ×1 IMPLANT
SLEEVE SCD COMPRESS KNEE MED (STOCKING) ×2 IMPLANT
SPLINT FIBERGLASS 4X30 (CAST SUPPLIES) ×1 IMPLANT
SPONGE T-LAP 4X18 ~~LOC~~+RFID (SPONGE) ×1 IMPLANT
STAPLER VISISTAT 35W (STAPLE) IMPLANT
STOCKINETTE 4 (MISCELLANEOUS) ×1 IMPLANT
STOCKINETTE 6 STRL (DRAPES) ×1 IMPLANT
SUCTION TUBE FRAZIER 10FR DISP (SUCTIONS) ×1 IMPLANT
SUT ETHILON 3 0 PS 1 (SUTURE) ×1 IMPLANT
SUT SILK 3 0 SH 30 (SUTURE) ×1 IMPLANT
SUT VIC AB 2-0 SH 27 (SUTURE) ×1
SUT VIC AB 2-0 SH 27XBRD (SUTURE) ×1 IMPLANT
SUT VIC AB 3-0 FS2 27 (SUTURE) ×1 IMPLANT
SYR BULB EAR ULCER 3OZ GRN STR (SYRINGE) ×1 IMPLANT
SYR CONTROL 10ML LL (SYRINGE) ×1 IMPLANT
SYSTEM IMPLANT FHL 7 (Anchor) IMPLANT
TOWEL OR 17X24 6PK STRL BLUE (TOWEL DISPOSABLE) ×1 IMPLANT
TUBE CONNECTING 12X1/4 (SUCTIONS) ×1 IMPLANT
UNDERPAD 30X36 HEAVY ABSORB (UNDERPADS AND DIAPERS) ×1 IMPLANT
YANKAUER SUCT BULB TIP NO VENT (SUCTIONS) ×1 IMPLANT

## 2022-10-20 NOTE — Anesthesia Procedure Notes (Signed)
Anesthesia Regional Block: Popliteal block   Pre-Anesthetic Checklist: , timeout performed,  Correct Patient, Correct Site, Correct Laterality,  Correct Procedure, Correct Position, site marked,  Risks and benefits discussed,  Surgical consent,  Pre-op evaluation,  At surgeon's request and post-op pain management  Laterality: Right  Prep: chloraprep       Needles:  Injection technique: Single-shot  Needle Type: Echogenic Stimulator Needle     Needle Length: 9cm  Needle Gauge: 21     Additional Needles:   Procedures:,,,, ultrasound used (permanent image in chart),,    Narrative:  Start time: 10/20/2022 8:21 AM End time: 10/20/2022 8:24 AM Injection made incrementally with aspirations every 5 mL.  Performed by: Personally  Anesthesiologist: Linton Rump, MD  Additional Notes: Discussed risks and benefits of nerve block including, but not limited to, prolonged and/or permanent nerve injury involving sensory and/or motor function. Monitors were applied and a time-out was performed. The nerve and associated structures were visualized under ultrasound guidance. After negative aspiration, local anesthetic was slowly injected around the nerve. There was no evidence of high pressure during the procedure. There were no paresthesias. VSS remained stable and the patient tolerated the procedure well.

## 2022-10-20 NOTE — Anesthesia Procedure Notes (Signed)
Anesthesia Regional Block: Adductor canal block   Pre-Anesthetic Checklist: , timeout performed,  Correct Patient, Correct Site, Correct Laterality,  Correct Procedure, Correct Position, site marked,  Risks and benefits discussed,  Surgical consent,  Pre-op evaluation,  At surgeon's request and post-op pain management  Laterality: Right  Prep: chloraprep       Needles:  Injection technique: Single-shot  Needle Type: Echogenic Stimulator Needle     Needle Length: 9cm  Needle Gauge: 21     Additional Needles:   Procedures:,,,, ultrasound used (permanent image in chart),,    Narrative:  Start time: 10/20/2022 8:25 AM End time: 10/20/2022 8:27 AM Injection made incrementally with aspirations every 5 mL.  Performed by: Personally  Anesthesiologist: Linton Rump, MD  Additional Notes: Discussed risks and benefits of nerve block including, but not limited to, prolonged and/or permanent nerve injury involving sensory and/or motor function. Monitors were applied and a time-out was performed. The nerve and associated structures were visualized under ultrasound guidance. After negative aspiration, local anesthetic was slowly injected around the nerve. There was no evidence of high pressure during the procedure. There were no paresthesias. VSS remained stable and the patient tolerated the procedure well.

## 2022-10-20 NOTE — Discharge Instructions (Addendum)
Post-Operative Discharge Instructions  Patient name: Blake Solis MRN: [id number] DOB: Jan 27, 1956  Date of Surgery: 10/20/2022            Location: [facility]  Follow up appointment: Next week  On the Day of Surgery: Please pick up prescription(s) prior to the morning of surgery.  Please go directly to the couch or bed and keep your feet elevated by putting two pillows under your feet and one pillow under your knees. Keep your feet out from under the blanket.   Discomfort and Swelling: Your foot may be numb for the remainder of the day. Swelling is expected. In some cases, the skin of the foot or the leg may take on a bruised, black and blue appearance.   Temperature: Take your temperature on the 2nd, 3rd, and 4th day after your surgery at 5:00pm. If your temperature is above 101 degrees, please call the physician's office.   Bleeding: A slight amount of drainage on the dressing is normal. Resting your foot in an elevated position will limit bleeding. If bleeding continues, wrap a towel around your foot and apply an ice pack.   Dressing: Keep the bandage absolutely dry and DO NOT remove the dressing.  If the dressing becomes wet or soiled, call your physician's office immediately.   Stitches: The stitches will remain in place for about two weeks, depending on the nature of your operation. Slight pulling sensation may be felt due to the stitches. This is a normal occurrence.   Ice: Apply a well-sealed ice pack to your foot or behind your knee for 20 minutes out of every hour for the first 24 hours after your surgery. DO NOT leave the ice pack in place at bedtime or during long naps. Do not place an ice pack directly on skin, place a washcloth between the ice pack and the dressing. This will help avoid getting the dressing wet, and help avoid any ice burn.  Weight Bearing Status:  Non-weight bearing    Shoes: Wear your post-operative shoe or boot anytime you put weight on  your feet, unless noted above. DO NOT DRIVE WITH BOOT ON RIGHT FOOT.  Diet: Return to your regular diet slowly within 24 hours. If you received sedation, your first meal should be light. Do not eat greasy or spicy foods for the first meal. Drink large quantities of liquids, especially water, citrus and other fruit juices. Please call if you're unable to keep fluids down. Eat food with your medication.  General Activities: Recovery is a gradual process; however you should feel better with each passing day. The first day, leave the bed only to go to the bathroom. Gradually increase your activity after the first day. For the first week or two, resting each day is important. Strenuous work, heavy lifting and excessive social activities should be avoided. Rest. Ice. Elevate.  Postoperative Care: The post-operative care periods lasts for approximately 6-12 weeks. During this time, periodic visits to your physician's office will be required so your healing process can be monitored closely. It is essential for your recovery that you continue to be monitored by your physician until you are discharged and completely healed. Care during this time is the most important part of your recovery process.   Recovery from anesthesia: You may feel drowsy and reflexes may be slowed for 24 hours. Do not drive, use machinery, appliances, ride bicycles, or scooter. Do not make important decisions.   Complications: Please call your physician following surgery  if you have any of the following complications:          1.)  Severe pain unrelieved by medication   2.) Excessive heavy or prolonged bleeding   3.) Dizziness or fainting   4.) Soiled or wet dressings   5.) Temperature over 101.0 degrees   Medications: Oxycodone 5 mg every 6 hours as needed. Aspirin 81 mg morning and night            No acetaminophen/Tylenol until after 1:00 pm today if needed.   Post Anesthesia Home Care Instructions  Activity: Get  plenty of rest for the remainder of the day. A responsible individual must stay with you for 24 hours following the procedure.  For the next 24 hours, DO NOT: -Drive a car -Advertising copywriter -Drink alcoholic beverages -Take any medication unless instructed by your physician -Make any legal decisions or sign important papers.  Meals: Start with liquid foods such as gelatin or soup. Progress to regular foods as tolerated. Avoid greasy, spicy, heavy foods. If nausea and/or vomiting occur, drink only clear liquids until the nausea and/or vomiting subsides. Call your physician if vomiting continues.  Special Instructions/Symptoms: Your throat may feel dry or sore from the anesthesia or the breathing tube placed in your throat during surgery. If this causes discomfort, gargle with warm salt water. The discomfort should disappear within 24 hours.

## 2022-10-20 NOTE — Anesthesia Procedure Notes (Signed)
Procedure Name: Intubation Date/Time: 10/20/2022 9:04 AM  Performed by: Bishop Limbo, CRNAPre-anesthesia Checklist: Patient identified, Emergency Drugs available, Suction available and Patient being monitored Patient Re-evaluated:Patient Re-evaluated prior to induction Oxygen Delivery Method: Circle System Utilized Preoxygenation: Pre-oxygenation with 100% oxygen Induction Type: IV induction Ventilation: Mask ventilation without difficulty Laryngoscope Size: Glidescope and 3 Grade View: Grade I Tube type: Oral Tube size: 7.0 mm Number of attempts: 1 Airway Equipment and Method: Video-laryngoscopy and Rigid stylet Placement Confirmation: ETT inserted through vocal cords under direct vision, positive ETCO2 and breath sounds checked- equal and bilateral Secured at: 22 cm Tube secured with: Tape Dental Injury: Teeth and Oropharynx as per pre-operative assessment  Comments: Elective GlideScope used for h/o Gr 3 view with GlideScope for shoulder surgery. With pt supine on stretcher, Grade 1 view with GlideScope achieved today.

## 2022-10-20 NOTE — Op Note (Addendum)
OPERATIVE REPORT Patient name: Blake Solis MRN: [id number] DOB: Apr 03, 1955  DOS:  10/20/2022  Preop Dx: Pre-op testing - Plan: CBG per Guidelines for Diabetes Management for Patients Undergoing Surgery (MC, AP, and [facility] only), CBG per protocol, I-Stat, Chem 8 on day of surgery per protocol, CBG per Guidelines for Diabetes Management for Patients Undergoing Surgery ([facility], AP, and [facility] only), CBG per protocol, I-Stat, Chem 8 on day of surgery per protocol Postop Dx: same  Procedure:  1. Achilles tendon secondary repair with Arthrex suture bridge. 2. Flexor hallucis longus tendon transfer. 3. Exostectomy calcaneus  Surgeon: Hinton Rao, DPM  Anesthesia: General and POP block  Hemostasis: Thigh tourniquet inflated to a pressure of after esmarch exsanguination   EBL: 20 mL Materials: Arthrex 7.5 mm bioswivelock screw, arthrex achilles speedbridge system. Injectables: none Pathology: Tendon right foot  Condition: The patient tolerated the procedure and anesthesia well. No complications noted or reported   Justification for procedure: The patient is a 67 y.o. male who presents today for surgical correction of Achilles tendonitis, calcaneal exostosis. All conservative modalities of been unsuccessful in providing any sort of satisfactory alleviation of symptoms with the patient. The patient was identified preoperatively and the correct operative extremity was marked by myself. Patient was educated pre-operatively on risks, complications, and alternatives. The patient consented for surgical correction. The patient consent form was reviewed. All patient questions were answered. No guarantees were expressed or implied. The patient and the surgeon both signed the patient consent form with the witness present and placed in the patient's chart.   Procedure in Detail: The patient was brought to the operating room, placed on the operating table in a prone position and anesthesia was induced. The  right lower extremity was prepped and draped in a standard sterile fashion and a timeout was performed to verify the correct operative site. Attention was then directed to the surgical area where procedure number one commenced.  Procedure #1: Calcaneal ostectomy  CPT 28119:   A longitudinal posterior midline incision was created overlying the distal Achilles tendon and the posterior tuberosity of the calcaneus.  This incision was carried full-thickness through the skin, subcutaneous tissue, paratenon, and the Achilles tendon. The achilles tendon was noted to have a yellowed appearance and a portion of the tendon was taken and sent for pathologic diagnosis. The incision was carried straight down to bone distally.  I then proceeded to carefully elevate the Achilles tendon off of the insertion site beginning from the central incision and progressing medially and laterally, taking great care to protect the most medial and lateral insertional fibers.  The small amounts of palpable calcified tendon tissue were excised with a #15 scalpel blade.  I then proceeded to resect the posterior calcaneal enthesophytes using a hand osteotome.  An oscillating saw was then used to obliquely resect the Haglund's deformity and this portion of bone was removed in its entirety.  The resection site was then smoothed with the oscillating saw and a small rongeur.  I then thoroughly irrigated the wound.   Procedure #2: Flexor Hallucis Longus Tendon Transfer CPT [id number]:  I then proceeded to retract the Achilles tendon and divided the deep fascia overlying the flexor hallucis longus muscle belly and tendon.  The tendon was identified and carefully dissected as far distal as possible along the posterior-medial aspect of the hindfoot.  I then fully plantarflexed the hallux and transected the tendon as far distal as possible.  We attached a FiberLoop suture to the end  of the tendon.  A guidepin for the Arthrex Bio-Tenodesis screws was  driven into the posterior tuberosity of the calcaneus, anterior to the proposed reinsertion site.  It was driven through the plantar aspect of the heel and skin distal to the plantar fat pad and lateral to the midline.  This pin was then overreamed with a 7.5 mm reamer in a bicortical fashion.  We then proceeded to pass the flexor hallucis longus tendon from dorsal to plantar through the calcaneus.  It was placed on maximum tension and then slightly released.  While maintaining this tension, we then inserted a 7.5 mm x 23 mm Bio-Tenodesis screw to secure the transferred tendon.  Procedure #3:Secondary Achilles reconstruction: CPT [id number] (speedbridge repair)   I then utilized the #15 scalpel blade to resect all of the degenerative tendon tissue from the medial and lateral limbs of the Achilles.  All of the tissue that was disorganized and had the appearance of crabmeat was excised, thereby leaving [place] with residual well-organized collagen fibrils.  The retrocalcaneal bursa was also excised in its entirety.  I then drilled a set of 2 holes into the oblique Haglund's resection site and then drilled another set of 2 holes parallel and distal to the first pair within the posterior tuberosity of the calcaneus.  Each of the 4 holes was tapped and we then proceeded to insert 2 of the SwiveLock anchors into the proximal row.  The FiberTape suture emanating from each of the anchors was then utilized to grasp the medial and lateral limbs of the Achilles tendon in a horizontal mattress fashion.  The ankle was placed in slight plantarflexion and I cinched down the medial and lateral limbs.  1 strand of the FiberTape suture from each proximal row anchor was passed through the eyelet of 2 additional SwiveLock anchors and this allowed [place] to cinch down the free distal edge of each limb to the posterior tuberosity.  The excess suture material was excised and I felt that we had a good secure double row SpeedBridge repair of the  Achilles.  The longitudinal split within the Achilles tendon was repaired in a side-to-side fashion with 2-0 Vicryl and the paratenon was also repaired in a side-to-side fashion with 2-0 Vicryl.  The tourniquet was released and hemostasis was secured.  I then closed the skin with interrupted 3-0 nylon horizontal mattress sutures.  Dry sterile dressings were applied and the patient was placed in a well-padded short leg fiberglass cast in resting plantarflexion.  At the end of the case, all counts were correct, and the patient was returned to the postanesthesia care unit in stable condition.      Dry sterile compressive dressings were then applied to all previously mentioned incision sites about the patient's lower extremity. The tourniquet which was used for hemostasis was deflated. All normal neurovascular responses including pink color and warmth returned all the digits of patient's lower extremity.  The patient was then transferred from the operating room to the recovery room having tolerated the procedure and anesthesia well. All vital signs are stable. After a brief stay in the recovery room the patient was discharged with adequate prescriptions for analgesia. Verbal as well as written instructions were provided for the patient regarding wound care. The patient is to keep the dressings clean dry and intact until they are to follow surgeon Dr. Hinton Rao, DPM in the office upon discharge.   Hinton Rao DPM

## 2022-10-20 NOTE — Transfer of Care (Signed)
Immediate Anesthesia Transfer of Care Note  Patient: Blake Solis  Procedure(s) Performed: ACHILLES TENDON REPAIR (Right: Ankle) CALCANEAL PARTIAL EXCISION, Flexor digitorum longus tendon transfer, right (Right: Ankle)  Patient Location: PACU  Anesthesia Type:General and Regional  Level of Consciousness: awake, alert , oriented, and patient cooperative  Airway & Oxygen Therapy: Patient Spontanous Breathing and Patient connected to face mask oxygen  Post-op Assessment: Report given to RN and Post -op Vital signs reviewed and stable  Post vital signs: Reviewed and stable  Last Vitals:  Vitals Value Taken Time  BP 171/84 10/20/22 1107  Temp 36.7 C 10/20/22 1107  Pulse 57 10/20/22 1113  Resp 15 10/20/22 1113  SpO2 100 % 10/20/22 1113  Vitals shown include unfiled device data.  Last Pain:  Vitals:   10/20/22 0702  TempSrc: Oral  PainSc: 5       Patients Stated Pain Goal: 3 (10/20/22 1478)  Complications: No notable events documented.

## 2022-10-20 NOTE — Progress Notes (Signed)
Assisted Dr. Jennifer Allan with right, adductor canal, popliteal, ultrasound guided block. Side rails up, monitors on throughout procedure. See vital signs in flow sheet. Tolerated Procedure well. 

## 2022-10-20 NOTE — Anesthesia Postprocedure Evaluation (Signed)
Anesthesia Post Note  Patient: SHELIA OHLER  Procedure(s) Performed: ACHILLES TENDON REPAIR (Right: Ankle) CALCANEAL PARTIAL EXCISION, Flexor digitorum longus tendon transfer, right (Right: Ankle)     Patient location during evaluation: PACU Anesthesia Type: General Level of consciousness: awake Pain management: pain level controlled Vital Signs Assessment: post-procedure vital signs reviewed and stable Respiratory status: spontaneous breathing, nonlabored ventilation and respiratory function stable Cardiovascular status: blood pressure returned to baseline and stable Postop Assessment: no apparent nausea or vomiting Anesthetic complications: no   No notable events documented.  Last Vitals:  Vitals:   10/20/22 1133 10/20/22 1145  BP: (!) 146/68 (!) 152/77  Pulse: (!) 55 (!) 54  Resp: 18 18  Temp:    SpO2: 97% 94%    Last Pain:  Vitals:   10/20/22 1130  TempSrc:   PainSc: 0-No pain                 Linton Rump

## 2022-10-20 NOTE — H&P (Signed)
History and Physical Interval Note:  10/20/2022 8:19 AM  Blake Solis  has presented today for surgery, with the diagnosis of right achilles tendonitis and right haglunds deformity.  The various methods of treatment have been discussed with the patient and family. After consideration of risks, benefits and other options for treatment, the patient has consented to   Procedure(s): ACHILLES TENDON REPAIR (Right) CALCANEAL PARTIAL EXCISION (Right) as a surgical intervention.  The patient's history has been reviewed, patient examined, no change in status, stable for surgery.  I have reviewed the patient's chart and labs.  Questions were answered to the patient's satisfaction.     Yuri Flener Paulla Dolly

## 2022-10-21 ENCOUNTER — Encounter (HOSPITAL_BASED_OUTPATIENT_CLINIC_OR_DEPARTMENT_OTHER): Payer: Self-pay | Admitting: Podiatry

## 2022-11-16 ENCOUNTER — Encounter: Payer: Self-pay | Admitting: Internal Medicine

## 2022-12-10 NOTE — Therapy (Signed)
OUTPATIENT PHYSICAL THERAPY LOWER EXTREMITY EVALUATION   Patient Name: Blake Solis MRN: [id number] DOB:03-Apr-1955, 67 y.o., male Today's Date: 12/13/2022  END OF SESSION:  PT End of Session - 12/13/22 0844     Visit Number 1    Number of Visits 8    Date for PT Re-Evaluation 01/10/23    Authorization Type BCBS    Authorization Time Period 5000 ded; 2% coinsurance; needs auth    PT Start Time 0845    PT Stop Time 0925    PT Time Calculation (min) 40 min    Activity Tolerance Patient tolerated treatment well    Behavior During Therapy [facility] for tasks assessed/performed             Past Medical History:  Diagnosis Date   Arthritis    Bronchitis, allergic    Essential hypertension    On several medications.   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   OSTECTOMY Right 10/20/2022   Procedure: CALCANEAL PARTIAL EXCISION, Flexor digitorum longus tendon transfer, right;  Surgeon: Kieth Brightly, DPM;  Location: [facility];  Service: Podiatry;  Laterality: Right;   POLYPECTOMY  09/06/2019   Procedure: POLYPECTOMY;  Surgeon: Malissa Hippo, MD;  [facility]: [facility];  Service: Endoscopy;;   SCROTAL EXPLORATION N/A 01/01/2021   Procedure: SCROTUM EXPLORATION- excision of scrotal sebacous cyst;  Surgeon: Malen Gauze, MD;  Location: [facility];  Service: Urology;  Laterality: N/A;   SCROTAL EXPLORATION N/A 02/08/2022   Procedure: SCROTUM EXPLORATION- excision of cyst;  Surgeon: Malen Gauze, MD;  Location: [facility];  Service: Urology;  Laterality: N/A;   SHOULDER ARTHROSCOPY WITH ROTATOR CUFF REPAIR AND SUBACROMIAL DECOMPRESSION Right 10/14/2017   Procedure: RIGHT SHOULDER ARTHROSCOPY WITH EXTENSIVE DEBRIDEMENT, SUBACROMIAL DECOMPRESSION, DISTAL CLAVICLE EXCISION AND BICEPS TENODYSIS;  Surgeon: Tarry Kos, MD;  Location: [facility];  Service: Orthopedics;  Laterality: Right;   TOTAL HIP ARTHROPLASTY Right 06/07/2012   Procedure: TOTAL HIP ARTHROPLASTY ANTERIOR APPROACH;  Surgeon: Eldred Manges, MD;  Location: [facility];  Service: Orthopedics;  Laterality: Right;  Right Total Hip Arthroplasty-Anterior Approach  TRANSTHORACIC ECHOCARDIOGRAM  04/28/2020   [facility]) EF 65 to 70%.  No or WMA.  GR 1 DD.  Mildly thickened aortic valve.  Mild to moderately dilated left atrium.  Normal RV size and function.  Mild RA dilation.:   ULNAR NERVE TRANSPOSITION Left 11/13/2020   Procedure: left elbow ulnar nerve neurolysis, ganglion cyst removal;  Surgeon: Tarry Kos, MD;  Location: [facility];  Service: Orthopedics;  Laterality: Left;   Patient Active Problem List   Diagnosis Date Noted   Unilateral primary  osteoarthritis, right hip 03/25/2022   Symptomatic bradycardia 12/04/2021   Scrotal sebaceous cyst 12/24/2020   Ganglion of left elbow 10/28/2020   Chest pain with high risk for cardiac etiology 04/29/2020   Renal cyst 01/21/2020   Benign prostatic hyperplasia with urinary obstruction 01/21/2020   Nocturia 01/21/2020   S/P arthroscopy of right shoulder 11/01/2017   Superior glenoid labrum lesion of right shoulder    Arthrosis of right acromioclavicular joint    Tendinopathy of rotator cuff, right    Impingement syndrome of right shoulder    Nontraumatic tear of right supraspinatus tendon 09/29/2017   Cubital tunnel syndrome on left 05/13/2017   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) 06/07/2012    Class: Diagnosis of   Essential hypertension 07/08/2010   Hyperlipidemia due to dietary fat intake 07/08/2010   Prostatitis 07/08/2010   Degenerative disc disease 07/08/2010   Hydrocele of testis 07/08/2010   Obesity (BMI 35.0-39.9 without comorbidity) 09/08/2009    PCP: [facility]  REFERRING PROVIDER: Kieth Brightly, DPM  REFERRING DIAG: M76.60 (ICD-10-CM) - Achilles tendinitis  THERAPY DIAG:  Achilles tendinitis, unspecified laterality - Plan: PT plan of care cert/re-cert  Difficulty in walking, not elsewhere classified - Plan: PT plan of care cert/re-cert  Rationale for Evaluation and Treatment: Rehabilitation  ONSET DATE: 10/20/22  SUBJECTIVE:   SUBJECTIVE STATEMENT: S/p right Achilles tendon surgery at [facility] 10/20/22; reattached the tendon and shaved down bone spur; put in cast for 6 weeks NWBing; was using a knee scooter and now in CAM boot until 10/2.  Now having some trouble with left leg due to right leg being in walking boot.  Some difficulty with navigating steps  PERTINENT HISTORY: S/p right THA 2014; right shoulder surgery 2019 and left elbow twice PAIN:  Are you having pain? Yes: NPRS scale: 1/10 Pain location: right achilles Pain  description: burning Aggravating factors: unknown Relieving factors: put weight on it  PRECAUTIONS: Fall  WEIGHT BEARING RESTRICTIONS:  WBAT right foot  FALLS:  Has patient fallen in last 6 months? 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  PLOF: Independent  PATIENT GOALS: get back to work  NEXT MD VISIT: 12/22/22  OBJECTIVE:   DIAGNOSTIC FINDINGS:   PATIENT SURVEYS:  LEFS 37/80 46.3 %  COGNITION: Overall cognitive status: Within functional limits for tasks assessed     SENSATION: Mild numbness in right toes  EDEMA:  Normal for this time s/p   POSTURE: rounded shoulders, forward head, and flexed trunk   PALPATION: Tender at and around incision; mild redness noted around incision but no oozing or draining noted  LOWER EXTREMITY ROM:  Active ROM Right eval Left eval  Hip flexion    Hip extension    Hip abduction    Hip adduction    Hip internal rotation    Hip external rotation    Knee flexion  Knee extension    Ankle dorsiflexion 4 10  Ankle plantarflexion 26 46  Ankle inversion 24 26  Ankle eversion 10 14   (Blank rows = not tested)  LOWER EXTREMITY MMT:  MMT Right eval Left eval  Hip flexion    Hip extension    Hip abduction    Hip adduction    Hip internal rotation    Hip external rotation    Knee flexion    Knee extension    Ankle dorsiflexion 3   Ankle plantarflexion 3-   Ankle inversion 3   Ankle eversion 3    (Blank rows = not tested)   FUNCTIONAL TESTS:  5 times sit to stand: 13.52 no UE assist; shift to the left  GAIT: Distance walked: 50 ft in clinic Assistive device utilized: Single point cane, Walker - 2 wheeled, Shower bench, bed side commode, Ramped entry, and knee scooter Level of assistance: SBA Comments: CAM boot right foot   TODAY'S TREATMENT:                                                                                                                               DATE: 12/13/22 physical therapy evaluation and HEP instruction    PATIENT EDUCATION:  Education details: Patient educated on exam findings, POC, scope of PT, HEP, and what to expect next visit. Person educated: Patient Education method: Explanation, Demonstration, and Handouts Education comprehension: verbalized understanding, returned demonstration, verbal cues required, and tactile cues required   HOME EXERCISE PROGRAM: Access Code: [email or web address] URL: [email or web address] Date: 12/13/2022 Prepared by: [facility]  Exercises - Seated Heel Toe Raises  - 2 x daily - 7 x weekly - 1 sets - 10 reps - Seated Heel Slide  - 2 x daily - 7 x weekly - 1 sets - 10 reps - 3 sec hold - Long Sitting Calf Stretch with Strap  - 1 x daily - 7 x weekly - 1 sets - 5 reps - 20 sec hold  ASSESSMENT:  CLINICAL IMPRESSION: Patient is a 67 y.o. male who was seen today for physical therapy evaluation and treatment for M76.60 (ICD-10-CM) - Achilles tendinitis.Patient demonstrates muscle weakness, reduced ROM, and fascial restrictions which are likely contributing to symptoms of pain and are negatively impacting patient ability to perform ADLs and functional mobility tasks. Patient will benefit from skilled physical therapy services to address these deficits to reduce pain and improve level of function with ADLs and functional mobility tasks.   OBJECTIVE IMPAIRMENTS: Abnormal gait, decreased activity tolerance, decreased mobility, difficulty walking, decreased ROM, decreased strength, increased fascial restrictions, impaired perceived functional ability, and pain.   ACTIVITY LIMITATIONS: carrying, lifting, bending, standing, squatting, stairs, transfers, and locomotion level  PARTICIPATION LIMITATIONS: meal prep, cleaning, laundry, driving, shopping, occupation, and yard work  [facility] POTENTIAL: Good  CLINICAL DECISION MAKING: Stable/uncomplicated  EVALUATION COMPLEXITY: Low   GOALS: Goals  reviewed with patient? No  SHORT TERM GOALS: Target date:  12/27/2022 patient will be independent with initial HEP  Baseline: Goal status: INITIAL  2.  Patient will self report 30% improvement to improve tolerance for functional activity  Baseline:  Goal status: INITIAL   LONG TERM GOALS: Target date: 01/10/2023  Patient will be independent in self management strategies to improve quality of life and functional outcomes.  Baseline:  Goal status: INITIAL  2.  Patient will self report 50% improvement to improve tolerance for functional activity  Baseline:  Goal status: INITIAL  3.  Patient will increase right  leg MMTs to 4-5/5 without pain to promote return to ambulation community distances with minimal deviation.  Baseline: see above Goal status: INITIAL  4.  Patient with ambulate in regular shoes x 300 ft in 2 min or less with minimal gait deviation to demonstrate improved functional mobility and gait Baseline:  Goal status: INITIAL  5.  Patient will increase right ankle AROM by 20 degrees total to improve ability to navigate steps and uneven surfaces safely.  Baseline:  Goal status: INITIAL  6.  Patient will increased score on LEFS by 10 points  (47) to demonstrate improved perceived functional mobility.  Baseline: 37/80 Goal status: INITIAL   PLAN:  PT FREQUENCY: 2x/week  PT DURATION: 4 weeks  PLANNED INTERVENTIONS: Therapeutic exercises, Therapeutic activity, Neuromuscular re-education, Balance training, Gait training, Patient/Family education, Joint manipulation, Joint mobilization, Stair training, Orthotic/Fit training, DME instructions, Aquatic Therapy, Dry Needling, Electrical stimulation, Spinal manipulation, Spinal mobilization, Cryotherapy, Moist heat, Compression bandaging, scar mobilization, Splintting, Taping, Traction, Ultrasound, Ionotophoresis 4mg /ml Dexamethasone, and Manual therapy   PLAN FOR NEXT SESSION: Review HEP and goals; progress right  ankle mobility and strength as able; progress to shoe when MD ok; marble pickup, towel crunches; 4 way theraband  9:34 AM, 12/13/22 Kenlyn Lose Small Katlen Seyer MPT [facility] [place] [phone] Ph:[phone]

## 2022-12-13 ENCOUNTER — Ambulatory Visit (HOSPITAL_COMMUNITY): Payer: BC Managed Care – PPO | Attending: Podiatry

## 2022-12-13 ENCOUNTER — Other Ambulatory Visit: Payer: Self-pay

## 2022-12-13 ENCOUNTER — Telehealth: Payer: Self-pay | Admitting: *Deleted

## 2022-12-13 DIAGNOSIS — R262 Difficulty in walking, not elsewhere classified: Secondary | ICD-10-CM | POA: Diagnosis present

## 2022-12-13 DIAGNOSIS — M7660 Achilles tendinitis, unspecified leg: Secondary | ICD-10-CM | POA: Insufficient documentation

## 2022-12-13 DIAGNOSIS — K824 Cholesterolosis of gallbladder: Secondary | ICD-10-CM

## 2022-12-13 NOTE — Telephone Encounter (Signed)
Pt called in. Received recall letter to have US done. Do you want him to have RUQ or complete?

## 2022-12-14 NOTE — Addendum Note (Signed)
Addended by: Armstead Peaks on: 12/14/2022 07:25 AM   Modules accepted: Orders

## 2022-12-14 NOTE — Telephone Encounter (Signed)
Pt aware of [place] appt details.

## 2022-12-15 ENCOUNTER — Ambulatory Visit (HOSPITAL_COMMUNITY): Payer: BC Managed Care – PPO | Admitting: Physical Therapy

## 2022-12-15 DIAGNOSIS — M7660 Achilles tendinitis, unspecified leg: Secondary | ICD-10-CM | POA: Diagnosis not present

## 2022-12-15 DIAGNOSIS — R262 Difficulty in walking, not elsewhere classified: Secondary | ICD-10-CM

## 2022-12-15 NOTE — Therapy (Signed)
OUTPATIENT PHYSICAL THERAPY TREATMENT   Patient Name: Blake Solis MRN: [id number] DOB:1955/09/14, 67 y.o., male Today's Date: 12/15/2022  END OF SESSION:  PT End of Session - 12/15/22 1301     Visit Number 2    Number of Visits 8    Date for PT Re-Evaluation 01/10/23    Authorization Type BCBS    Authorization Time Period 5000 ded; 2% coinsurance; needs auth    PT Start Time 1302    PT Stop Time 1340    PT Time Calculation (min) 38 min    Activity Tolerance Patient tolerated treatment well    Behavior During Therapy WFL for tasks assessed/performed             Past Medical History:  Diagnosis Date   Arthritis    Bronchitis, allergic    Essential hypertension    On several medications.   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   Wears glasses    Past Surgical History:  Procedure Laterality Date   ACHILLES TENDON SURGERY Right 10/20/2022   Procedure: ACHILLES TENDON REPAIR;  Surgeon: Kieth Brightly, DPM;  Location: [facility];  Service: Podiatry;  Laterality: Right;   CARDIAC CATHETERIZATION  04/04/2002   Images reviewed: Normal coronaries   COLONOSCOPY N/A 09/06/2019   Procedure: COLONOSCOPY;  Surgeon: Malissa Hippo, MD;  [facility]: [facility];  Service: Endoscopy;  Laterality: N/A;  135   CYST EXCISION Right    cyst removed from arm   ELBOW SURGERY Left    HIP ARTHROPLASTY Right    KIDNEY DONATION Left    Was donor for his brother   KNEE ARTHROSCOPY Right 2013   lft ulnar nerve removed     decompression   MASS EXCISION Left 09/14/2021   Procedure: EXCISION SOFT TISSUE MASS LEFT FOOT;  Surgeon: Kieth Brightly, DPM;  Location: [facility];  Service: Podiatry;  Laterality: Left;  MAC WITH LOCAL    NEPHRECTOMY Left    left, donated to his brother, EF 55-60%...   OSTECTOMY Right 10/20/2022   Procedure: CALCANEAL PARTIAL EXCISION, Flexor digitorum longus tendon transfer, right;  Surgeon: Kieth Brightly, DPM;  Location: [facility];  Service: Podiatry;  Laterality: Right;   POLYPECTOMY  09/06/2019   Procedure: POLYPECTOMY;  Surgeon: Malissa Hippo, MD;  Location: [facility];  Service: Endoscopy;;   SCROTAL EXPLORATION N/A 01/01/2021   Procedure: SCROTUM EXPLORATION- excision of scrotal sebacous cyst;  Surgeon: Malen Gauze, MD;  Location: [facility];  Service: Urology;  Laterality: N/A;   SCROTAL EXPLORATION N/A 02/08/2022   Procedure: SCROTUM EXPLORATION- excision of cyst;  Surgeon: Malen Gauze, MD;  Location: [facility];  Service: Urology;  Laterality: N/A;   SHOULDER ARTHROSCOPY WITH ROTATOR CUFF REPAIR AND SUBACROMIAL DECOMPRESSION Right 10/14/2017   Procedure: RIGHT SHOULDER ARTHROSCOPY WITH EXTENSIVE DEBRIDEMENT, SUBACROMIAL DECOMPRESSION, DISTAL CLAVICLE EXCISION AND BICEPS TENODYSIS;  Surgeon: Tarry Kos, MD;  Location: [facility];  Service: Orthopedics;  Laterality: Right;   TOTAL HIP ARTHROPLASTY Right 06/07/2012   Procedure: TOTAL HIP ARTHROPLASTY ANTERIOR APPROACH;  Surgeon: Eldred Manges, MD;  Location: [facility];  Service: Orthopedics;  Laterality: Right;  Right Total Hip Arthroplasty-Anterior Approach   TRANSTHORACIC  ECHOCARDIOGRAM  04/28/2020   [facility]) EF 65 to 70%.  No or WMA.  GR 1 DD.  Mildly thickened aortic valve.  Mild to moderately dilated left atrium.  Normal RV size and function.  Mild RA dilation.:   ULNAR NERVE TRANSPOSITION Left 11/13/2020   Procedure: left elbow ulnar nerve neurolysis, ganglion cyst removal;  Surgeon: Tarry Kos, MD;  Location: [facility];  Service: Orthopedics;  Laterality: Left;   Patient Active Problem List   Diagnosis Date Noted   Unilateral primary osteoarthritis, right hip  03/25/2022   Symptomatic bradycardia 12/04/2021   Scrotal sebaceous cyst 12/24/2020   Ganglion of left elbow 10/28/2020   Chest pain with high risk for cardiac etiology 04/29/2020   Renal cyst 01/21/2020   Benign prostatic hyperplasia with urinary obstruction 01/21/2020   Nocturia 01/21/2020   S/P arthroscopy of right shoulder 11/01/2017   Superior glenoid labrum lesion of right shoulder    Arthrosis of right acromioclavicular joint    Tendinopathy of rotator cuff, right    Impingement syndrome of right shoulder    Nontraumatic tear of right supraspinatus tendon 09/29/2017   Cubital tunnel syndrome on left 05/13/2017   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) 06/07/2012    Class: Diagnosis of   Essential hypertension 07/08/2010   Hyperlipidemia due to dietary fat intake 07/08/2010   Prostatitis 07/08/2010   Degenerative disc disease 07/08/2010   Hydrocele of testis 07/08/2010   Obesity (BMI 35.0-39.9 without comorbidity) 09/08/2009    PCP: [facility]  REFERRING PROVIDER: Kieth Brightly, DPM  REFERRING DIAG: M76.60 (ICD-10-CM) - Achilles tendinitis  THERAPY DIAG:  Achilles tendinitis, unspecified laterality  Difficulty in walking, not elsewhere classified  Rationale for Evaluation and Treatment: Rehabilitation  ONSET DATE: 10/20/22  SUBJECTIVE:   SUBJECTIVE STATEMENT: Goes back to MD on Wednesday 10/2.  Reports compliance with HEP and overall doing well.   Evaluation: S/p right Achilles tendon surgery at [facility] 10/20/22; reattached the tendon and shaved down bone spur; put in cast for 6 weeks NWBing; was using a knee scooter and now in CAM boot until 10/2.  Now having some trouble with left leg due to right leg being in walking boot.  Some difficulty with navigating steps  PERTINENT HISTORY: S/p right THA 2014; right shoulder surgery 2019 and left elbow twice PAIN:  Are you having pain? Yes: NPRS scale: 1/10 Pain location: right  achilles Pain description: burning Aggravating factors: unknown Relieving factors: put weight on it  PRECAUTIONS: Fall  WEIGHT BEARING RESTRICTIONS:  WBAT right foot  FALLS:  Has patient fallen in last 6 months? 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  PLOF: Independent  PATIENT GOALS: get back to work  NEXT MD VISIT: 12/22/22  OBJECTIVE:   DIAGNOSTIC FINDINGS:   PATIENT SURVEYS:  LEFS 37/80 46.3 %  COGNITION: Overall cognitive status: Within functional limits for tasks assessed     SENSATION: Mild numbness in right toes  EDEMA:  Normal for this time s/p   POSTURE: rounded shoulders, forward head, and flexed trunk   PALPATION: Tender at and around incision; mild redness noted around incision but no oozing or draining noted  LOWER EXTREMITY ROM:  Active ROM Right eval Left eval  Hip flexion    Hip extension    Hip abduction    Hip adduction    Hip internal rotation    Hip external rotation    Knee  flexion    Knee extension    Ankle dorsiflexion 4 10  Ankle plantarflexion 26 46  Ankle inversion 24 26  Ankle eversion 10 14   (Blank rows = not tested)  LOWER EXTREMITY MMT:  MMT Right eval Left eval  Hip flexion    Hip extension    Hip abduction    Hip adduction    Hip internal rotation    Hip external rotation    Knee flexion    Knee extension    Ankle dorsiflexion 3   Ankle plantarflexion 3-   Ankle inversion 3   Ankle eversion 3    (Blank rows = not tested)   FUNCTIONAL TESTS:  5 times sit to stand: 13.52 no UE assist; shift to the left  GAIT: Distance walked: 50 ft in clinic Assistive device utilized: Single point cane, Walker - 2 wheeled, Shower bench, bed side commode, Ramped entry, and knee scooter Level of assistance: SBA Comments: CAM boot right foot   TODAY'S TREATMENT:                                                                                                                               DATE:  12/15/22 Seated heel raise/toe raise 20X each  Heelslides to increase DF 10X  Gastroc stretch with towel 2X20"  Toe curls on towel 1 minute  Windshield wipers with towel 15X  Marble pick up, 7 marbles 1X (5 minutes, only picked up 2 of 7)  Theraband strengthening Red 10X each direction (PF, DF, INV, EV) Scar massage instruction  12/13/22 physical therapy evaluation and HEP instruction    PATIENT EDUCATION:  Education details: Patient educated on exam findings, POC, scope of PT, HEP, and what to expect next visit. Person educated: Patient Education method: Explanation, Demonstration, and Handouts Education comprehension: verbalized understanding, returned demonstration, verbal cues required, and tactile cues required   HOME EXERCISE PROGRAM: Access Code: [email or web address] URL: [email or web address]  Date: 12/13/2022 Prepared by: [facility] - [facility]hab Exercises - Seated Heel Toe Raises  - 2 x daily - 7 x weekly - 1 sets - 10 reps - Seated Heel Slide  - 2 x daily - 7 x weekly - 1 sets - 10 reps - 3 sec hold - Long Sitting Calf Stretch with Strap  - 1 x daily - 7 x weekly - 1 sets - 5 reps - 20 sec hold  Date: 12/15/2022 Prepared by: Emeline Gins Exercises - Seated Ankle Plantar Flexion with Resistance Loop  - 2 x daily - 7 x weekly - 2 sets - 10 reps - Seated Ankle Eversion with Resistance  - 2 x daily - 7 x weekly - 2 sets - 10 reps - Seated Ankle Dorsiflexion with Resistance  - 2 x daily - 7 x weekly - 2 sets - 10 reps - Seated Figure 4 Ankle Inversion with Resistance  - 2 x daily - 7 x weekly - 2 sets - 10 reps  ASSESSMENT:  CLINICAL IMPRESSION: Reviewed goals and POC moving forward.  Pt able to recall and complete established HEP correctly.  Additional exercises added including 4 way ankle strengthening with red thera band and given written instructions for home.  Spouse with patient today who was educated on scar massage for posterior ankle.  CG was able  to demonstrate correctly and pt reported overall comfort.  Marble pickup task was very challenging for patient, only successfully picking up 2/7 in 5 minutes time period.  Pt continues to ambulate with CAM boot, no AD but does report making short trips without CAM to restroom.  Encouraged heel to toe gait on Rt as reports he's only been walking on his toe on the right and may be why his Lt calf is now sore.  Patient will continue to benefit from skilled physical therapy services to address his deficits, reduce pain and improve level of function with ADLs and functional mobility tasks.   OBJECTIVE IMPAIRMENTS: Abnormal gait, decreased activity tolerance, decreased mobility, difficulty walking, decreased ROM, decreased strength, increased fascial restrictions, impaired perceived functional ability, and pain.   ACTIVITY LIMITATIONS: carrying, lifting, bending, standing, squatting, stairs, transfers, and locomotion level  PARTICIPATION LIMITATIONS: meal prep, cleaning, laundry, driving, shopping, occupation, and yard work  [facility] POTENTIAL: Good  CLINICAL DECISION MAKING: Stable/uncomplicated  EVALUATION COMPLEXITY: Low   GOALS: Goals reviewed with patient? No  SHORT TERM GOALS: Target date: 12/27/2022 patient will be independent with initial HEP  Baseline: Goal status: INITIAL  2.  Patient will self report 30% improvement to improve tolerance for functional activity  Baseline:  Goal status: INITIAL   LONG TERM GOALS: Target date: 01/10/2023  Patient will be independent in self management strategies to improve quality of life and functional outcomes.  Baseline:  Goal status: INITIAL  2.  Patient will self report 50% improvement to improve tolerance for functional activity  Baseline:  Goal status: INITIAL  3.  Patient will increase right  leg MMTs to 4-5/5 without pain to promote return to ambulation community distances with minimal deviation.  Baseline: see above Goal status:  INITIAL  4.  Patient with ambulate in regular shoes x 300 ft in 2 min or less with minimal gait deviation to demonstrate improved functional mobility and gait Baseline:  Goal status: INITIAL  5.  Patient will increase right ankle AROM by 20 degrees total to improve ability to navigate steps and uneven surfaces safely.  Baseline:  Goal status: INITIAL  6.  Patient will increased score on LEFS by 10 points  (47) to demonstrate improved perceived functional mobility.  Baseline: 37/80 Goal status: INITIAL   PLAN:  PT FREQUENCY: 2x/week  PT DURATION: 4 weeks  PLANNED INTERVENTIONS: Therapeutic exercises, Therapeutic activity, Neuromuscular re-education, Balance training, Gait training, Patient/Family education, Joint manipulation, Joint mobilization, Stair training, Orthotic/Fit training, DME instructions, Aquatic Therapy, Dry Needling, Electrical stimulation, Spinal manipulation, Spinal mobilization, Cryotherapy, Moist heat, Compression bandaging, scar mobilization, Splintting, Taping, Traction, Ultrasound, Ionotophoresis 4mg /ml Dexamethasone, and Manual therapy   PLAN FOR NEXT SESSION: Review HEP and goals; progress right ankle mobility and strength as able; progress to shoe when MD ok; marble pickup, towel crunches; 4 way theraband  1:02 PM, 12/15/22 Vanette Noguchi Small Lynch MPT [facility] [place] [phone] Ph:[phone]

## 2022-12-23 ENCOUNTER — Ambulatory Visit (HOSPITAL_COMMUNITY): Payer: BC Managed Care – PPO

## 2022-12-23 ENCOUNTER — Ambulatory Visit (HOSPITAL_COMMUNITY): Payer: BC Managed Care – PPO | Attending: Podiatry | Admitting: Physical Therapy

## 2022-12-23 DIAGNOSIS — R262 Difficulty in walking, not elsewhere classified: Secondary | ICD-10-CM | POA: Diagnosis present

## 2022-12-23 DIAGNOSIS — M7660 Achilles tendinitis, unspecified leg: Secondary | ICD-10-CM | POA: Insufficient documentation

## 2022-12-23 NOTE — Therapy (Addendum)
OUTPATIENT PHYSICAL THERAPY TREATMENT   Patient Name: Blake Solis MRN: [id number] DOB:1956-02-21, 67 y.o., male Today's Date: 12/23/2022  END OF SESSION:  PT End of Session - 12/23/22 1058     Visit Number 3    Number of Visits 8    Date for PT Re-Evaluation 01/10/23    Authorization Type BC[employer]    Authorization Time Period 5000 ded; 2% coinsurance; needs auth    PT Start Time 0850    PT Stop Time 0930    PT Time Calculation (min) 40 min    Activity Tolerance Patient tolerated treatment well    Behavior During Therapy [facility] for tasks assessed/performed              Past Medical History:  Diagnosis Date   Arthritis    Bronchitis, allergic    Essential hypertension    On several medications.   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   Wears glasses    Past Surgical History:  Procedure Laterality Date   ACHILLES TENDON SURGERY Right 10/20/2022   Procedure: ACHILLES TENDON REPAIR;  Surgeon: Kieth Brightly, DPM;  Location: [facility];  Service: Podiatry;  Laterality: Right;   CARDIAC CATHETERIZATION  04/04/2002   Images reviewed: Normal coronaries   COLONOSCOPY N/A 09/06/2019   Procedure: COLONOSCOPY;  Surgeon: Malissa Hippo, MD;  [facility]: [facility];  Service: Endoscopy;  Laterality: N/A;  135   CYST EXCISION Right    cyst removed from arm   ELBOW SURGERY Left    HIP ARTHROPLASTY Right    KIDNEY DONATION Left    Was donor for his brother   KNEE ARTHROSCOPY Right 2013   lft ulnar nerve removed     decompression   MASS EXCISION Left 09/14/2021   Procedure: EXCISION SOFT TISSUE MASS LEFT FOOT;  Surgeon: Kieth Brightly, DPM;  Location: [facility];  Service: Podiatry;  Laterality: Left;  MAC WITH LOCAL    NEPHRECTOMY Left    left, donated to his brother, EF 55-60%...   OSTECTOMY Right 10/20/2022   Procedure: CALCANEAL PARTIAL EXCISION, Flexor digitorum longus tendon transfer, right;  Surgeon: Kieth Brightly, DPM;  Location:  SURGERY CENTER;  Service: Podiatry;  Laterality: Right;   POLYPECTOMY  09/06/2019   Procedure: POLYPECTOMY;  Surgeon: Malissa Hippo, MD;  [facility]: [facility];  Service: Endoscopy;;   SCROTAL EXPLORATION N/A 01/01/2021   Procedure: SCROTUM EXPLORATION- excision of scrotal sebacous cyst;  Surgeon: Malen Gauze, MD;  Location: [facility];  Service: Urology;  Laterality: N/A;   SCROTAL EXPLORATION N/A 02/08/2022   Procedure: SCROTUM EXPLORATION- excision of cyst;  Surgeon: Malen Gauze, MD;  Location: [facility];  Service: Urology;  Laterality: N/A;   SHOULDER ARTHROSCOPY WITH ROTATOR CUFF REPAIR AND SUBACROMIAL DECOMPRESSION Right 10/14/2017   Procedure: RIGHT SHOULDER ARTHROSCOPY WITH EXTENSIVE DEBRIDEMENT, SUBACROMIAL DECOMPRESSION, DISTAL CLAVICLE EXCISION AND BICEPS TENODYSIS;  Surgeon: Tarry Kos, MD;  Location:  SURGERY CENTER;  Service: Orthopedics;  Laterality: Right;   TOTAL HIP ARTHROPLASTY Right 06/07/2012   Procedure: TOTAL HIP ARTHROPLASTY ANTERIOR APPROACH;  Surgeon: Eldred Manges, MD;  Location: [facility];  Service: Orthopedics;  Laterality: Right;  Right Total Hip Arthroplasty-Anterior Approach  TRANSTHORACIC ECHOCARDIOGRAM  04/28/2020   [facility]) EF 65 to 70%.  No or WMA.  GR 1 DD.  Mildly thickened aortic valve.  Mild to moderately dilated left atrium.  Normal RV size and function.  Mild RA dilation.:   ULNAR NERVE TRANSPOSITION Left 11/13/2020   Procedure: left elbow ulnar nerve neurolysis, ganglion cyst removal;  Surgeon: Tarry Kos, MD;  Location: [facility];  Service: Orthopedics;  Laterality: Left;   Patient Active Problem List   Diagnosis Date Noted   Unilateral primary osteoarthritis, right  hip 03/25/2022   Symptomatic bradycardia 12/04/2021   Scrotal sebaceous cyst 12/24/2020   Ganglion of left elbow 10/28/2020   Chest pain with high risk for cardiac etiology 04/29/2020   Renal cyst 01/21/2020   Benign prostatic hyperplasia with urinary obstruction 01/21/2020   Nocturia 01/21/2020   S/P arthroscopy of right shoulder 11/01/2017   Superior glenoid labrum lesion of right shoulder    Arthrosis of right acromioclavicular joint    Tendinopathy of rotator cuff, right    Impingement syndrome of right shoulder    Nontraumatic tear of right supraspinatus tendon 09/29/2017   Cubital tunnel syndrome on left 05/13/2017   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) 06/07/2012    Class: Diagnosis of   Essential hypertension 07/08/2010   Hyperlipidemia due to dietary fat intake 07/08/2010   Prostatitis 07/08/2010   Degenerative disc disease 07/08/2010   Hydrocele of testis 07/08/2010   Obesity (BMI 35.0-39.9 without comorbidity) 09/08/2009    PCP: [facility]  REFERRING PROVIDER: Kieth Brightly, DPM  REFERRING DIAG: M76.60 (ICD-10-CM) - Achilles tendinitis  THERAPY DIAG:  Achilles tendinitis, unspecified laterality  Difficulty in walking, not elsewhere classified  Rationale for Evaluation and Treatment: Rehabilitation  ONSET DATE: 10/20/22  SUBJECTIVE:   SUBJECTIVE STATEMENT: Pt states everything went well at MD yesterday.  Wants him to wean out of boot. Just a little discomfort noted today.    Evaluation: S/p right Achilles tendon surgery at [facility] 10/20/22; reattached the tendon and shaved down bone spur; put in cast for 6 weeks NWBing; was using a knee scooter and now in CAM boot until 10/2.  Now having some trouble with left leg due to right leg being in walking boot.  Some difficulty with navigating steps  PERTINENT HISTORY: S/p right THA 2014; right shoulder surgery 2019 and left elbow twice PAIN:  Are you having pain? Yes: NPRS scale:  1/10 Pain location: right achilles Pain description: burning Aggravating factors: unknown Relieving factors: put weight on it  PRECAUTIONS: Fall  WEIGHT BEARING RESTRICTIONS:  WBAT right foot  FALLS:  Has patient fallen in last 6 months? 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  PLOF: Independent  PATIENT GOALS: get back to work  NEXT MD VISIT: 12/22/22  OBJECTIVE:   DIAGNOSTIC FINDINGS:   PATIENT SURVEYS:  LEFS 37/80 46.3 %  COGNITION: Overall cognitive status: Within functional limits for tasks assessed     SENSATION: Mild numbness in right toes  EDEMA:  Normal for this time s/p   POSTURE: rounded shoulders, forward head, and flexed trunk   PALPATION: Tender at and around incision; mild redness noted around incision but no oozing or draining noted  LOWER EXTREMITY ROM:  Active ROM Right eval Left eval  Hip flexion    Hip extension    Hip abduction    Hip adduction    Hip internal rotation  Hip external rotation    Knee flexion    Knee extension    Ankle dorsiflexion 4 10  Ankle plantarflexion 26 46  Ankle inversion 24 26  Ankle eversion 10 14   (Blank rows = not tested)  LOWER EXTREMITY MMT:  MMT Right eval Left eval  Hip flexion    Hip extension    Hip abduction    Hip adduction    Hip internal rotation    Hip external rotation    Knee flexion    Knee extension    Ankle dorsiflexion 3   Ankle plantarflexion 3-   Ankle inversion 3   Ankle eversion 3    (Blank rows = not tested)   FUNCTIONAL TESTS:  5 times sit to stand: 13.52 no UE assist; shift to the left  GAIT: Distance walked: 50 ft in clinic Assistive device utilized: Single point cane, Walker - 2 wheeled, Shower bench, bed side commode, Ramped entry, and knee scooter Level of assistance: SBA Comments: CAM boot right foot   TODAY'S TREATMENT:                                                                                                                               DATE:  12/23/22 Standing with bil UE assist heelraises 2X10  Hip abduction 2X10 each  Hip extension 2X10 each  Plantar fascia stretch with step 3X30" each  Gait heel toe without CAM 150 feet  Vectors (3 way kick) 10X3" holds each LE with 1 UE assist  12/15/22 Seated heel raise/toe raise 20X each  Heelslides to increase DF 10X  Gastroc stretch with towel 2X20"  Toe curls on towel 1 minute  Windshield wipers with towel 15X  Marble pick up, 7 marbles 1X (5 minutes, only picked up 2 of 7)  Theraband strengthening Red 10X each direction (PF, DF, INV, EV) Scar massage instruction  12/13/22 physical therapy evaluation and HEP instruction    PATIENT EDUCATION:  Education details: Patient educated on exam findings, POC, scope of PT, HEP, and what to expect next visit. Person educated: Patient Education method: Explanation, Demonstration, and Handouts Education comprehension: verbalized understanding, returned demonstration, verbal cues required, and tactile cues required   HOME EXERCISE PROGRAM: Access Code: [email or web address] URL: [email or web address]  Date: 12/13/2022 Prepared by: [facility] - Rehab Exercises - Seated Heel Toe Raises  - 2 x daily - 7 x weekly - 1 sets - 10 reps - Seated Heel Slide  - 2 x daily - 7 x weekly - 1 sets - 10 reps - 3 sec hold - Long Sitting Calf Stretch with Strap  - 1 x daily - 7 x weekly - 1 sets - 5 reps - 20 sec hold  Date: 12/15/2022 Prepared by: Emeline Gins Exercises - Seated Ankle Plantar Flexion with Resistance Loop  - 2 x daily - 7 x weekly - 2 sets - 10 reps - Seated Ankle Eversion with Resistance  - 2 x  daily - 7 x weekly - 2 sets - 10 reps - Seated Ankle Dorsiflexion with Resistance  - 2 x daily - 7 x weekly - 2 sets - 10 reps - Seated Figure 4 Ankle Inversion with Resistance  - 2 x daily - 7 x weekly - 2 sets - 10 reps  Date: 12/23/2022 Prepared by: Emeline Gins Exercises - Plantar Fascia  Stretch on Step  - 2 x daily - 7 x weekly - 2 sets - 3 reps - 30 sec hold - Standing Heel Raise with Support  - 2 x daily - 7 x weekly - 2 sets - 10 reps - Standing Hip Abduction AROM  - 2 x daily - 7 x weekly - 2 sets - 10 reps - Standing Hip Extension  - 2 x daily - 7 x weekly - 2 sets - 10 reps - Standing 3-Way Kick  - 2 x daily - 7 x weekly - 2 sets - 10 reps - 3 sec hold  ASSESSMENT:  CLINICAL IMPRESSION: Pt comes today stating the MD instructed him to discontinue his CAM boot this Monday (10/7).  States he's been walking around the house in his socks, no pain or issues just stiffness and weakness.  Progressed exercises this session to begin standing strengthening and focus on gait mechanics.   Pt did not bring his tennis shoe for Rt but given socks from clinic to progress into standing.  Pt instructed to bring his shoes next session and instructed to wean himself from CAM boot rather than discontinuing all together.  Also encouraged to continue walking on TM at current pace up to 10 minutes, progressing his speed OR time each week until returning to normal pace.  Pt given copies of exercises added today. Gait mechanics with focus on heel to toe gait on Rt.  Patient will continue to benefit from skilled physical therapy services to address his deficits, reduce pain and improve level of function with ADLs and functional mobility tasks.   OBJECTIVE IMPAIRMENTS: Abnormal gait, decreased activity tolerance, decreased mobility, difficulty walking, decreased ROM, decreased strength, increased fascial restrictions, impaired perceived functional ability, and pain.   ACTIVITY LIMITATIONS: carrying, lifting, bending, standing, squatting, stairs, transfers, and locomotion level  PARTICIPATION LIMITATIONS: meal prep, cleaning, laundry, driving, shopping, occupation, and yard work  [facility] POTENTIAL: Good  CLINICAL DECISION MAKING: Stable/uncomplicated  EVALUATION COMPLEXITY: Low   GOALS: Goals  reviewed with patient? No  SHORT TERM GOALS: Target date: 12/27/2022 patient will be independent with initial HEP  Baseline: Goal status: INITIAL  2.  Patient will self report 30% improvement to improve tolerance for functional activity  Baseline:  Goal status: INITIAL   LONG TERM GOALS: Target date: 01/10/2023  Patient will be independent in self management strategies to improve quality of life and functional outcomes.  Baseline:  Goal status: INITIAL  2.  Patient will self report 50% improvement to improve tolerance for functional activity  Baseline:  Goal status: INITIAL  3.  Patient will increase right  leg MMTs to 4-5/5 without pain to promote return to ambulation community distances with minimal deviation.  Baseline: see above Goal status: INITIAL  4.  Patient with ambulate in regular shoes x 300 ft in 2 min or less with minimal gait deviation to demonstrate improved functional mobility and gait Baseline:  Goal status: INITIAL  5.  Patient will increase right ankle AROM by 20 degrees total to improve ability to navigate steps and uneven surfaces safely.  Baseline:  Goal  status: INITIAL  6.  Patient will increased score on LEFS by 10 points  (47) to demonstrate improved perceived functional mobility.  Baseline: 37/80 Goal status: INITIAL   PLAN:  PT FREQUENCY: 2x/week  PT DURATION: 4 weeks  PLANNED INTERVENTIONS: Therapeutic exercises, Therapeutic activity, Neuromuscular re-education, Balance training, Gait training, Patient/Family education, Joint manipulation, Joint mobilization, Stair training, Orthotic/Fit training, DME instructions, Aquatic Therapy, Dry Needling, Electrical stimulation, Spinal manipulation, Spinal mobilization, Cryotherapy, Moist heat, Compression bandaging, scar mobilization, Splintting, Taping, Traction, Ultrasound, Ionotophoresis 4mg /ml Dexamethasone, and Manual therapy   PLAN FOR NEXT SESSION: Continue to progress right ankle mobility  and strength as able.  Next session to bring shoe and progress stabilization activities.  10:59 AM, 12/23/22 Lurena Nida, PTA/CLT [facility] Ph: [phone]

## 2022-12-28 ENCOUNTER — Ambulatory Visit (HOSPITAL_COMMUNITY): Payer: BC Managed Care – PPO | Admitting: Physical Therapy

## 2022-12-28 DIAGNOSIS — M7660 Achilles tendinitis, unspecified leg: Secondary | ICD-10-CM

## 2022-12-28 DIAGNOSIS — R262 Difficulty in walking, not elsewhere classified: Secondary | ICD-10-CM

## 2022-12-28 NOTE — Therapy (Signed)
OUTPATIENT PHYSICAL THERAPY TREATMENT   Patient Name: Blake Solis MRN: [id number] DOB:1955/12/19, 67 y.o., male Today's Date: 12/28/2022  END OF SESSION:  PT End of Session - 12/28/22 0910     Visit Number 4    Number of Visits 8    Date for PT Re-Evaluation 01/10/23    Authorization Type BC[employer]    Authorization Time Period 5000 ded; 2% coinsurance; needs auth    PT Start Time 0850    PT Stop Time 0930    PT Time Calculation (min) 40 min    Activity Tolerance Patient tolerated treatment well    Behavior During Therapy [facility] for tasks assessed/performed               Past Medical History:  Diagnosis Date   Arthritis    Bronchitis, allergic    Essential hypertension    On several medications.   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   Wears glasses    Past Surgical History:  Procedure Laterality Date   ACHILLES TENDON SURGERY Right 10/20/2022   Procedure: ACHILLES TENDON REPAIR;  Surgeon: Kieth Brightly, DPM;  Location: [facility];  Service: Podiatry;  Laterality: Right;   CARDIAC CATHETERIZATION  04/04/2002   Images reviewed: Normal coronaries   COLONOSCOPY N/A 09/06/2019   Procedure: COLONOSCOPY;  Surgeon: Malissa Hippo, MD;  [facility]: [facility];  Service: Endoscopy;  Laterality: N/A;  135   CYST EXCISION Right    cyst removed from arm   ELBOW SURGERY Left    HIP ARTHROPLASTY Right    KIDNEY DONATION Left    Was donor for his brother   KNEE ARTHROSCOPY Right 2013   lft ulnar nerve removed     decompression   MASS EXCISION Left 09/14/2021   Procedure: EXCISION SOFT TISSUE MASS LEFT FOOT;  Surgeon: Kieth Brightly, DPM;  Location: [facility];  Service: Podiatry;  Laterality: Left;  MAC WITH LOCAL    NEPHRECTOMY Left    left, donated to his brother, EF 55-60%...   OSTECTOMY Right 10/20/2022   Procedure: CALCANEAL PARTIAL EXCISION, Flexor digitorum longus tendon transfer, right;  Surgeon: Kieth Brightly, DPM;  Location: [facility];  Service: Podiatry;  Laterality: Right;   POLYPECTOMY  09/06/2019   Procedure: POLYPECTOMY;  Surgeon: Malissa Hippo, MD;  [facility]: [facility];  Service: Endoscopy;;   SCROTAL EXPLORATION N/A 01/01/2021   Procedure: SCROTUM EXPLORATION- excision of scrotal sebacous cyst;  Surgeon: Malen Gauze, MD;  Location: [facility];  Service: Urology;  Laterality: N/A;   SCROTAL EXPLORATION N/A 02/08/2022   Procedure: SCROTUM EXPLORATION- excision of cyst;  Surgeon: Malen Gauze, MD;  Location: [facility];  Service: Urology;  Laterality: N/A;   SHOULDER ARTHROSCOPY WITH ROTATOR CUFF REPAIR AND SUBACROMIAL DECOMPRESSION Right 10/14/2017   Procedure: RIGHT SHOULDER ARTHROSCOPY WITH EXTENSIVE DEBRIDEMENT, SUBACROMIAL DECOMPRESSION, DISTAL CLAVICLE EXCISION AND BICEPS TENODYSIS;  Surgeon: Tarry Kos, MD;  Location: [facility];  Service: Orthopedics;  Laterality: Right;   TOTAL HIP ARTHROPLASTY Right 06/07/2012   Procedure: TOTAL HIP ARTHROPLASTY ANTERIOR APPROACH;  Surgeon: Eldred Manges, MD;  Location: [facility];  Service: Orthopedics;  Laterality: Right;  Right Total Hip Arthroplasty-Anterior Approach  TRANSTHORACIC ECHOCARDIOGRAM  04/28/2020   [facility]) EF 65 to 70%.  No or WMA.  GR 1 DD.  Mildly thickened aortic valve.  Mild to moderately dilated left atrium.  Normal RV size and function.  Mild RA dilation.:   ULNAR NERVE TRANSPOSITION Left 11/13/2020   Procedure: left elbow ulnar nerve neurolysis, ganglion cyst removal;  Surgeon: Tarry Kos, MD;  Location: [facility];  Service: Orthopedics;  Laterality: Left;   Patient Active Problem List   Diagnosis Date Noted   Unilateral primary osteoarthritis, right  hip 03/25/2022   Symptomatic bradycardia 12/04/2021   Scrotal sebaceous cyst 12/24/2020   Ganglion of left elbow 10/28/2020   Chest pain with high risk for cardiac etiology 04/29/2020   Renal cyst 01/21/2020   Benign prostatic hyperplasia with urinary obstruction 01/21/2020   Nocturia 01/21/2020   S/P arthroscopy of right shoulder 11/01/2017   Superior glenoid labrum lesion of right shoulder    Arthrosis of right acromioclavicular joint    Tendinopathy of rotator cuff, right    Impingement syndrome of right shoulder    Nontraumatic tear of right supraspinatus tendon 09/29/2017   Cubital tunnel syndrome on left 05/13/2017   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) 06/07/2012    Class: Diagnosis of   Essential hypertension 07/08/2010   Hyperlipidemia due to dietary fat intake 07/08/2010   Prostatitis 07/08/2010   Degenerative disc disease 07/08/2010   Hydrocele of testis 07/08/2010   Obesity (BMI 35.0-39.9 without comorbidity) 09/08/2009    PCP: [facility]  REFERRING PROVIDER: Kieth Brightly, DPM  REFERRING DIAG: M76.60 (ICD-10-CM) - Achilles tendinitis  THERAPY DIAG:  Achilles tendinitis, unspecified laterality  Difficulty in walking, not elsewhere classified  Rationale for Evaluation and Treatment: Rehabilitation  ONSET DATE: 10/20/22  SUBJECTIVE:   SUBJECTIVE STATEMENT: Pt states his Lt knee has been bothering him since he had to start wearing the CAM boot. Comes today in tennis shoes, wearing the CAM boot very little.  No pain in his Rt ankle or foot.    Evaluation: S/p right Achilles tendon surgery at [facility] 10/20/22; reattached the tendon and shaved down bone spur; put in cast for 6 weeks NWBing; was using a knee scooter and now in CAM boot until 10/2.  Now having some trouble with left leg due to right leg being in walking boot.  Some difficulty with navigating steps  PERTINENT HISTORY: S/p right THA 2014; right shoulder surgery 2019 and  left elbow twice PAIN:  Are you having pain? Yes: NPRS scale: 0/10 Pain location: right achilles Pain description: burning Aggravating factors: unknown Relieving factors: put weight on it  PRECAUTIONS: Fall  WEIGHT BEARING RESTRICTIONS:  WBAT right foot  FALLS:  Has patient fallen in last 6 months? 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  PLOF: Independent  PATIENT GOALS: get back to work  NEXT MD VISIT: 12/22/22  OBJECTIVE:   DIAGNOSTIC FINDINGS:   PATIENT SURVEYS:  LEFS 37/80 46.3 %  COGNITION: Overall cognitive status: Within functional limits for tasks assessed     SENSATION: Mild numbness in right toes  EDEMA:  Normal for this time s/p   POSTURE: rounded shoulders, forward head, and flexed trunk   PALPATION: Tender at and around incision; mild redness noted around incision but no oozing or draining noted  LOWER EXTREMITY ROM:  Active ROM Right eval Left eval  Hip flexion    Hip extension  Hip abduction    Hip adduction    Hip internal rotation    Hip external rotation    Knee flexion    Knee extension    Ankle dorsiflexion 4 10  Ankle plantarflexion 26 46  Ankle inversion 24 26  Ankle eversion 10 14   (Blank rows = not tested)  LOWER EXTREMITY MMT:  MMT Right eval Left eval  Hip flexion    Hip extension    Hip abduction    Hip adduction    Hip internal rotation    Hip external rotation    Knee flexion    Knee extension    Ankle dorsiflexion 3   Ankle plantarflexion 3-   Ankle inversion 3   Ankle eversion 3    (Blank rows = not tested)   FUNCTIONAL TESTS:  5 times sit to stand: 13.52 no UE assist; shift to the left  GAIT: Distance walked: 50 ft in clinic Assistive device utilized: Single point cane, Walker - 2 wheeled, Shower bench, bed side commode, Ramped entry, and knee scooter Level of assistance: SBA Comments: CAM boot right foot   TODAY'S TREATMENT:                                                                                                                               DATE:  12/28/22 Standing:  heelraises 20X  Toeraises 10X  Plantar fascia stretch 2X30"  Tandem stance 2X30" each LE lead  SLS max Rt: 6", Lt: 4"  Hip abduction 2X10 each  Hip extension 2X10 each Vectors (3 way kick) 10X3" holds each LE with 1 UE assist Forward lunges onto 4" step 2X10 each no UE assist Gait heel toe no AD, tennis shoes Sit to stands no UE 2X10 each Nustep EOS level 4;  6 minutes  12/23/22 Standing with bil UE assist heelraises 2X10  Hip abduction 2X10 each  Hip extension 2X10 each  Plantar fascia stretch with step 3X30" each  Gait heel toe without CAM 150 feet  Vectors (3 way kick) 10X3" holds each LE with 1 UE assist  12/15/22 Seated heel raise/toe raise 20X each  Heelslides to increase DF 10X  Gastroc stretch with towel 2X20"  Toe curls on towel 1 minute  Windshield wipers with towel 15X  Marble pick up, 7 marbles 1X (5 minutes, only picked up 2 of 7)  Theraband strengthening Red 10X each direction (PF, DF, INV, EV) Scar massage instruction  12/13/22 physical therapy evaluation and HEP instruction    PATIENT EDUCATION:  Education details: Patient educated on exam findings, POC, scope of PT, HEP, and what to expect next visit. Person educated: Patient Education method: Explanation, Demonstration, and Handouts Education comprehension: verbalized understanding, returned demonstration, verbal cues required, and tactile cues required   HOME EXERCISE PROGRAM: Access Code: [email or web address] URL: [email or web address]  Date: 12/13/2022 Prepared by: [facility] - Rehab Exercises - Seated Heel Toe Raises  - 2 x daily - 7 x  weekly - 1 sets - 10 reps - Seated Heel Slide  - 2 x daily - 7 x weekly - 1 sets - 10 reps - 3 sec hold - Long Sitting Calf Stretch with Strap  - 1 x daily - 7 x weekly - 1 sets - 5 reps - 20 sec hold  Date: 12/15/2022 Prepared by:  Emeline Gins Exercises - Seated Ankle Plantar Flexion with Resistance Loop  - 2 x daily - 7 x weekly - 2 sets - 10 reps - Seated Ankle Eversion with Resistance  - 2 x daily - 7 x weekly - 2 sets - 10 reps - Seated Ankle Dorsiflexion with Resistance  - 2 x daily - 7 x weekly - 2 sets - 10 reps - Seated Figure 4 Ankle Inversion with Resistance  - 2 x daily - 7 x weekly - 2 sets - 10 reps  Date: 12/23/2022 Prepared by: Emeline Gins Exercises - Plantar Fascia Stretch on Step  - 2 x daily - 7 x weekly - 2 sets - 3 reps - 30 sec hold - Standing Heel Raise with Support  - 2 x daily - 7 x weekly - 2 sets - 10 reps - Standing Hip Abduction AROM  - 2 x daily - 7 x weekly - 2 sets - 10 reps - Standing Hip Extension  - 2 x daily - 7 x weekly - 2 sets - 10 reps - Standing 3-Way Kick  - 2 x daily - 7 x weekly - 2 sets - 10 reps - 3 sec hold  ASSESSMENT:  CLINICAL IMPRESSION: Pt comes today wearing normal tennis shoes.  Admits to using the CAM boot very little at this point.  States he's been doing his HEP/stretches and feels he is doing a lot better and ready to return to work.  Wife states his gait is his "normal" gait prior to surgery.  Pt able to complete all added exercises this session without c/o pain or issues.  Unable to maintain static balance greater than 3-4 seconds either LE without UE's.  Tandem stance can be maintained for full 30 seconds either LE's. Patient will continue to benefit from skilled physical therapy services to address his deficits, reduce pain and improve level of function with ADLs and functional mobility tasks.   OBJECTIVE IMPAIRMENTS: Abnormal gait, decreased activity tolerance, decreased mobility, difficulty walking, decreased ROM, decreased strength, increased fascial restrictions, impaired perceived functional ability, and pain.   ACTIVITY LIMITATIONS: carrying, lifting, bending, standing, squatting, stairs, transfers, and locomotion level  PARTICIPATION LIMITATIONS: meal  prep, cleaning, laundry, driving, shopping, occupation, and yard work  [facility] POTENTIAL: Good  CLINICAL DECISION MAKING: Stable/uncomplicated  EVALUATION COMPLEXITY: Low   GOALS: Goals reviewed with patient? No  SHORT TERM GOALS: Target date: 12/27/2022 patient will be independent with initial HEP  Baseline: Goal status: INITIAL  2.  Patient will self report 30% improvement to improve tolerance for functional activity  Baseline:  Goal status: INITIAL   LONG TERM GOALS: Target date: 01/10/2023  Patient will be independent in self management strategies to improve quality of life and functional outcomes.  Baseline:  Goal status: INITIAL  2.  Patient will self report 50% improvement to improve tolerance for functional activity  Baseline:  Goal status: INITIAL  3.  Patient will increase right  leg MMTs to 4-5/5 without pain to promote return to ambulation community distances with minimal deviation.  Baseline: see above Goal status: INITIAL  4.  Patient with ambulate in regular  shoes x 300 ft in 2 min or less with minimal gait deviation to demonstrate improved functional mobility and gait Baseline:  Goal status: INITIAL  5.  Patient will increase right ankle AROM by 20 degrees total to improve ability to navigate steps and uneven surfaces safely.  Baseline:  Goal status: INITIAL  6.  Patient will increased score on LEFS by 10 points  (47) to demonstrate improved perceived functional mobility.  Baseline: 37/80 Goal status: INITIAL   PLAN:  PT FREQUENCY: 2x/week  PT DURATION: 4 weeks  PLANNED INTERVENTIONS: Therapeutic exercises, Therapeutic activity, Neuromuscular re-education, Balance training, Gait training, Patient/Family education, Joint manipulation, Joint mobilization, Stair training, Orthotic/Fit training, DME instructions, Aquatic Therapy, Dry Needling, Electrical stimulation, Spinal manipulation, Spinal mobilization, Cryotherapy, Moist heat, Compression  bandaging, scar mobilization, Splintting, Taping, Traction, Ultrasound, Ionotophoresis 4mg /ml Dexamethasone, and Manual therapy   PLAN FOR NEXT SESSION: Complete reassess as pt reports RTW next week and will be unable to come.   10:13 AM, 12/28/22 Lurena Nida, PTA/CLT [facility] Ph: [phone]

## 2022-12-30 ENCOUNTER — Ambulatory Visit (HOSPITAL_COMMUNITY): Payer: BC Managed Care – PPO

## 2022-12-30 DIAGNOSIS — R262 Difficulty in walking, not elsewhere classified: Secondary | ICD-10-CM

## 2022-12-30 DIAGNOSIS — M7660 Achilles tendinitis, unspecified leg: Secondary | ICD-10-CM

## 2022-12-30 NOTE — Therapy (Signed)
OUTPATIENT PHYSICAL THERAPY DISCHARGE PHYSICAL THERAPY DISCHARGE SUMMARY  Visits from Start of Care: 5  Current functional level related to goals / functional outcomes: See below   Remaining deficits: See below   Education / Equipment: HEP   Patient agrees to discharge. Patient goals were met. Patient is being discharged due to meeting the stated rehab goals.    Patient Name: EROL ODAM MRN: [id number] DOB:[birth year], 67 y.o., male Today's Date: 12/30/2022  END OF SESSION:  PT End of Session - 12/30/22 0930     Visit Number 5    Number of Visits 8    Date for PT Re-Evaluation 01/10/23    Authorization Type BCBS    Authorization Time Period 5000 ded; 2% coinsurance; needs auth    PT Start Time 0930    PT Stop Time 1010    PT Time Calculation (min) 40 min    Activity Tolerance Patient tolerated treatment well    Behavior During Therapy WFL for tasks assessed/performed               Past Medical History:  Diagnosis Date   Arthritis    Bronchitis, allergic    Essential hypertension    On several medications.   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   Wears glasses    Past Surgical History:  Procedure Laterality Date   ACHILLES TENDON SURGERY Right 10/20/2022   Procedure: ACHILLES TENDON REPAIR;  Surgeon: Kieth Brightly, DPM;  Location: [facility];  Service: Podiatry;  Laterality: Right;   CARDIAC CATHETERIZATION  04/04/2002   Images reviewed: Normal coronaries   COLONOSCOPY N/A 09/06/2019   Procedure: COLONOSCOPY;  Surgeon: Malissa Hippo, MD;  [facility];  Service: Endoscopy;  Laterality: N/A;  135   CYST EXCISION Right    cyst removed from arm   ELBOW SURGERY Left    HIP ARTHROPLASTY Right     KIDNEY DONATION Left    Was donor for his brother   KNEE ARTHROSCOPY Right 2013   lft ulnar nerve removed     decompression   MASS EXCISION Left 09/14/2021   Procedure: EXCISION SOFT TISSUE MASS LEFT FOOT;  Surgeon: Kieth Brightly, DPM;  Location: [facility];  Service: Podiatry;  Laterality: Left;  MAC WITH LOCAL   NEPHRECTOMY Left    left, donated to his brother, EF 55-60%...   OSTECTOMY Right 10/20/2022   Procedure: CALCANEAL PARTIAL EXCISION, Flexor digitorum longus tendon transfer, right;  Surgeon: Kieth Brightly, DPM;  Location: [facility];  Service: Podiatry;  Laterality: Right;   POLYPECTOMY  09/06/2019   Procedure: POLYPECTOMY;  Surgeon: Malissa Hippo, MD;  [facility];  Service: Endoscopy;;   SCROTAL EXPLORATION N/A 01/01/2021   Procedure: SCROTUM EXPLORATION- excision of scrotal sebacous cyst;  Surgeon: Malen Gauze, MD;  Location: [facility];  Service: Urology;  Laterality: N/A;   SCROTAL EXPLORATION N/A 02/08/2022   Procedure: SCROTUM EXPLORATION- excision of cyst;  Surgeon: Malen Gauze, MD;  Location: [facility];  Service: Urology;  Laterality: N/A;   SHOULDER ARTHROSCOPY WITH ROTATOR CUFF REPAIR AND SUBACROMIAL DECOMPRESSION Right 10/14/2017   Procedure: RIGHT SHOULDER ARTHROSCOPY WITH EXTENSIVE DEBRIDEMENT, SUBACROMIAL DECOMPRESSION, DISTAL CLAVICLE EXCISION AND BICEPS  TENODYSIS;  Surgeon: Tarry Kos, MD;  Location: [facility];  Service: Orthopedics;  Laterality: Right;   TOTAL HIP ARTHROPLASTY Right 06/07/2012   Procedure: TOTAL HIP ARTHROPLASTY ANTERIOR APPROACH;  Surgeon: Eldred Manges, MD;  [facility];  Service: Orthopedics;  Laterality: Right;  Right Total Hip Arthroplasty-Anterior Approach   TRANSTHORACIC ECHOCARDIOGRAM  04/28/2020   [facility]) EF 65 to 70%.  No or WMA.  GR 1 DD.  Mildly thickened aortic valve.  Mild to moderately dilated left atrium.  Normal RV size and function.   Mild RA dilation.:   ULNAR NERVE TRANSPOSITION Left 11/13/2020   Procedure: left elbow ulnar nerve neurolysis, ganglion cyst removal;  Surgeon: Tarry Kos, MD;  Location: [facility];  Service: Orthopedics;  Laterality: Left;   Patient Active Problem List   Diagnosis Date Noted   Unilateral primary osteoarthritis, right hip 03/25/2022   Symptomatic bradycardia 12/04/2021   Scrotal sebaceous cyst 12/24/2020   Ganglion of left elbow 10/28/2020   Chest pain with high risk for cardiac etiology 04/29/2020   Renal cyst 01/21/2020   Benign prostatic hyperplasia with urinary obstruction 01/21/2020   Nocturia 01/21/2020   S/P arthroscopy of right shoulder 11/01/2017   Superior glenoid labrum lesion of right shoulder    Arthrosis of right acromioclavicular joint    Tendinopathy of rotator cuff, right    Impingement syndrome of right shoulder    Nontraumatic tear of right supraspinatus tendon 09/29/2017   Cubital tunnel syndrome on left 05/13/2017   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) 06/07/2012    Class: Diagnosis of   Essential hypertension 07/08/2010   Hyperlipidemia due to dietary fat intake 07/08/2010   Prostatitis 07/08/2010   Degenerative disc disease 07/08/2010   Hydrocele of testis 07/08/2010   Obesity (BMI 35.0-39.9 without comorbidity) 09/08/2009    PCP: [facility]  REFERRING PROVIDER: Kieth Brightly, DPM  REFERRING DIAG: M76.60 (ICD-10-CM) - Achilles tendinitis  THERAPY DIAG:  Achilles tendinitis, unspecified laterality  Difficulty in walking, not elsewhere classified  Rationale for Evaluation and Treatment: Rehabilitation  ONSET DATE: 10/20/22  SUBJECTIVE:   SUBJECTIVE STATEMENT: Patient feels he is "85%" better; still having a little swelling; left leg is bothering him some; wearing tennis shoes only now.    Evaluation: S/p right Achilles tendon surgery at [facility] 10/20/22; reattached the tendon and shaved down bone  spur; put in cast for 6 weeks NWBing; was using a knee scooter and now in CAM boot until 10/2.  Now having some trouble with left leg due to right leg being in walking boot.  Some difficulty with navigating steps  PERTINENT HISTORY: S/p right THA 2014; right shoulder surgery 2019 and left elbow twice PAIN:  Are you having pain? Yes: NPRS scale: 0/10 Pain location: right achilles Pain description: burning Aggravating factors: unknown Relieving factors: put weight on it  PRECAUTIONS: Fall  WEIGHT BEARING RESTRICTIONS:  WBAT right foot  FALLS:  Has patient fallen in last 6 months? 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  PLOF: Independent  PATIENT GOALS: get back to work  NEXT MD VISIT: 12/22/22  OBJECTIVE:   DIAGNOSTIC FINDINGS:   PATIENT SURVEYS:  LEFS 37/80 46.3 %  COGNITION: Overall cognitive status: Within functional limits for tasks assessed     SENSATION: Mild numbness in right toes  EDEMA:  Normal for this time s/p   POSTURE: rounded shoulders, forward head, and  flexed trunk   PALPATION: Tender at and around incision; mild redness noted around incision but no oozing or draining noted  LOWER EXTREMITY ROM:  Active ROM Right eval Left eval Right 12/30/22  Hip flexion     Hip extension     Hip abduction     Hip adduction     Hip internal rotation     Hip external rotation     Knee flexion     Knee extension     Ankle dorsiflexion 4 10 10   Ankle plantarflexion 26 46 38  Ankle inversion 24 26 24   Ankle eversion 10 14 14    (Blank rows = not tested)  LOWER EXTREMITY MMT:  MMT Right eval Left eval Right 12/30/22  Hip flexion     Hip extension     Hip abduction     Hip adduction     Hip internal rotation     Hip external rotation     Knee flexion     Knee extension     Ankle dorsiflexion 3  5  Ankle plantarflexion 3-  4+  Ankle inversion 3  5  Ankle eversion 3  5   (Blank rows = not  tested)   FUNCTIONAL TESTS:  5 times sit to stand: 13.52 no UE assist; shift to the left  GAIT: Distance walked: 50 ft in clinic Assistive device utilized: Single point cane, Walker - 2 wheeled, Shower bench, bed side commode, Ramped entry, and knee scooter Level of assistance: SBA Comments: CAM boot right foot   TODAY'S TREATMENT:                                                                                                                              DATE:  12/30/22 Discharge note 5 times sit to stand 10.95 sec 2 MWT 357 ft AROM and MMT's LEFS 63/80;  78.8%  12/28/22 Standing:  heelraises 20X  Toeraises 10X  Plantar fascia stretch 2X30"  Tandem stance 2X30" each LE lead  SLS max Rt: 6", Lt: 4"  Hip abduction 2X10 each  Hip extension 2X10 each Vectors (3 way kick) 10X3" holds each LE with 1 UE assist Forward lunges onto 4" step 2X10 each no UE assist Gait heel toe no AD, tennis shoes Sit to stands no UE 2X10 each Nustep EOS level 4;  6 minutes  12/23/22 Standing with bil UE assist heelraises 2X10  Hip abduction 2X10 each  Hip extension 2X10 each  Plantar fascia stretch with step 3X30" each  Gait heel toe without CAM 150 feet  Vectors (3 way kick) 10X3" holds each LE with 1 UE assist  12/15/22 Seated heel raise/toe raise 20X each  Heelslides to increase DF 10X  Gastroc stretch with towel 2X20"  Toe curls on towel 1 minute  Windshield wipers with towel 15X  Marble pick up, 7 marbles 1X (5 minutes, only picked up 2 of 7)  Theraband strengthening Red 10X each direction (  PF, DF, INV, EV) Scar massage instruction  12/13/22 physical therapy evaluation and HEP instruction    PATIENT EDUCATION:  Education details: Patient educated on exam findings, POC, scope of PT, HEP, and what to expect next visit. Person educated: Patient Education method: Explanation, Demonstration, and Handouts Education comprehension: verbalized understanding, returned demonstration, verbal  cues required, and tactile cues required   HOME EXERCISE PROGRAM: Access Code: [email or web address] URL: [email or web address]  Date: 12/13/2022 Prepared by: [facility] - Rehab Exercises - Seated Heel Toe Raises  - 2 x daily - 7 x weekly - 1 sets - 10 reps - Seated Heel Slide  - 2 x daily - 7 x weekly - 1 sets - 10 reps - 3 sec hold - Long Sitting Calf Stretch with Strap  - 1 x daily - 7 x weekly - 1 sets - 5 reps - 20 sec hold  Date: 12/15/2022 Prepared by: Emeline Gins Exercises - Seated Ankle Plantar Flexion with Resistance Loop  - 2 x daily - 7 x weekly - 2 sets - 10 reps - Seated Ankle Eversion with Resistance  - 2 x daily - 7 x weekly - 2 sets - 10 reps - Seated Ankle Dorsiflexion with Resistance  - 2 x daily - 7 x weekly - 2 sets - 10 reps - Seated Figure 4 Ankle Inversion with Resistance  - 2 x daily - 7 x weekly - 2 sets - 10 reps  Date: 12/23/2022 Prepared by: Emeline Gins Exercises - Plantar Fascia Stretch on Step  - 2 x daily - 7 x weekly - 2 sets - 3 reps - 30 sec hold - Standing Heel Raise with Support  - 2 x daily - 7 x weekly - 2 sets - 10 reps - Standing Hip Abduction AROM  - 2 x daily - 7 x weekly - 2 sets - 10 reps - Standing Hip Extension  - 2 x daily - 7 x weekly - 2 sets - 10 reps - Standing 3-Way Kick  - 2 x daily - 7 x weekly - 2 sets - 10 reps - 3 sec hold  ASSESSMENT:  CLINICAL IMPRESSION: Discharge note today.  Patient has met all set rehab goals and is agreeable to discharge  OBJECTIVE IMPAIRMENTS: Abnormal gait, decreased activity tolerance, decreased mobility, difficulty walking, decreased ROM, decreased strength, increased fascial restrictions, impaired perceived functional ability, and pain.   ACTIVITY LIMITATIONS: carrying, lifting, bending, standing, squatting, stairs, transfers, and locomotion level  PARTICIPATION LIMITATIONS: meal prep, cleaning, laundry, driving, shopping, occupation, and yard work  [facility] POTENTIAL: Good  CLINICAL DECISION  MAKING: Stable/uncomplicated  EVALUATION COMPLEXITY: Low   GOALS: Goals reviewed with patient? No  SHORT TERM GOALS: Target date: 12/27/2022 patient will be independent with initial HEP  Baseline: Goal status: met  2.  Patient will self report 30% improvement to improve tolerance for functional activity  Baseline:  Goal status:met   LONG TERM GOALS: Target date: 01/10/2023  Patient will be independent in self management strategies to improve quality of life and functional outcomes.  Baseline:  Goal status: met  2.  Patient will self report 50% improvement to improve tolerance for functional activity  Baseline:  Goal status: met  3.  Patient will increase right  leg MMTs to 4-5/5 without pain to promote return to ambulation community distances with minimal deviation.  Baseline: see above Goal status: INITIAL  4.  Patient with ambulate in regular shoes x 300 ft in 2 min or less with minimal gait deviation  to demonstrate improved functional mobility and gait Baseline:  Goal status: met  5.  Patient will increase right ankle AROM by 20 degrees total to improve ability to navigate steps and uneven surfaces safely.  Baseline:  Goal status: INITIAL  6.  Patient will increased score on LEFS by 10 points  (47) to demonstrate improved perceived functional mobility.  Baseline: 37/80; 63/80 78.8%  Goal status: met   PLAN:  PT FREQUENCY: 2x/week  PT DURATION: 4 weeks  PLANNED INTERVENTIONS: Therapeutic exercises, Therapeutic activity, Neuromuscular re-education, Balance training, Gait training, Patient/Family education, Joint manipulation, Joint mobilization, Stair training, Orthotic/Fit training, DME instructions, Aquatic Therapy, Dry Needling, Electrical stimulation, Spinal manipulation, Spinal mobilization, Cryotherapy, Moist heat, Compression bandaging, scar mobilization, Splintting, Taping, Traction, Ultrasound, Ionotophoresis 4mg /ml Dexamethasone, and Manual  therapy   PLAN FOR NEXT SESSION:discharge  9:59 AM, 12/30/22 Haile Bosler Small Donel Osowski MPT [facility] [place] [phone] [phone]

## 2023-01-04 ENCOUNTER — Ambulatory Visit (HOSPITAL_COMMUNITY)
Admission: RE | Admit: 2023-01-04 | Discharge: 2023-01-04 | Disposition: A | Discharge status: Home / Self Care | Payer: BC Managed Care – PPO | Source: Ambulatory Visit | Attending: Gastroenterology | Admitting: Gastroenterology

## 2023-01-04 ENCOUNTER — Encounter (HOSPITAL_COMMUNITY): Payer: BC Managed Care – PPO

## 2023-01-04 DIAGNOSIS — K824 Cholesterolosis of gallbladder: Secondary | ICD-10-CM | POA: Insufficient documentation

## 2023-01-06 ENCOUNTER — Encounter (HOSPITAL_COMMUNITY): Payer: BC Managed Care – PPO | Admitting: Physical Therapy

## 2023-01-11 ENCOUNTER — Ambulatory Visit: Payer: BC Managed Care – PPO | Admitting: Orthopaedic Surgery

## 2023-01-11 ENCOUNTER — Encounter: Payer: Self-pay | Admitting: Orthopaedic Surgery

## 2023-01-11 ENCOUNTER — Other Ambulatory Visit (INDEPENDENT_AMBULATORY_CARE_PROVIDER_SITE_OTHER): Payer: Self-pay

## 2023-01-11 ENCOUNTER — Encounter (HOSPITAL_COMMUNITY): Payer: BC Managed Care – PPO

## 2023-01-11 VITALS — BP 142/73 | HR 54 | Ht 68.5 in | Wt 243.0 lb

## 2023-01-11 DIAGNOSIS — M25552 Pain in left hip: Secondary | ICD-10-CM | POA: Diagnosis not present

## 2023-01-11 NOTE — Progress Notes (Signed)
Office Visit Note   Patient: Blake Solis           Date of Birth: 1955/10/16           MRN: [id number] Visit Date: 01/11/2023              Requested by: [facility] [address] [place],  [place] [address] PCP: [facility]   Assessment & Plan: Visit Diagnoses:  1. Pain in left hip     Plan: We reviewed his x-rays at this point I do not think he needs an MRI of his hip some of his symptoms may be aggravated after he has had the Achilles tendon repair on the right.  I plan to recheck him in 6 weeks.  His right total hip arthroplasty was done for avascular necrosis.  Follow-Up Instructions: Return in about 6 weeks (around 02/22/2023).   Orders:  Orders Placed This Encounter  Procedures   XR HIP UNILAT W OR W/O PELVIS 2-3 VIEWS LEFT   No orders of the defined types were placed in this encounter.     Procedures: No procedures performed   Clinical Data: No additional findings.   Subjective: Chief Complaint  Patient presents with   Left Knee - Pain   Left Hip - Pain    HPI 67 year old male returns with ongoing problems with left leg pain.  He states he had similar problems last year this time is bothering for the last 2 weeks pain laterally in the knee sometimes it feels like it catches he has pain laterally in the hip.  He had Achilles tendon repair on the right foot in July and thinks he may have put more pressure on his left leg with that as expected.  Right total hip arthroplasty doing well he has some lucent lines around the stem but no pain no problems ambulating.  Previous x-rays showed some mild left hip osteoarthritis.  Review of Systems all other systems noncontributory to HPI.  No history of gout.     Objective: Vital Signs: BP (!) 142/73   Pulse (!) 54   Ht 5' 8.5" (1.74 m)   Wt 243 lb (110.2 kg)   BMI 36.41 kg/m   Physical Exam Constitutional:      Appearance: He is well-developed.  HENT:     Head: Normocephalic and  atraumatic.     Right Ear: External ear normal.     Left Ear: External ear normal.  Eyes:     Pupils: Pupils are equal, round, and reactive to light.  Neck:     Thyroid: No thyromegaly.     Trachea: No tracheal deviation.  Cardiovascular:     Rate and Rhythm: Normal rate.  Pulmonary:     Effort: Pulmonary effort is normal.     Breath sounds: No wheezing.  Abdominal:     General: Bowel sounds are normal.     Palpations: Abdomen is soft.  Musculoskeletal:     Cervical back: Neck supple.  Skin:    General: Skin is warm and dry.     Capillary Refill: Capillary refill takes less than 2 seconds.  Neurological:     Mental Status: He is alert and oriented to person, place, and time.  Psychiatric:        Behavior: Behavior normal.        Thought Content: Thought content normal.        Judgment: Judgment normal.     Ortho Exam patient has some  sciatic notch tenderness on the left trochanteric bursal tenderness on the left some pain with internal rotation of his hip and groin pain with figure-of-four position or trying to cross his leg sitting.  Specialty Comments:  No specialty comments available.  Imaging: XR HIP UNILAT W OR W/O PELVIS 2-3 VIEWS LEFT  Result Date: 01/11/2023 [facility] pelvis frog-leg left hip obtained and reviewed.  Right total hip arthroplasty with some lucent lines around the proximal stem without subsidence unchanged from previous radiographs.  Left hip shows mild joint space narrowing without cystic formation or osteophytosis. Impression: Previous right total of arthroplasty with stable lucent lines around the stem.  Mild left hip osteoarthritis.    PMFS History: Patient Active Problem List   Diagnosis Date Noted   Unilateral primary osteoarthritis, right hip 03/25/2022   Symptomatic bradycardia 12/04/2021   Scrotal sebaceous cyst 12/24/2020   Ganglion of left elbow 10/28/2020   Chest pain with high risk for cardiac etiology 04/29/2020   Renal cyst 01/21/2020    Benign prostatic hyperplasia with urinary obstruction 01/21/2020   Nocturia 01/21/2020   S/P arthroscopy of right shoulder 11/01/2017   Superior glenoid labrum lesion of right shoulder    Arthrosis of right acromioclavicular joint    Tendinopathy of rotator cuff, right    Impingement syndrome of right shoulder    Nontraumatic tear of right supraspinatus tendon 09/29/2017   Cubital tunnel syndrome on left 05/13/2017   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   Wears glasses     Family History  Problem Relation Age of Onset   Colon cancer Brother 80   Neurologic Disorder Mother        Jeannett Senior - by report   Liver disease Father    Other Sister 53       Natural causes   Pancreatic cancer Brother 30       Unknown   Diabetes Mellitus II Brother        Died from DM-Coma @ 76   Diabetic kidney disease Brother 74       End-stage- s/p Txplant (Timithy was the Donor)   Lung cancer Brother 80   Other Sister 59   Healthy Sister        He does not know much details   Arthritis Sister    Diabetes type II Sister    Arthritis Sister    Stomach cancer Sister     Past Surgical History:  Procedure Laterality Date   ACHILLES TENDON SURGERY Right 10/20/2022   Procedure: ACHILLES TENDON REPAIR;  Surgeon:  Kieth Brightly, DPM;  Location: [facility];  Service: Podiatry;  Laterality: Right;   CARDIAC CATHETERIZATION  04/04/2002   Images reviewed: Normal coronaries   COLONOSCOPY N/A 09/06/2019   Procedure: COLONOSCOPY;  Surgeon:  Malissa Hippo, MD;  [facility];  Service: Endoscopy;  Laterality: N/A;  135   CYST EXCISION Right    cyst removed from arm   ELBOW SURGERY Left    HIP ARTHROPLASTY Right    KIDNEY DONATION Left    Was donor for his brother   KNEE ARTHROSCOPY Right 2013   lft ulnar nerve removed     decompression   MASS EXCISION Left 09/14/2021   Procedure: EXCISION SOFT TISSUE MASS LEFT FOOT;  Surgeon: Kieth Brightly, DPM;  Location: [facility];  Service: Podiatry;  Laterality: Left;  MAC WITH LOCAL   NEPHRECTOMY Left    left, donated to his brother, EF 55-60%...   OSTECTOMY Right 10/20/2022   Procedure: CALCANEAL PARTIAL EXCISION, Flexor digitorum longus tendon transfer, right;  Surgeon: Kieth Brightly, DPM;  Location: [facility];  Service: Podiatry;  Laterality: Right;   POLYPECTOMY  09/06/2019   Procedure: POLYPECTOMY;  Surgeon: Malissa Hippo, MD;  [facility];  Service: Endoscopy;;   SCROTAL EXPLORATION N/A 01/01/2021   Procedure: SCROTUM EXPLORATION- excision of scrotal sebacous cyst;  Surgeon: Malen Gauze, MD;  Location: [facility];  Service: Urology;  Laterality: N/A;   SCROTAL EXPLORATION N/A 02/08/2022   Procedure: SCROTUM EXPLORATION- excision of cyst;  Surgeon: Malen Gauze, MD;  Location: [facility];  Service: Urology;  Laterality: N/A;   SHOULDER ARTHROSCOPY WITH ROTATOR CUFF REPAIR AND SUBACROMIAL DECOMPRESSION Right 10/14/2017   Procedure: RIGHT SHOULDER ARTHROSCOPY WITH EXTENSIVE DEBRIDEMENT, SUBACROMIAL DECOMPRESSION, DISTAL CLAVICLE EXCISION AND BICEPS TENODYSIS;  Surgeon: Tarry Kos, MD;  Location: [facility];  Service: Orthopedics;  Laterality: Right;    TOTAL HIP ARTHROPLASTY Right 06/07/2012   Procedure: TOTAL HIP ARTHROPLASTY ANTERIOR APPROACH;  Surgeon: Eldred Manges, MD;  Location: [facility];  Service: Orthopedics;  Laterality: Right;  Right Total Hip Arthroplasty-Anterior Approach   TRANSTHORACIC ECHOCARDIOGRAM  04/28/2020   [facility]) EF 65 to 70%.  No or WMA.  GR 1 DD.  Mildly thickened aortic valve.  Mild to moderately dilated left atrium.  Normal RV size and function.  Mild RA dilation.:   ULNAR NERVE TRANSPOSITION Left 11/13/2020   Procedure: left elbow ulnar nerve neurolysis, ganglion cyst removal;  Surgeon: Tarry Kos, MD;  Location: [facility];  Service: Orthopedics;  Laterality: Left;   Social History   Occupational History   Occupation: Actuary: [employer]    Comment: Now called [employer]  Tobacco Use   Smoking status: Former    Current packs/day: 0.00    Average packs/day: 1 pack/day for 40.0 years (40.0 ttl pk-yrs)    Types: Cigarettes    Start date: 04/22/1958    Quit date: 04/22/1998    Years since quitting: 24.7   Smokeless tobacco: Never  Vaping Use   Vaping status: Never Used  Substance and Sexual Activity   Alcohol use: No   Drug use: No   Sexual activity: Yes

## 2023-01-13 ENCOUNTER — Telehealth: Payer: Self-pay | Admitting: *Deleted

## 2023-01-13 ENCOUNTER — Encounter (HOSPITAL_COMMUNITY): Payer: BC Managed Care – PPO

## 2023-01-13 NOTE — Telephone Encounter (Signed)
Pt called and would like the results of his [place]. Informed they are not in yet, but soon as they come in would call him.

## 2023-01-20 NOTE — Telephone Encounter (Signed)
Left message for return call.

## 2023-01-21 NOTE — Telephone Encounter (Signed)
Spoke to [place] at [facility] and he said he would get someone to read [place] today.

## 2023-01-25 ENCOUNTER — Other Ambulatory Visit: Payer: Self-pay | Admitting: *Deleted

## 2023-01-25 ENCOUNTER — Encounter: Payer: Self-pay | Admitting: *Deleted

## 2023-01-25 ENCOUNTER — Telehealth: Payer: Self-pay | Admitting: Gastroenterology

## 2023-01-25 DIAGNOSIS — K824 Cholesterolosis of gallbladder: Secondary | ICD-10-CM

## 2023-01-25 NOTE — Telephone Encounter (Signed)
See previous note

## 2023-01-25 NOTE — Telephone Encounter (Signed)
Pt called to follow up on [place], please call him at [phone]

## 2023-01-31 ENCOUNTER — Ambulatory Visit (HOSPITAL_COMMUNITY): Payer: BC Managed Care – PPO

## 2023-01-31 ENCOUNTER — Encounter (HOSPITAL_COMMUNITY): Payer: Self-pay

## 2023-01-31 ENCOUNTER — Ambulatory Visit (HOSPITAL_COMMUNITY)
Admission: RE | Admit: 2023-01-31 | Discharge: 2023-01-31 | Disposition: A | Discharge status: Home / Self Care | Payer: BC Managed Care – PPO | Source: Ambulatory Visit | Attending: Gastroenterology | Admitting: Gastroenterology

## 2023-01-31 DIAGNOSIS — K824 Cholesterolosis of gallbladder: Secondary | ICD-10-CM

## 2023-02-01 ENCOUNTER — Ambulatory Visit (HOSPITAL_COMMUNITY): Payer: BC Managed Care – PPO

## 2023-02-02 ENCOUNTER — Ambulatory Visit (HOSPITAL_COMMUNITY)
Admission: RE | Admit: 2023-02-02 | Discharge: 2023-02-02 | Disposition: A | Discharge status: Home / Self Care | Payer: BC Managed Care – PPO | Source: Ambulatory Visit | Attending: Gastroenterology | Admitting: Gastroenterology

## 2023-02-02 ENCOUNTER — Other Ambulatory Visit: Payer: Self-pay | Admitting: Gastroenterology

## 2023-02-02 DIAGNOSIS — K824 Cholesterolosis of gallbladder: Secondary | ICD-10-CM | POA: Insufficient documentation

## 2023-02-02 MED ORDER — GADOBUTROL 1 MMOL/ML IV SOLN
10.0000 mL | Freq: Once | INTRAVENOUS | Status: AC | PRN
  Administered 2023-02-02: 10 mL via INTRAVENOUS

## 2023-02-04 ENCOUNTER — Other Ambulatory Visit: Payer: Self-pay | Admitting: *Deleted

## 2023-02-04 DIAGNOSIS — R7989 Other specified abnormal findings of blood chemistry: Secondary | ICD-10-CM

## 2023-02-04 DIAGNOSIS — K824 Cholesterolosis of gallbladder: Secondary | ICD-10-CM

## 2023-02-22 ENCOUNTER — Ambulatory Visit: Payer: BC Managed Care – PPO | Admitting: Orthopaedic Surgery

## 2023-02-23 ENCOUNTER — Encounter: Payer: Self-pay | Admitting: Orthopaedic Surgery

## 2023-02-23 ENCOUNTER — Ambulatory Visit: Payer: BC Managed Care – PPO | Admitting: Orthopaedic Surgery

## 2023-02-23 VITALS — Ht 70.5 in | Wt 242.0 lb

## 2023-02-23 DIAGNOSIS — M25552 Pain in left hip: Secondary | ICD-10-CM | POA: Diagnosis not present

## 2023-02-23 NOTE — Progress Notes (Signed)
Office Visit Note   Patient: Blake Solis           Date of Birth: [birth year]           MRN: [id number] Visit Date: 02/23/2023              Requested by: [facility] [address] [place],  [place] [address] PCP: [facility]   Assessment & Plan: Visit Diagnoses:  1. Pain in left hip     Plan: Patient is doing better.  If he gets progression of symptoms he can return.  He is already had right total of arthroplasty but is present his symptoms are not severe enough that would require diagnostic MRI.  He may have a labral tear with his repetitive catching but is recently gotten better.  Follow-Up Instructions: No follow-ups on file.   Orders:  No orders of the defined types were placed in this encounter.  No orders of the defined types were placed in this encounter.     Procedures: No procedures performed   Clinical Data: No additional findings.   Subjective: Chief Complaint  Patient presents with   Left Hip - Follow-up   Left Knee - Follow-up    HPI 67 year old male follow-up right total of arthroplasty 10 years ago doing well.  He has had some improvement and states his left knee no longer hurts.  He still has some catching in his hip occasionally on the left hip with mild joint space narrowing but no significant hip osteoarthritis.  Patient states he is walking well occasionally feels a little sharp pain anteriorly in the left hip but it has improved.  Review of Systems all systems updated unchanged.   Objective: Vital Signs: Ht 5' 10.5" (1.791 m)   Wt 242 lb (109.8 kg)   BMI 34.23 kg/m   Physical Exam Constitutional:      Appearance: He is well-developed.  HENT:     Head: Normocephalic and atraumatic.     Right Ear: External ear normal.     Left Ear: External ear normal.  Eyes:     Pupils: Pupils are equal, round, and reactive to light.  Neck:     Thyroid: No thyromegaly.     Trachea: No tracheal deviation.  Cardiovascular:      Rate and Rhythm: Normal rate.  Pulmonary:     Effort: Pulmonary effort is normal.     Breath sounds: No wheezing.  Abdominal:     General: Bowel sounds are normal.     Palpations: Abdomen is soft.  Musculoskeletal:     Cervical back: Neck supple.  Skin:    General: Skin is warm and dry.     Capillary Refill: Capillary refill takes less than 2 seconds.  Neurological:     Mental Status: He is alert and oriented to person, place, and time.  Psychiatric:        Behavior: Behavior normal.        Thought Content: Thought content normal.        Judgment: Judgment normal.     Ortho Exam patient from sitting to standing easily range of motion is good leg lengths are equal ambulatory without limping.  Station lower extremities is intact.  Specialty Comments:  No specialty comments available.  Imaging: No results found.   PMFS History: Patient Active Problem List   Diagnosis Date Noted   Pain in left hip 02/23/2023   Symptomatic bradycardia 12/04/2021   Scrotal sebaceous cyst 12/24/2020  Ganglion of left elbow 10/28/2020   Chest pain with high risk for cardiac etiology 04/29/2020   Renal cyst 01/21/2020   Benign prostatic hyperplasia with urinary obstruction 01/21/2020   Nocturia 01/21/2020   S/P arthroscopy of right shoulder 11/01/2017   Superior glenoid labrum lesion of right shoulder    Arthrosis of right acromioclavicular joint    Tendinopathy of rotator cuff, right    Impingement syndrome of right shoulder    Nontraumatic tear of right supraspinatus tendon 09/29/2017   Cubital tunnel syndrome on left 05/13/2017   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   Wears glasses     Family History  Problem Relation Age of Onset   Colon cancer Brother 25   Neurologic Disorder Mother        Jeannett Senior - by report   Liver disease Father    Other Sister 88       Natural causes   Pancreatic cancer Brother 36       Unknown   Diabetes Mellitus II Brother        Died from DM-Coma @ 82   Diabetic kidney disease Brother 61       End-stage- s/p Txplant (Chibueze was the Donor)   Lung cancer Brother 41   Other Sister 20   Healthy Sister        He does not know much details   Arthritis Sister    Diabetes type II Sister    Arthritis Sister    Stomach cancer Sister     Past Surgical History:  Procedure Laterality Date   ACHILLES TENDON SURGERY Right 10/20/2022   Procedure: ACHILLES TENDON REPAIR;  Surgeon: Kieth Brightly, DPM;  Location: [facility];  Service: Podiatry;  Laterality: Right;   CARDIAC CATHETERIZATION  04/04/2002   Images reviewed: Normal coronaries   COLONOSCOPY N/A 09/06/2019   Procedure: COLONOSCOPY;  Surgeon: Malissa Hippo, MD;  [facility];  Service: Endoscopy;  Laterality: N/A;  135   CYST EXCISION Right    cyst removed from arm   ELBOW SURGERY Left    HIP ARTHROPLASTY Right    KIDNEY DONATION Left    Was donor for his brother   KNEE ARTHROSCOPY Right 2013   lft ulnar nerve removed     decompression   MASS EXCISION Left 09/14/2021   Procedure: EXCISION SOFT TISSUE MASS LEFT FOOT;  Surgeon: Kieth Brightly, DPM;  Location: [facility];  Service: Podiatry;  Laterality: Left;  MAC WITH LOCAL    NEPHRECTOMY Left    left, donated to his brother, EF 55-60%...   OSTECTOMY Right 10/20/2022   Procedure: CALCANEAL PARTIAL EXCISION, Flexor digitorum longus tendon transfer, right;  Surgeon: Kieth Brightly, DPM;  Location: [facility];  Service: Podiatry;  Laterality: Right;   POLYPECTOMY  09/06/2019   Procedure: POLYPECTOMY;  Surgeon: Malissa Hippo, MD;  [facility]: [facility];  Service: Endoscopy;;   SCROTAL EXPLORATION N/A 01/01/2021   Procedure: SCROTUM EXPLORATION- excision of scrotal sebacous cyst;  Surgeon: Malen Gauze, MD;  Location: [facility];  Service: Urology;  Laterality: N/A;   SCROTAL EXPLORATION N/A 02/08/2022   Procedure: SCROTUM EXPLORATION- excision of cyst;  Surgeon: Malen Gauze, MD;  Location: [facility];  Service: Urology;  Laterality: N/A;   SHOULDER ARTHROSCOPY WITH ROTATOR CUFF REPAIR AND SUBACROMIAL DECOMPRESSION Right 10/14/2017   Procedure: RIGHT SHOULDER ARTHROSCOPY WITH EXTENSIVE DEBRIDEMENT, SUBACROMIAL DECOMPRESSION, DISTAL CLAVICLE EXCISION AND BICEPS TENODYSIS;  Surgeon: Tarry Kos, MD;  Location: [facility];  Service: Orthopedics;  Laterality: Right;   TOTAL HIP ARTHROPLASTY Right 06/07/2012   Procedure: TOTAL HIP ARTHROPLASTY ANTERIOR APPROACH;  Surgeon: Eldred Manges, MD;  Location: [facility];  Service: Orthopedics;  Laterality: Right;  Right Total Hip Arthroplasty-Anterior Approach   TRANSTHORACIC ECHOCARDIOGRAM  04/28/2020   [facility]) EF 65 to 70%.  No or WMA.  GR 1 DD.  Mildly thickened aortic valve.  Mild to moderately dilated left atrium.  Normal RV size and function.  Mild RA dilation.:   ULNAR NERVE TRANSPOSITION Left 11/13/2020   Procedure: left elbow ulnar nerve neurolysis, ganglion cyst removal;  Surgeon: Tarry Kos, MD;  Location: [facility];  Service: Orthopedics;  Laterality: Left;   Social History   Occupational History   Occupation: Actuary: [employer]    Comment: Now called [employer]  Tobacco Use   Smoking status: Former    Current packs/day: 0.00    Average packs/day: 1 pack/day for 40.0 years (40.0 ttl pk-yrs)    Types: Cigarettes    Start date: 04/22/1958    Quit date: 04/22/1998    Years since quitting: 24.8   Smokeless tobacco: Never  Vaping Use   Vaping status: Never Used  Substance and Sexual Activity   Alcohol use: No   Drug use: No   Sexual activity: Yes

## 2023-05-30 ENCOUNTER — Other Ambulatory Visit: Payer: Self-pay | Admitting: *Deleted

## 2023-05-30 DIAGNOSIS — R7989 Other specified abnormal findings of blood chemistry: Secondary | ICD-10-CM

## 2023-05-30 DIAGNOSIS — K824 Cholesterolosis of gallbladder: Secondary | ICD-10-CM

## 2023-07-15 ENCOUNTER — Other Ambulatory Visit (INDEPENDENT_AMBULATORY_CARE_PROVIDER_SITE_OTHER): Payer: Self-pay

## 2023-07-15 ENCOUNTER — Ambulatory Visit: Admitting: Orthopedic Surgery

## 2023-07-15 VITALS — BP 152/71 | HR 66 | Ht 70.5 in | Wt 239.0 lb

## 2023-07-15 DIAGNOSIS — M545 Low back pain, unspecified: Secondary | ICD-10-CM

## 2023-07-15 MED ORDER — CYCLOBENZAPRINE HCL 10 MG PO TABS: 10.0000 mg | ORAL_TABLET | Freq: Three times a day (TID) | ORAL | 0 refills | Status: DC | PRN

## 2023-07-15 NOTE — Progress Notes (Signed)
 Orthopedic Spine Surgery Office Note  Assessment: Patient is a 68 y.o. male with acute onset lumbar back pain   Plan: -Explained that initially conservative treatment is tried as a significant number of patients may experience relief with these treatment modalities. Discussed that the conservative treatments include:  -activity modification  -physical therapy  -over the counter pain medications  -medrol  dosepak  -lumbar steroid injections -Patient has tried Tylenol , ibuprofen , heat, prednisone  - Prescribed Flexeril for additional pain relief -Patient has done over 6 weeks of conservative treatment including ibuprofen , Tylenol , prednisone  with his primary care doctor, so recommended an MRI of the lumbar spine to evaluate further -Patient should return to office in 4 weeks, x-rays at next visit: npne   Patient expressed understanding of the plan and all questions were answered to the patient's satisfaction.   ___________________________________________________________________________   History:  Patient is a 68 y.o. male who presents today for lumbar spine.  Patient has had about 8 weeks of low back pain that has gotten progressively worse with time.  He said is really gotten bad in the last month.  He describes the pain as severe.  He feels it in the lower lumbar spine.  He notes that around the belt line.  He does not have any pain radiating to either buttock or the lower extremities.  Denies paresthesia numbness.  There is no trauma or injury that preceded the onset of pain.   Weakness: Denies Symptoms of imbalance: Denies Paresthesias and numbness: Denies Bowel or bladder incontinence: Denies Saddle anesthesia: Denies  Treatments tried: Tylenol , ibuprofen , heat, prednisone   Review of systems: Denies fevers and chills, night sweats, unexplained weight loss, history of cancer.  Has had pain that wakes him at night  Past medical history: HTN GERD  Allergies: NKDA  Past  surgical history:  Right THA Right Achilles tendon repair Left kidney donor Hernia repair Left elbow surgery x 3 Right foot ostectomy Right shoulder rotator cuff repair  Social history: Denies use of nicotine product (smoking, vaping, patches, smokeless) Alcohol use: Denies Denies recreational drug use   Physical Exam:  BMI of 33.8  General: no acute distress, appears stated age Neurologic: alert, answering questions appropriately, following commands Respiratory: unlabored breathing on room air, symmetric chest rise Psychiatric: appropriate affect, normal cadence to speech   MSK (spine):  -Strength exam      Left  Right EHL    5/5  5/5 TA    5/5  5/5 GSC    5/5  5/5 Knee extension  5/5  5/5 Hip flexion   5/5  5/5  -Sensory exam    Sensation intact to light touch in L3-S1 nerve distributions of bilateral lower extremities  -Achilles DTR: 2/4 on the left, 2/4 on the right -Patellar tendon DTR: 2/4 on the left, 2/4 on the right  -Straight leg raise: negative bilaterally -Femoral nerve stretch test: negative bilaterally -Clonus: no beats bilaterally  -Left hip exam: No pain through range of motion, negative Stinchfield, negative FABER -Right hip exam: No pain through range of motion, negative Stinchfield, negative FABER  Imaging: XRs of the lumbar spine from 07/15/2023 were independently reviewed and interpreted, showing disc height loss at L4/5 and L5/S1.  Grade 1 spondylolisthesis seen at L5/S1.  No fracture or dislocation seen.    Patient name: Blake Solis Patient MRN: [id number] Date of visit: 07/15/23

## 2023-07-20 ENCOUNTER — Telehealth: Payer: Self-pay | Admitting: Orthopedic Surgery

## 2023-07-20 MED ORDER — DIAZEPAM 5 MG PO TABS: 5.0000 mg | ORAL_TABLET | Freq: Once | ORAL | 0 refills | Status: DC | PRN

## 2023-07-20 NOTE — Telephone Encounter (Signed)
 I called and advised his wife that Dr. Sulema Endo did send in valium  for him

## 2023-07-20 NOTE — Telephone Encounter (Signed)
 Patient called and wanted to know if he could get something before he do the MRI for his nerves. CB#[phone]

## 2023-07-20 NOTE — Addendum Note (Signed)
 Addended by: Colette Davies on: 07/20/2023 11:21 AM   Modules accepted: Orders

## 2023-07-23 ENCOUNTER — Ambulatory Visit (HOSPITAL_COMMUNITY)
Admission: RE | Admit: 2023-07-23 | Discharge: 2023-07-23 | Disposition: A | Discharge status: Home / Self Care | Source: Ambulatory Visit | Attending: Orthopedic Surgery | Admitting: Orthopedic Surgery

## 2023-07-23 DIAGNOSIS — M545 Low back pain, unspecified: Secondary | ICD-10-CM | POA: Diagnosis present

## 2023-07-24 ENCOUNTER — Ambulatory Visit (HOSPITAL_COMMUNITY): Admission: RE | Admit: 2023-07-24 | Source: Ambulatory Visit

## 2023-08-01 ENCOUNTER — Telehealth: Payer: Self-pay | Admitting: Orthopedic Surgery

## 2023-08-01 NOTE — Telephone Encounter (Signed)
 Patient called and ask if he could call to see if he can get the result sooner because his appt is tomorrow to go over the MRI results. CB#[phone]

## 2023-08-02 NOTE — Telephone Encounter (Signed)
Sent pt a message in MyChart

## 2023-08-03 ENCOUNTER — Ambulatory Visit: Admitting: Orthopedic Surgery

## 2023-08-03 DIAGNOSIS — M51369 Other intervertebral disc degeneration, lumbar region without mention of lumbar back pain or lower extremity pain: Secondary | ICD-10-CM

## 2023-08-03 NOTE — Progress Notes (Signed)
 Orthopedic Spine Surgery Office Note   Assessment: Patient is a 68 y.o. male with acute onset lumbar back pain.  He has facet arthritis at L4/5 and L5/S1 and a small disc herniation with annular tear at L4/5.  Given the acuity and the fact that his pain is worse with bending, I feel that his disc is more likely the etiology at this time     Plan: -Patient has tried Tylenol , ibuprofen , heat, prednisone , flexeril  -Told him his options at this point would be physical therapy or an injection.  He said that his insurance company does not cover physical therapy so he wanted to try the injection.  Referral was provided to him today -Patient should return to office on an as-needed basis     Patient expressed understanding of the plan and all questions were answered to the patient's satisfaction.    ___________________________________________________________________________     History:   Patient is a 68 y.o. male who presents today for follow-up on his lumbar spine.  Patient noticed acute onset of low back pain about 10 weeks ago.  He feels the pain in the lower lumbar region.  He says that the pain is worse with bending and gets better if he lays down.  He does not have any pain radiating into either lower extremity.  He has not noticed any change in his symptoms since he was last seen in the office.  He has not found the Flexeril  helpful.    Treatments tried: Tylenol , ibuprofen , heat, prednisone , flexeril      Physical Exam:   General: no acute distress, appears stated age Neurologic: alert, answering questions appropriately, following commands Respiratory: unlabored breathing on room air, symmetric chest rise Psychiatric: appropriate affect, normal cadence to speech     MSK (spine):   -Strength exam                                                   Left                  Right EHL                              5/5                  5/5 TA                                 5/5                   5/5 GSC                             5/5                  5/5 Knee extension            5/5                  5/5 Hip flexion                    5/5                  5/5   -  Sensory exam                           Sensation intact to light touch in L3-S1 nerve distributions of bilateral lower extremities   Imaging: XRs of the lumbar spine from 07/15/2023 were previously independently reviewed and interpreted, showing disc height loss at L4/5 and L5/S1.  Grade 1 spondylolisthesis seen at L5/S1.  No fracture or dislocation seen.   MRI of the lumbar spine from 07/23/2023 was independently reviewed and interpreted, showing right-sided foraminal stenosis at L4/5.  No other significant stenosis seen.  Facet arthropathy at L4/5 and L5/S1.  Small disc herniation with annular tear at L4/5.  DDD at L3/4 and L4/5.     Patient name: Blake Solis Patient MRN: [id number] Date of visit: 08/03/23

## 2023-08-05 ENCOUNTER — Telehealth: Admitting: Physician Assistant

## 2023-08-05 ENCOUNTER — Encounter

## 2023-08-05 DIAGNOSIS — J069 Acute upper respiratory infection, unspecified: Secondary | ICD-10-CM

## 2023-08-05 DIAGNOSIS — U071 COVID-19: Secondary | ICD-10-CM

## 2023-08-05 MED ORDER — PSEUDOEPH-BROMPHEN-DM 30-2-10 MG/5ML PO SYRP
5.0000 mL | ORAL_SOLUTION | Freq: Four times a day (QID) | ORAL | 0 refills | Status: DC | PRN
Start: 2023-08-05 — End: 2023-11-14

## 2023-08-05 MED ORDER — NIRMATRELVIR/RITONAVIR (PAXLOVID) TABLET (RENAL DOSING): 2.0000 | ORAL_TABLET | Freq: Two times a day (BID) | ORAL | 0 refills | Status: AC

## 2023-08-05 NOTE — Progress Notes (Signed)
 Virtual Visit Consent   Blake Solis, you are scheduled for a virtual visit with a [facility] provider today. Just as with appointments in the office, your consent must be obtained to participate. Your consent will be active for this visit and any virtual visit you may have with one of our providers in the next 365 days. If you have a MyChart account, a copy of this consent can be sent to you electronically.  As this is a virtual visit, video technology does not allow for your provider to perform a traditional examination. This may limit your provider's ability to fully assess your condition. If your provider identifies any concerns that need to be evaluated in person or the need to arrange testing (such as labs, EKG, etc.), we will make arrangements to do so. Although advances in technology are sophisticated, we cannot ensure that it will always work on either your end or our end. If the connection with a video visit is poor, the visit may have to be switched to a telephone visit. With either a video or telephone visit, we are not always able to ensure that we have a secure connection.  By engaging in this virtual visit, you consent to the provision of healthcare and authorize for your insurance to be billed (if applicable) for the services provided during this visit. Depending on your insurance coverage, you may receive a charge related to this service.  I need to obtain your verbal consent now. Are you willing to proceed with your visit today? Blake Solis has provided verbal consent on 08/05/2023 for a virtual visit (video or telephone). Char Common Ward, PA-C  Date: 08/05/2023 6:08 PM   Virtual Visit via Video Note   I, Char Common Ward, connected with  Blake Solis  ([id number], 18-Apr-1955) on 08/05/23 at  6:00 PM EDT by a video-enabled telemedicine application and verified that I am speaking with the correct person using two identifiers.  Location: Patient: Virtual Visit Location Patient:  Home Provider: Virtual Visit Location Provider: Home Office   I discussed the limitations of evaluation and management by telemedicine and the availability of in person appointments. The patient expressed understanding and agreed to proceed.    History of Present Illness: Blake Solis is a 68 y.o. who identifies as a male who was assigned male at birth, and is being seen today for congestion, sore throat, headache, body aches that started three days ago. Took a home test for COVID which was positive.  He reports some intermittent wheezing that he reports is worse when lying flat at night.  Reports subjective fever. He has taken theraflu and tylenol  with some improvement. Denies lower extremity swelling. He reports he has an albuterol inhaler that was prescribed last year for and URI, reports he hasn't needed yet.  Reports he has one   HPI: HPI  Problems:  Patient Active Problem List   Diagnosis Date Noted   Pain in left hip 02/23/2023   Symptomatic bradycardia 12/04/2021   Scrotal sebaceous cyst 12/24/2020   Ganglion of left elbow 10/28/2020   Chest pain with high risk for cardiac etiology 04/29/2020   Renal cyst 01/21/2020   Benign prostatic hyperplasia with urinary obstruction 01/21/2020   Nocturia 01/21/2020   S/P arthroscopy of right shoulder 11/01/2017   Superior glenoid labrum lesion of right shoulder    Arthrosis of right acromioclavicular joint    Tendinopathy of rotator cuff, right    Impingement syndrome of right shoulder  Nontraumatic tear of right supraspinatus tendon 09/29/2017   Cubital tunnel syndrome on left 05/13/2017   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) 06/07/2012    Class: Diagnosis of   Essential hypertension 07/08/2010   Hyperlipidemia due to dietary fat intake 07/08/2010   Prostatitis 07/08/2010   Degenerative disc disease 07/08/2010   Hydrocele of testis 07/08/2010   Obesity (BMI 35.0-39.9 without comorbidity) 09/08/2009    Allergies: No  Known Allergies Medications:  Current Outpatient Medications:    brompheniramine-pseudoephedrine-DM 30-2-10 MG/5ML syrup, Take 5 mLs by mouth 4 (four) times daily as needed., Disp: 120 mL, Rfl: 0   nirmatrelvir/ritonavir, renal dosing, (PAXLOVID) 10 x 150 MG & 10 x 100MG  TABS, Take 2 tablets by mouth 2 (two) times daily for 5 days. (Take nirmatrelvir 150 mg one tablet twice daily for 5 days and ritonavir 100 mg one tablet twice daily for 5 days), Disp: 20 tablet, Rfl: 0   amLODipine  (NORVASC ) 10 MG tablet, Take 10 mg by mouth in the morning., Disp: , Rfl:    cyclobenzaprine  (FLEXERIL ) 10 MG tablet, Take 1 tablet (10 mg total) by mouth 3 (three) times daily as needed (pain, muscle spasms)., Disp: 50 tablet, Rfl: 0   diazepam  (VALIUM ) 5 MG tablet, Take 1 tablet (5 mg total) by mouth once as needed for up to 1 dose (prior to MRI)., Disp: 1 tablet, Rfl: 0   losartan (COZAAR) 100 MG tablet, Take 100 mg by mouth in the morning., Disp: , Rfl:   Observations/Objective: Patient is well-developed, well-nourished in no acute distress.  Resting comfortably at home.  Head is normocephalic, atraumatic.  No labored breathing.  Speech is clear and coherent with logical content.  Patient is alert and oriented at baseline.    Assessment and Plan: 1. COVID (Primary)  2. Viral upper respiratory tract infection  Pt appears to be in no acute distress, speaking in complete sentences.   Will send in paxlovid and cough syrup.  Supportive care discussed.  In person evaluation precautions given.   Follow Up Instructions: I discussed the assessment and treatment plan with the patient. The patient was provided an opportunity to ask questions and all were answered. The patient agreed with the plan and demonstrated an understanding of the instructions.  A copy of instructions were sent to the patient via MyChart unless otherwise noted below.     The patient was advised to call back or seek an in-person evaluation  if the symptoms worsen or if the condition fails to improve as anticipated.    Char Common Ward, PA-C

## 2023-08-05 NOTE — Patient Instructions (Signed)
 Blake Solis, thank you for joining Char Common Ward, PA-C for today's virtual visit.  While this provider is not your primary care provider (PCP), if your PCP is located in our provider database this encounter information will be shared with them immediately following your visit.   A [facility] MyChart account gives you access to today's visit and all your visits, tests, and labs performed at [facility] " click here if you don't have a [facility] MyChart account or go to [email or web address]  Consent: (Patient) Blake Solis provided verbal consent for this virtual visit at the beginning of the encounter.  Current Medications:  Current Outpatient Medications:    brompheniramine-pseudoephedrine-DM 30-2-10 MG/5ML syrup, Take 5 mLs by mouth 4 (four) times daily as needed., Disp: 120 mL, Rfl: 0   nirmatrelvir/ritonavir, renal dosing, (PAXLOVID) 10 x 150 MG & 10 x 100MG  TABS, Take 2 tablets by mouth 2 (two) times daily for 5 days. (Take nirmatrelvir 150 mg one tablet twice daily for 5 days and ritonavir 100 mg one tablet twice daily for 5 days), Disp: 20 tablet, Rfl: 0   amLODipine  (NORVASC ) 10 MG tablet, Take 10 mg by mouth in the morning., Disp: , Rfl:    cyclobenzaprine  (FLEXERIL ) 10 MG tablet, Take 1 tablet (10 mg total) by mouth 3 (three) times daily as needed (pain, muscle spasms)., Disp: 50 tablet, Rfl: 0   diazepam  (VALIUM ) 5 MG tablet, Take 1 tablet (5 mg total) by mouth once as needed for up to 1 dose (prior to MRI)., Disp: 1 tablet, Rfl: 0   losartan (COZAAR) 100 MG tablet, Take 100 mg by mouth in the morning., Disp: , Rfl:    Medications ordered in this encounter:  Meds ordered this encounter  Medications   nirmatrelvir/ritonavir, renal dosing, (PAXLOVID) 10 x 150 MG & 10 x 100MG  TABS    Sig: Take 2 tablets by mouth 2 (two) times daily for 5 days. (Take nirmatrelvir 150 mg one tablet twice daily for 5 days and ritonavir 100 mg one tablet twice daily for 5 days)     Dispense:  20 tablet    Refill:  0    Supervising Provider:   Corine Dice [[id number]]   brompheniramine-pseudoephedrine-DM 30-2-10 MG/5ML syrup    Sig: Take 5 mLs by mouth 4 (four) times daily as needed.    Dispense:  120 mL    Refill:  0    Supervising Provider:   Corine Dice [[id number]]     *If you need refills on other medications prior to your next appointment, please contact your pharmacy*  Follow-Up: Call back or seek an in-person evaluation if the symptoms worsen or if the condition fails to improve as anticipated.  [facility] [phone]  Other Instructions Take Paxlovid as prescribed.  Can take cough syrup as needed for cough.  Recommend Mucinex for congestion.  If symptoms become worse or no improvement recommend in person evaluation.    If you have been instructed to have an in-person evaluation today at a local Urgent Care facility, please use the link below. It will take you to a list of all of our available [facility], including address, phone number and hours of operation. Please do not delay care.  [facility]  If you or a family member do not have a primary care provider, use the link below to schedule a visit and establish care. When you choose a  primary care physician  or advanced practice provider, you gain a long-term partner in health. Find a Primary Care Provider  Learn more about [facility]'s in-office and virtual care options: [facility] - Get Care Now

## 2023-08-12 ENCOUNTER — Telehealth: Payer: Self-pay | Admitting: Orthopedic Surgery

## 2023-08-12 ENCOUNTER — Ambulatory Visit (INDEPENDENT_AMBULATORY_CARE_PROVIDER_SITE_OTHER): Admitting: Urology

## 2023-08-12 ENCOUNTER — Encounter: Payer: Self-pay | Admitting: Urology

## 2023-08-12 VITALS — BP 127/74 | HR 51

## 2023-08-12 DIAGNOSIS — N401 Enlarged prostate with lower urinary tract symptoms: Secondary | ICD-10-CM | POA: Diagnosis not present

## 2023-08-12 DIAGNOSIS — Z872 Personal history of diseases of the skin and subcutaneous tissue: Secondary | ICD-10-CM

## 2023-08-12 DIAGNOSIS — R351 Nocturia: Secondary | ICD-10-CM

## 2023-08-12 DIAGNOSIS — L729 Follicular cyst of the skin and subcutaneous tissue, unspecified: Secondary | ICD-10-CM

## 2023-08-12 DIAGNOSIS — N138 Other obstructive and reflux uropathy: Secondary | ICD-10-CM

## 2023-08-12 LAB — URINALYSIS, ROUTINE W REFLEX MICROSCOPIC
Bilirubin, UA: NEGATIVE
Glucose, UA: NEGATIVE
Ketones, UA: NEGATIVE
Leukocytes,UA: NEGATIVE
Nitrite, UA: NEGATIVE
Protein,UA: NEGATIVE
RBC, UA: NEGATIVE
Specific Gravity, UA: 1.02 (ref 1.005–1.030)
Urobilinogen, Ur: 1 mg/dL (ref 0.2–1.0)
pH, UA: 6 (ref 5.0–7.5)

## 2023-08-12 NOTE — Progress Notes (Signed)
 08/12/2023 10:02 AM   Blake Solis December 29, 1955 [id number]  Referring provider: Lauran Pollard, MD [address] [place],  [place] [address]  Followup nocturia   HPI: Blake Solis is a 68yo here for followup for scrotal cyst and BPH and nocturia. IPSS 5 QOL 1 on no BPh therapy. Nocturia 2x depending on fluid consumption. Urine stream strong. No straining to urinate. Scrotal cyst has resolved.   PMH: Past Medical History:  Diagnosis Date   Arthritis    Bronchitis, allergic    Essential hypertension    On several medications.   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   Wears glasses     Surgical History: Past Surgical History:  Procedure Laterality Date   ACHILLES TENDON SURGERY Right 10/20/2022   Procedure: ACHILLES TENDON REPAIR;  Surgeon: Ellard Gunning, DPM;  Location: [facility];  Service: Podiatry;  Laterality: Right;   CARDIAC CATHETERIZATION  04/04/2002   Images reviewed: Normal coronaries   COLONOSCOPY N/A 09/06/2019   Procedure: COLONOSCOPY;  Surgeon: Ruby Corporal, MD;  [facility];  Service: Endoscopy;  Laterality: N/A;  135   CYST EXCISION Right    cyst removed from arm   ELBOW SURGERY Left    HIP ARTHROPLASTY Right    KIDNEY DONATION Left    Was donor for his brother   KNEE ARTHROSCOPY Right 2013   lft ulnar nerve removed     decompression   MASS EXCISION Left 09/14/2021   Procedure: EXCISION SOFT TISSUE MASS LEFT FOOT;  Surgeon: Ellard Gunning, DPM;  Location: [facility];  Service: Podiatry;  Laterality: Left;  MAC WITH LOCAL   NEPHRECTOMY Left    left, donated to his brother, EF 55-60%...   OSTECTOMY Right 10/20/2022   Procedure: CALCANEAL PARTIAL EXCISION, Flexor digitorum  longus tendon transfer, right;  Surgeon: Ellard Gunning, DPM;  Location: [facility];  Service: Podiatry;  Laterality: Right;   POLYPECTOMY  09/06/2019   Procedure: POLYPECTOMY;  Surgeon: Ruby Corporal, MD;  [facility];  Service: Endoscopy;;   SCROTAL EXPLORATION N/A 01/01/2021   Procedure: SCROTUM EXPLORATION- excision of scrotal sebacous cyst;  Surgeon: Marco Severs, MD;  Location: [facility];  Service: Urology;  Laterality: N/A;   SCROTAL EXPLORATION N/A 02/08/2022   Procedure: SCROTUM EXPLORATION- excision of cyst;  Surgeon: Marco Severs, MD;  Location: [facility];  Service: Urology;  Laterality: N/A;   SHOULDER ARTHROSCOPY WITH ROTATOR CUFF REPAIR AND SUBACROMIAL DECOMPRESSION Right 10/14/2017   Procedure: RIGHT SHOULDER ARTHROSCOPY WITH EXTENSIVE DEBRIDEMENT, SUBACROMIAL DECOMPRESSION, DISTAL CLAVICLE EXCISION AND BICEPS TENODYSIS;  Surgeon: Wes Hamman, MD;  Location: [facility];  Service: Orthopedics;  Laterality: Right;   TOTAL HIP ARTHROPLASTY Right 06/07/2012   Procedure: TOTAL HIP ARTHROPLASTY ANTERIOR APPROACH;  Surgeon: Adah Acron, MD;  Location: [facility];  Service: Orthopedics;  Laterality: Right;  Right Total Hip Arthroplasty-Anterior Approach   TRANSTHORACIC ECHOCARDIOGRAM  04/28/2020   [facility]) EF 65 to 70%.  No or WMA.  GR 1 DD.  Mildly thickened aortic valve.  Mild to moderately dilated left atrium.  Normal RV size and function.  Mild RA dilation.:   ULNAR NERVE TRANSPOSITION Left 11/13/2020  Procedure: left elbow ulnar nerve neurolysis, ganglion cyst removal;  Surgeon: Wes Hamman, MD;  Location: [facility];  Service: Orthopedics;  Laterality: Left;    Home Medications:  Allergies as of 08/12/2023   No Known Allergies      Medication List        Accurate as of Aug 12, 2023 10:02 AM. If you have any questions, ask your nurse or doctor.          STOP taking these medications     cyclobenzaprine  10 MG tablet Commonly known as: FLEXERIL  Stopped by: Johnie Nailer       TAKE these medications    amLODipine  10 MG tablet Commonly known as: NORVASC  Take 10 mg by mouth in the morning.   brompheniramine-pseudoephedrine-DM 30-2-10 MG/5ML syrup Take 5 mLs by mouth 4 (four) times daily as needed.   diazepam  5 MG tablet Commonly known as: VALIUM  Take 1 tablet (5 mg total) by mouth once as needed for up to 1 dose (prior to MRI).   losartan 100 MG tablet Commonly known as: COZAAR Take 100 mg by mouth in the morning.        Allergies: No Known Allergies  Family History: Family History  Problem Relation Age of Onset   Colon cancer Brother 7   Neurologic Disorder Mother        Theron Flavin - by report   Liver disease Father    Other Sister 110       Natural causes   Pancreatic cancer Brother 64       Unknown   Diabetes Mellitus II Brother        Died from DM-Coma @ 2   Diabetic kidney disease Brother 44       End-stage- s/p Txplant ([facility] was the Donor)   Lung cancer Brother 67   Other Sister 21   Healthy Sister        He does not know much details   Arthritis Sister    Diabetes type II Sister    Arthritis Sister    Stomach cancer Sister     Social History:  reports that he quit smoking about 25 years ago. His smoking use included cigarettes. He started smoking about 65 years ago. He has a 40 pack-year smoking history. He has never used smokeless tobacco. He reports that he does not drink alcohol and does not use drugs.  ROS: All other review of systems were reviewed and are negative except what is noted above in HPI  Physical Exam: BP 127/74   Pulse (!) 51   Constitutional:  Alert and oriented, No acute distress. HEENT: [place] AT, moist mucus membranes.  Trachea midline, no masses. Cardiovascular: No clubbing, cyanosis, or edema. Respiratory: Normal respiratory effort, no increased work of breathing. GI: Abdomen is soft, nontender,  nondistended, no abdominal masses GU: No CVA tenderness.  Lymph: No cervical or inguinal lymphadenopathy. Skin: No rashes, bruises or suspicious lesions. Neurologic: Grossly intact, no focal deficits, moving all 4 extremities. Psychiatric: Normal mood and affect.  Laboratory Data: Lab Results  Component Value Date   WBC 6.2 09/02/2016   HGB 14.6 10/20/2022   HCT 43.0 10/20/2022   MCV 88.9 09/02/2016   PLT 136 (L) 09/02/2016    Lab Results  Component Value Date   CREATININE 1.20 10/20/2022    No results found for: "PSA"  No results found for: "TESTOSTERONE"  No results found for: "HGBA1C"  Urinalysis    Component Value Date/Time  COLORURINE YELLOW 06/02/2012 1326   APPEARANCEUR Clear 01/22/2022 0909   LABSPEC 1.015 06/02/2012 1326   PHURINE 8.0 06/02/2012 1326   GLUCOSEU Negative 01/22/2022 0909   HGBUR NEGATIVE 06/02/2012 1326   BILIRUBINUR Negative 01/22/2022 0909   KETONESUR NEGATIVE 06/02/2012 1326   PROTEINUR Negative 01/22/2022 0909   PROTEINUR NEGATIVE 06/02/2012 1326   UROBILINOGEN 0.2 12/21/2021 0940   UROBILINOGEN 1.0 06/02/2012 1326   NITRITE Negative 01/22/2022 0909   NITRITE NEGATIVE 06/02/2012 1326   LEUKOCYTESUR Negative 01/22/2022 0909    Lab Results  Component Value Date   LABMICR Comment 01/22/2022   WBCUA None seen 12/23/2020   LABEPIT None seen 12/23/2020   BACTERIA None seen 12/23/2020    Pertinent Imaging:  No results found for this or any previous visit.  No results found for this or any previous visit.  No results found for this or any previous visit.  No results found for this or any previous visit.  No results found for this or any previous visit.  No results found for this or any previous visit.  No results found for this or any previous visit.  No results found for this or any previous visit.   Assessment & Plan:    1. Scrotal cyst (Primary) -resolved  - Urinalysis, Routine w reflex microscopic  2. Benign  prostatic hyperplasia with urinary obstruction -patient defers therapy at this time  3. Nocturia Decrease fluid intake within 2 hours of going to bed.   No follow-ups on file.  Johnie Nailer, MD  [facility] [place]

## 2023-08-12 NOTE — Patient Instructions (Signed)

## 2023-08-12 NOTE — Telephone Encounter (Signed)
 Patient called and said he has question about the shot he is getting on Wednesday. CB#[phone]

## 2023-08-17 ENCOUNTER — Other Ambulatory Visit: Payer: Self-pay

## 2023-08-17 ENCOUNTER — Ambulatory Visit: Admitting: Physical Medicine and Rehabilitation

## 2023-08-17 VITALS — BP 165/93 | HR 55

## 2023-08-17 DIAGNOSIS — M5416 Radiculopathy, lumbar region: Secondary | ICD-10-CM | POA: Diagnosis not present

## 2023-08-17 NOTE — Progress Notes (Signed)
 Pain Scale   Average Pain 3 Patient advising his pain is constant without relief, however the pain is not daily.        +Driver, -BT, -Dye Allergies.

## 2023-08-17 NOTE — Patient Instructions (Signed)

## 2023-08-18 ENCOUNTER — Ambulatory Visit: Admitting: Orthopedic Surgery

## 2023-08-20 NOTE — Procedures (Signed)
 Lumbar Epidural Steroid Injection - Interlaminar Approach with Fluoroscopic Guidance  Patient: Blake Solis      Date of Birth: [birth year] MRN: [id number] PCP: [facility]      Visit Date: 08/17/2023   Universal Protocol:     Consent Given By: the patient  Position: PRONE  Additional Comments: Vital signs were monitored before and after the procedure. Patient was prepped and draped in the usual sterile fashion. The correct patient, procedure, and site was verified.   Injection Procedure Details:   Procedure diagnoses: Lumbar radiculopathy [M54.16]   Meds Administered: No orders of the defined types were placed in this encounter.    Laterality: Right  Location/Site:  L4-5  Needle: 3.5 in., 20 ga. Tuohy  Needle Placement: Paramedian epidural  Findings:   -Comments: Excellent flow of contrast into the epidural space.  Procedure Details: Using a paramedian approach from the side mentioned above, the region overlying the inferior lamina was localized under fluoroscopic visualization and the soft tissues overlying this structure were infiltrated with 4 ml. of 1% Lidocaine  without Epinephrine . The Tuohy needle was inserted into the epidural space using a paramedian approach.   The epidural space was localized using loss of resistance along with counter oblique bi-planar fluoroscopic views.  After negative aspirate for air, blood, and CSF, a 2 ml. volume of Isovue-250 was injected into the epidural space and the flow of contrast was observed. Radiographs were obtained for documentation purposes.    The injectate was administered into the level noted above.   Additional Comments:  The patient tolerated the procedure well Dressing: 2 x 2 sterile gauze and Band-Aid    Post-procedure details: Patient was observed during the procedure. Post-procedure instructions were reviewed.  Patient left the clinic in stable condition.

## 2023-08-20 NOTE — Progress Notes (Signed)
 Blake Solis - 68 y.o. male MRN [id number]  Date of birth: 12-10-1955  Office Visit Note: Visit Date: 08/17/2023 PCP: [facility] Referred by: Diedra Fowler, MD  Subjective: Chief Complaint  Patient presents with   Lower Back - Pain   HPI:  Blake Solis is a 68 y.o. male who comes in today at the request of Dr. Colette Davies for planned Right L4-5 Lumbar Interlaminar epidural steroid injection with fluoroscopic guidance.  The patient has failed conservative care including home exercise, medications, time and activity modification.  This injection will be diagnostic and hopefully therapeutic.  Please see requesting physician notes for further details and justification.   ROS Otherwise per HPI.  Assessment & Plan: Visit Diagnoses:    ICD-10-CM   1. Lumbar radiculopathy  M54.16 XR C-ARM NO REPORT    Epidural Steroid injection      Plan: No additional findings.   Meds & Orders: No orders of the defined types were placed in this encounter.   Orders Placed This Encounter  Procedures   XR C-ARM NO REPORT   Epidural Steroid injection    Follow-up: Return for visit to requesting provider as needed.   Procedures: No procedures performed  Lumbar Epidural Steroid Injection - Interlaminar Approach with Fluoroscopic Guidance  Patient: Blake Solis      Date of Birth: 1955/04/21 MRN: [id number] PCP: [facility]      Visit Date: 08/17/2023   Universal Protocol:     Consent Given By: the patient  Position: PRONE  Additional Comments: Vital signs were monitored before and after the procedure. Patient was prepped and draped in the usual sterile fashion. The correct patient, procedure, and site was verified.   Injection Procedure Details:   Procedure diagnoses: Lumbar radiculopathy [M54.16]   Meds Administered: No orders of the defined types were placed in this encounter.    Laterality: Right  Location/Site:  L4-5  Needle: 3.5 in.,  20 ga. Tuohy  Needle Placement: Paramedian epidural  Findings:   -Comments: Excellent flow of contrast into the epidural space.  Procedure Details: Using a paramedian approach from the side mentioned above, the region overlying the inferior lamina was localized under fluoroscopic visualization and the soft tissues overlying this structure were infiltrated with 4 ml. of 1% Lidocaine  without Epinephrine . The Tuohy needle was inserted into the epidural space using a paramedian approach.   The epidural space was localized using loss of resistance along with counter oblique bi-planar fluoroscopic views.  After negative aspirate for air, blood, and CSF, a 2 ml. volume of Isovue-250 was injected into the epidural space and the flow of contrast was observed. Radiographs were obtained for documentation purposes.    The injectate was administered into the level noted above.   Additional Comments:  The patient tolerated the procedure well Dressing: 2 x 2 sterile gauze and Band-Aid    Post-procedure details: Patient was observed during the procedure. Post-procedure instructions were reviewed.  Patient left the clinic in stable condition.   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  No other significant stenosis seen.  Facet arthropathy at L4/5 and L5/S1.  Small disc herniation with annular tear at L4/5.  DDD at L3/4 and L4/5.     Objective:  VS:  HT:    WT:   BMI:     BP:(!) 165/93  HR:(!) 55bpm  TEMP: ( )  RESP:  Physical  Exam Vitals and nursing note reviewed.  Constitutional:      General: He is not in acute distress.    Appearance: Normal appearance. He is obese. He is not ill-appearing.  HENT:     Head: Normocephalic and atraumatic.     Right Ear: External ear normal.     Left Ear: External ear normal.     Nose: No congestion.  Eyes:     Extraocular Movements:  Extraocular movements intact.  Cardiovascular:     Rate and Rhythm: Normal rate.     Pulses: Normal pulses.  Pulmonary:     Effort: Pulmonary effort is normal. No respiratory distress.  Abdominal:     General: There is no distension.     Palpations: Abdomen is soft.  Musculoskeletal:        General: No tenderness or signs of injury.     Cervical back: Neck supple.     Right lower leg: No edema.     Left lower leg: No edema.     Comments: Patient has good distal strength without clonus.  Skin:    Findings: No erythema or rash.  Neurological:     General: No focal deficit present.     Mental Status: He is alert and oriented to person, place, and time.     Sensory: No sensory deficit.     Motor: No weakness or abnormal muscle tone.     Coordination: Coordination normal.  Psychiatric:        Mood and Affect: Mood normal.        Behavior: Behavior normal.      Imaging: No results found.

## 2023-08-22 ENCOUNTER — Encounter: Payer: Self-pay | Admitting: Cardiology

## 2023-08-30 ENCOUNTER — Telehealth: Payer: Self-pay | Admitting: Orthopedic Surgery

## 2023-08-30 NOTE — Telephone Encounter (Signed)
 Patient called just to let you know that the shot didn't work for him. CB#[phone]

## 2023-09-01 NOTE — Telephone Encounter (Signed)
 Sent mychart message

## 2023-09-08 ENCOUNTER — Telehealth: Payer: Self-pay | Admitting: Orthopedic Surgery

## 2023-09-08 DIAGNOSIS — M47816 Spondylosis without myelopathy or radiculopathy, lumbar region: Secondary | ICD-10-CM

## 2023-09-08 NOTE — Telephone Encounter (Signed)
 Patient called and said that he was supposed to let [facility] know how he is doing with the back injection and he is still in pain. CB#[phone]

## 2023-09-19 NOTE — Telephone Encounter (Signed)
 I called advised patient that the referral was placed for [facility], due to Dr. Eldonna, being booked out, also that this injection was different from the previous. He states that he understands this.

## 2023-09-19 NOTE — Addendum Note (Signed)
 Addended by: GEORGINA SHARPER on: 09/19/2023 08:43 AM   Modules accepted: Orders

## 2023-09-27 ENCOUNTER — Other Ambulatory Visit: Payer: Self-pay | Admitting: Orthopedic Surgery

## 2023-09-27 DIAGNOSIS — M47816 Spondylosis without myelopathy or radiculopathy, lumbar region: Secondary | ICD-10-CM

## 2023-09-27 NOTE — Discharge Instructions (Signed)

## 2023-09-28 ENCOUNTER — Inpatient Hospital Stay
Admission: RE | Admit: 2023-09-28 | Discharge: 2023-09-28 | Disposition: A | Discharge status: Home / Self Care | Source: Ambulatory Visit | Attending: Orthopedic Surgery

## 2023-09-28 ENCOUNTER — Ambulatory Visit
Admission: RE | Admit: 2023-09-28 | Discharge: 2023-09-28 | Disposition: A | Discharge status: Home / Self Care | Source: Ambulatory Visit | Attending: Orthopedic Surgery | Admitting: Orthopedic Surgery

## 2023-09-28 DIAGNOSIS — M47816 Spondylosis without myelopathy or radiculopathy, lumbar region: Secondary | ICD-10-CM

## 2023-09-28 MED ORDER — METHYLPREDNISOLONE ACETATE 40 MG/ML INJ SUSP (RADIOLOG
80.0000 mg | Freq: Once | INTRAMUSCULAR | Status: AC
  Administered 2023-09-28: 80 mg via INTRA_ARTICULAR

## 2023-09-28 MED ORDER — IOPAMIDOL (ISOVUE-M 200) INJECTION 41%
1.0000 mL | Freq: Once | INTRAMUSCULAR | Status: AC
  Administered 2023-09-28: 1 mL via INTRA_ARTICULAR

## 2023-10-13 ENCOUNTER — Other Ambulatory Visit

## 2023-10-13 NOTE — Discharge Instructions (Signed)

## 2023-10-13 NOTE — Discharge Instructions (Signed)

## 2023-10-14 ENCOUNTER — Ambulatory Visit
Admission: RE | Admit: 2023-10-14 | Discharge: 2023-10-14 | Disposition: A | Discharge status: Home / Self Care | Source: Ambulatory Visit | Attending: Orthopedic Surgery | Admitting: Orthopedic Surgery

## 2023-10-14 DIAGNOSIS — M47816 Spondylosis without myelopathy or radiculopathy, lumbar region: Secondary | ICD-10-CM

## 2023-10-14 MED ORDER — METHYLPREDNISOLONE ACETATE 40 MG/ML INJ SUSP (RADIOLOG
80.0000 mg | Freq: Once | INTRAMUSCULAR | Status: AC
  Administered 2023-10-14: 80 mg via EPIDURAL

## 2023-10-14 MED ORDER — IOPAMIDOL (ISOVUE-M 200) INJECTION 41%
1.0000 mL | Freq: Once | INTRAMUSCULAR | Status: AC
  Administered 2023-10-14: 1 mL via EPIDURAL

## 2023-11-11 ENCOUNTER — Telehealth: Payer: Self-pay | Admitting: Urology

## 2023-11-11 NOTE — Telephone Encounter (Signed)
 Had CT done by PCP and they said he needs to see urology for a mass on kidney

## 2023-11-14 ENCOUNTER — Other Ambulatory Visit: Payer: Self-pay | Admitting: *Deleted

## 2023-11-14 ENCOUNTER — Ambulatory Visit (INDEPENDENT_AMBULATORY_CARE_PROVIDER_SITE_OTHER): Admitting: Gastroenterology

## 2023-11-14 ENCOUNTER — Telehealth: Payer: Self-pay | Admitting: Gastroenterology

## 2023-11-14 ENCOUNTER — Encounter: Payer: Self-pay | Admitting: *Deleted

## 2023-11-14 ENCOUNTER — Encounter: Payer: Self-pay | Admitting: Gastroenterology

## 2023-11-14 VITALS — BP 126/72 | HR 51 | Temp 98.6°F | Ht 70.0 in | Wt 232.2 lb

## 2023-11-14 DIAGNOSIS — R911 Solitary pulmonary nodule: Secondary | ICD-10-CM | POA: Diagnosis not present

## 2023-11-14 DIAGNOSIS — Z860101 Personal history of adenomatous and serrated colon polyps: Secondary | ICD-10-CM

## 2023-11-14 DIAGNOSIS — K5900 Constipation, unspecified: Secondary | ICD-10-CM

## 2023-11-14 DIAGNOSIS — R933 Abnormal findings on diagnostic imaging of other parts of digestive tract: Secondary | ICD-10-CM

## 2023-11-14 DIAGNOSIS — R634 Abnormal weight loss: Secondary | ICD-10-CM | POA: Diagnosis not present

## 2023-11-14 DIAGNOSIS — K219 Gastro-esophageal reflux disease without esophagitis: Secondary | ICD-10-CM

## 2023-11-14 DIAGNOSIS — Z800 Family history of malignant neoplasm of digestive organs: Secondary | ICD-10-CM

## 2023-11-14 DIAGNOSIS — K5909 Other constipation: Secondary | ICD-10-CM | POA: Diagnosis not present

## 2023-11-14 DIAGNOSIS — R5383 Other fatigue: Secondary | ICD-10-CM

## 2023-11-14 MED ORDER — PEG 3350-KCL-NA BICARB-NACL 420 G PO SOLR
4000.0000 mL | Freq: Once | ORAL | 0 refills | Status: AC
Start: 2023-11-14 — End: 2023-11-14

## 2023-11-14 NOTE — Telephone Encounter (Signed)
 Please request most recent labs from Dr. Trudy office, [facility] is off until Wednesday.   Charmaine Melia, MSN, APRN, FNP-BC, AGACNP-BC [facility]

## 2023-11-14 NOTE — Patient Instructions (Addendum)
 VISIT SUMMARY:  Today, you were seen for unexplained weight loss and constipation. We discussed your recent weight loss, constipation issues, and reviewed your medical history, including your kidney stone, mild emphysema, and occasional reflux symptoms. We also talked about your family history of cancer and recent findings from your CT scan.  YOUR PLAN: -UNINTENTIONAL WEIGHT LOSS AND FATIGUE: You have lost weight from 254 lbs to 232 lbs over the past few months without trying, and you feel more tired than usual. This could be due to several reasons, including possible issues with your esophagus or stomach. We will request recent lab results from Dr. Trudy, schedule a colonoscopy and upper endoscopy to check for any serious conditions, and monitor your weight over the next 1-2 months. Please follow up on the lung nodule evaluation as recommended by your primary care.   -CHRONIC CONSTIPATION WITH STRAINING: You have been experiencing constipation with hard stools and straining every 2-3 days. We recommend you use Metamucil or psyllium tablets regularly to help with fiber intake. We will also provide you with Dulcolax to take before and during your colonoscopy prep to ensure your bowels are clear.  -ESOPHAGOGASTRIC JUNCTION WALL THICKENING, UNDER EVALUATION: A recent CT scan showed thickening at the junction between your esophagus and stomach, which could be due to inflammation or a more serious condition. We will perform an upper endoscopy to get a closer look and rule out any serious issues.  -LUNG NODULE, UNDER EVALUATION: A nodule was found in your lung on a recent CT scan. This could be benign or something more serious. Follow up as recommended by primary care.    INSTRUCTIONS: Please follow up with Dr. Trudy for your recent lab results, and schedule your colonoscopy and upper endoscopy as soon as possible. Monitor your weight over the next 1-2 months and report any further weight loss. Follow  up with your lung specialist for the evaluation of the lung nodule. Continue using Metamucil or psyllium tablets regularly for constipation, and take Dulcolax as directed before and during your colonoscopy prep. We will follow up in 1-2 months after your procedure pending findings.   It was a pleasure to see you today. I want to create trusting relationships with patients. If you receive a survey regarding your visit,  I greatly appreciate you taking time to fill this out on paper or through your MyChart. I value your feedback.  Charmaine Melia, MSN, FNP-BC, AGACNP-BC [facility]

## 2023-11-14 NOTE — Progress Notes (Signed)
 GI Office Note    Referring Provider: [facility] Primary Care Physician:  [facility] Gastroenterologist: Lamar HERO.Rourk, MD  Date:  11/14/2023  ID:  Blake Solis, DOB 18-Aug-1955, MRN [id number]   Chief Complaint   Chief Complaint  Patient presents with   Follow-up    Follow up / ov before procedure   History of Present Illness  Blake Solis is a 68 y.o. male with a history of HTN, HLD, left foot mass and scrotal cyst and prior kidney transplant donation presenting today to discuss recent weight loss and CT imaging findings.   Colonoscopy June 2021: 3 sessile polyps found in the splenic flexure and ascending colon, 10 mm polyp in the transverse colon, few diverticula found in the sigmoid colon.  Pathology revealed tubular adenomas.  Advised to repeat in 5 years.   Abdominal ultrasound September 2021 due to hepatomegaly on physical exam: 7 mm presumed gallbladder polyp, no evidence of cholelithiasis or cholecystitis.  CBD measuring 5 mm, no intrahepatic or extrahepatic biliary ductal dilation.  No hepatosplenomegaly.  Visualized portions of pancreas appeared normal, absent left kidney.  Recommended follow-up ultrasound of the gallbladder in 1 year.   CT abdomen pelvis September 2022: Generalized left scrotal skin thickening and subcutaneous collection in the upper left scrotum, no acute abnormality in the abdomen or pelvis, mild sigmoid diverticulosis without diverticulitis.   Per review of records faxed by PCP: Recent abdominal ultrasound completed 09/10/2021 noting diffuse fatty changes of the liver without biliary ductal dilation.  3 mm nonshadowing echogenicity in the anterior gallbladder wall consistent with history of gallbladder polyp, 1.2 cm density present in the gallbladder indicative of gallstone, no evidence of cholecystitis.  CMP performed 09/10/2021 also with normal LFTs, alk phos, and T. bili.  Hemoglobin stable.  Last office visit  12/07/21.  Had right-sided lower abdominal pain every couple days and does report constipation.  Had a bowel movement every other day, sometimes every 3-4 days with firm stools and straining.  Taking Colace every other day and has tried MiraLAX in the past which was potentially helpful.  Also some occasional right upper quadrant pain 1-2 times per week related to certain foods and issues with reflux and heartburn.  Working night shift therefore when he goes to sleep he needs to prop himself up.  Advise recall for surveillance colonoscopy in 2026.  Follow-up ultrasound in 1 year in regards to gallbladder polyp.  Continue pantoprazole  40 mg daily.  Discussed MiraLAX daily and GERD diet reinforced.  MRI/MRCP November 2024: - Contracted gallbladder - Rounded gallbladder polyp reported by prior ultrasound not clearly visible without evidence of enhancing mass or other abnormality of the gallbladder. - Suspect ultrasound findings reflected a noncalcified gallstone or sludge ball rather than true polyp.  Consider follow-up ultrasound in 3 to 6 months to assess for stability - No biliary dilation  CT Chest/abdomen/pelvis 11/09/23: (reviewed with patient today) 1.    Apparent mild focal gastric fundal wall thickening near the GE junction. While this might be due to underdistention, a focal gastritis or neoplasm cannot be excluded. Recommend clinical correlation and if not already recently performed, advise gastroenterology follow-up for consideration of EGD assessment.  2.    Solid solitary pulmonary nodule, measuring 3 mm at the left lower lobe, new when compared to prior exam. Recommend follow-up noncontrast chest CT in 12 months to ensure stability (Per Fleischner Society guidelines).  3.    Apparent mild diffuse urinary bladder wall thickening, which  may be due to underdistention, however recommend correlation with urinalysis to exclude cystitis.  4.    Mild emphysema.  5.    Mild coronary artery  calcifications.  6.    Status post left nephrectomy.  7.    2 mm nonobstructing right renal stone.  8.    Colonic diverticulosis.   Today:  Discussed the use of AI scribe software for clinical note transcription with the patient, who gave verbal consent to proceed.  He has experienced unexplained weight loss, decreasing from 254 pounds to 232 pounds over the past few months without intentional efforts. His appetite remains unchanged. Our records indicate stable weight from July 2024.   He describes constipation with bowel movements every two to three days, characterized by straining and hard stools, rated as a 'number three' on the [facility] Stool Chart. Abdominal pain and cramping occur before bowel movements and improve after defecation. He has tried Miralax, which caused nausea, and finds Metamucil helpful, though he does not use it regularly.  He has a history of a kidney stone and a current non-obstructing renal stone on the right side. He recalls a past episode of extreme pain when passing a kidney stone. He also has mild emphysema with occasional wheezing but no significant breathing difficulties. He has a history of a left kidney removal and a cyst on his kidney that was previously evaluated by his urologist, who told him it was normal to have a small cyst and that it was shrinking.  He notes occasional reflux symptoms, including burning sensations, but denies daily occurrences, nausea, vomiting, or significant changes in bowel habits. He recalls experiencing black stools once or twice, associated with Metamucil use. Denies any bright red blood per rectum.   He has a family history of cancer, including colon and pancreatic cancer in his brothers. He has a history of polyps and is aware of the need for regular colonoscopies. He is currently on losartan, recently reduced from 100 mg to 50 mg, resulting in improved blood pressure control. He recalls previous issues with low heart rate due to  another blood pressure medication that was discontinued.   Wt Readings from Last 5 Encounters:  11/14/23 232 lb 3.2 oz (105.3 kg)  07/15/23 239 lb (108.4 kg)  02/23/23 242 lb (109.8 kg)  01/11/23 243 lb (110.2 kg)  10/20/22 234 lb (106.1 kg)    Current Outpatient Medications  Medication Sig Dispense Refill   amLODipine  (NORVASC ) 10 MG tablet Take 10 mg by mouth in the morning.     losartan (COZAAR) 100 MG tablet Take 100 mg by mouth in the morning. Pt is taking 50mg      brompheniramine-pseudoephedrine-DM 30-2-10 MG/5ML syrup Take 5 mLs by mouth 4 (four) times daily as needed. (Patient not taking: Reported on 11/14/2023) 120 mL 0   No current facility-administered medications for this visit.    Past Medical History:  Diagnosis Date   Arthritis    Bronchitis, allergic    Essential hypertension    On several medications.   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   Wears glasses     Past Surgical History:  Procedure Laterality Date   ACHILLES TENDON SURGERY Right 10/20/2022   Procedure: ACHILLES TENDON REPAIR;  Surgeon: Dulcie Gaskins  K, DPM;  Location: [facility];  Service: Podiatry;  Laterality: Right;   CARDIAC CATHETERIZATION  04/04/2002   Images reviewed: Normal coronaries   COLONOSCOPY N/A 09/06/2019   Procedure: COLONOSCOPY;  Surgeon: Golda Claudis PENNER, MD;  Location: [facility];  Service: Endoscopy;  Laterality: N/A;  135   CYST EXCISION Right    cyst removed from arm   ELBOW SURGERY Left    HIP ARTHROPLASTY Right    KIDNEY DONATION Left    Was donor for his brother   KNEE ARTHROSCOPY Right 2013   lft ulnar nerve removed     decompression   MASS EXCISION Left 09/14/2021   Procedure: EXCISION SOFT TISSUE MASS LEFT FOOT;   Surgeon: Dulcie Gretta POUR, DPM;  Location: [facility];  Service: Podiatry;  Laterality: Left;  MAC WITH LOCAL   NEPHRECTOMY Left    left, donated to his brother, EF 55-60%...   OSTECTOMY Right 10/20/2022   Procedure: CALCANEAL PARTIAL EXCISION, Flexor digitorum longus tendon transfer, right;  Surgeon: Dulcie Gretta POUR, DPM;  Location: [facility];  Service: Podiatry;  Laterality: Right;   POLYPECTOMY  09/06/2019   Procedure: POLYPECTOMY;  Surgeon: Golda Claudis PENNER, MD;  Location: [facility];  Service: Endoscopy;;   SCROTAL EXPLORATION N/A 01/01/2021   Procedure: SCROTUM EXPLORATION- excision of scrotal sebacous cyst;  Surgeon: Sherrilee Belvie CROME, MD;  Location: [facility];  Service: Urology;  Laterality: N/A;   SCROTAL EXPLORATION N/A 02/08/2022   Procedure: SCROTUM EXPLORATION- excision of cyst;  Surgeon: Sherrilee Belvie CROME, MD;  Location: [facility];  Service: Urology;  Laterality: N/A;   SHOULDER ARTHROSCOPY WITH ROTATOR CUFF REPAIR AND SUBACROMIAL DECOMPRESSION Right 10/14/2017   Procedure: RIGHT SHOULDER ARTHROSCOPY WITH EXTENSIVE DEBRIDEMENT, SUBACROMIAL DECOMPRESSION, DISTAL CLAVICLE EXCISION AND BICEPS TENODYSIS;  Surgeon: Jerri Kay HERO, MD;  Location: [facility];  Service: Orthopedics;  Laterality: Right;   TOTAL HIP ARTHROPLASTY Right 06/07/2012   Procedure: TOTAL HIP ARTHROPLASTY ANTERIOR APPROACH;  Surgeon: Oneil JAYSON Herald, MD;  Location: [facility];  Service: Orthopedics;  Laterality: Right;  Right Total Hip Arthroplasty-Anterior Approach   TRANSTHORACIC ECHOCARDIOGRAM  04/28/2020   [facility]) EF 65 to 70%.  No or WMA.  GR 1 DD.  Mildly thickened aortic valve.  Mild to moderately dilated left atrium.  Normal RV size and function.  Mild RA dilation.:   ULNAR NERVE TRANSPOSITION Left 11/13/2020   Procedure: left elbow ulnar nerve neurolysis, ganglion cyst removal;  Surgeon: Jerri Kay HERO, MD;  Location: [facility];  Service:  Orthopedics;  Laterality: Left;    Family History  Problem Relation Age of Onset   Colon cancer Brother 28   Neurologic Disorder Mother        Lang Brought - by report   Liver disease Father    Other Sister 64       Natural causes   Pancreatic cancer Brother 64       Unknown   Diabetes Mellitus II Brother        Died from DM-Coma @ 61   Diabetic kidney disease Brother 8       End-stage- s/p Txplant (Jeron was the Donor)   Lung cancer Brother 73   Other Sister 64   Healthy Sister        He does not know much details   Arthritis Sister    Diabetes type II Sister    Arthritis Sister    Stomach cancer Sister  Allergies as of 11/14/2023   (No Known Allergies)    Social History   Socioeconomic History   Marital status: Married    Spouse name: Not on file   Number of children: 1   Years of education: Not on file   Highest education level: GED or equivalent  Occupational History   Occupation: Actuary: [employer]    Comment: Now called [employer]  Tobacco Use   Smoking status: Former    Current packs/day: 0.00    Average packs/day: 1 pack/day for 40.0 years (40.0 ttl pk-yrs)    Types: Cigarettes    Start date: 04/22/1958    Quit date: 04/22/1998    Years since quitting: 25.5   Smokeless tobacco: Never  Vaping Use   Vaping status: Never Used  Substance and Sexual Activity   Alcohol use: No   Drug use: No   Sexual activity: Yes  Other Topics Concern   Not on file  Social History Narrative   He is a married father of 1 daughter, grandfather of 1.     His daughter Amie and grandson lives with him and his wife.      He currently works for [employer] as a Pensions consultant.   Former smoker about 40 pack years, quit in 2000      Does lots of exercise and physical activity and work, but does not do routine exercise otherwise.       Social Drivers of Corporate investment banker Strain: Not on file  Food Insecurity: Not on  file  Transportation Needs: Not on file  Physical Activity: Not on file  Stress: Not on file  Social Connections: Unknown (08/12/2022)   Received from [facility]   Social Network    Social Network: Not on file     Review of Systems   Gen: Denies fever, chills, anorexia. Denies fatigue, weakness, weight loss.  CV: Denies chest pain, palpitations, syncope, peripheral edema, and claudication. Resp: Denies dyspnea at rest, cough, wheezing, coughing up blood, and pleurisy. GI: See HPI Derm: Denies rash, itching, dry skin Psych: Denies depression, anxiety, memory loss, confusion. No homicidal or suicidal ideation.  Heme: Denies bruising, bleeding, and enlarged lymph nodes.  Physical Exam   BP 126/72   Pulse (!) 51   Temp 98.6 F (37 C)   Ht 5' 10 (1.778 m)   Wt 232 lb 3.2 oz (105.3 kg)   BMI 33.32 kg/m   General:   Alert and oriented. No distress noted. Pleasant and cooperative.  Head:  Normocephalic and atraumatic. Eyes:  Conjuctiva clear without scleral icterus. Mouth:  Oral mucosa pink and moist. Good dentition. No lesions. Lungs:  Clear to auscultation bilaterally. No wheezes, rales, or rhonchi. No distress.  Heart:  S1, S2 present without murmurs appreciated.  Abdomen:  +BS, soft, non-tender and non-distended. No rebound or guarding. No HSM or masses noted. Rectal: deferred Msk:  Symmetrical without gross deformities. Normal posture. Extremities:  Without edema. Neurologic:  Alert and  oriented x4 Psych:  Alert and cooperative. Normal mood and affect.  Assessment  Blake Solis is a 67 y.o. male presenting today with reports of some unintentional weight loss and recent CT with abnormal findings requiring further evaluation.     Unintentional weight loss and fatigue, history of colon polyps Unintentional weight loss from 254 lbs to 232 lbs over a few months with associated fatigue. Potential causes include esophagogastric junction wall thickening or other underlying  conditions. Family history of colon and pancreatic cancer. Last colonoscopy in 2021 with 4 tubular adenomas removed (one about 10mm). Recent CT scan showed esophagogastric junction wall thickening and lung nodule.  - Request recent labs from Dr. Trudy, including CBC and vitamin D levels. Need to assess for iron deficiency if not evaluated.  - Schedule colonoscopy and upper endoscopy to evaluate esophagogastric junction wall thickening and assess for potential malignancy given weight loss and history of colon polyps.  - Monitor weight over the next 1-2 months to ensure no further weight loss. - Follow up on lung nodule evaluation as recommended by pulmonology.  Chronic constipation with straining Chronic constipation with straining, bowel movements every 2-3 days with cramping and pain. Miralax caused nausea, Metamucil helpful but not used regularly. No significant changes in bowel habits or rectal bleeding reported. - Recommend regular use of Metamucil or psyllium tablets for fiber supplementation. - Provide Dulcolax to take before and during colonoscopy prep to ensure bowel clearance.  Esophagogastric junction wall thickening, under evaluation CT scan showed wall thickening at the esophagogastric junction. Differential includes inflammation from acid reflux or potential malignancy. Occasional reflux symptoms reported. Family history of cancer noted (pancreatic and stomach cancer history in a sister) - Perform upper endoscopy to evaluate esophagogastric junction wall thickening and rule out malignancy. - Will start PPI if gastritis or esophagitis noted.  - GERD diet  Lung nodule, under evaluation Lung nodule identified on recent CT scan. Differential includes benign nodule or potential malignancy. Has reported unintentional weight loss as above and mild shortness of breath and/or wheezing.  - Follow up with pulmonology or other as recommended by PCP for further evaluation of lung  nodule.  Mild emphysema Mild emphysema noted on CT scan. Symptoms include occasional wheezing. No significant respiratory distress reported. - Monitor respiratory symptoms and follow up as needed.      Recent CT without gallstones or mention of gallbladder polyp. If labs indicated elevated LFTs can consider repeat US  as recommended by radiology post MRCP in November 2024.   Proceed with upper endoscopy and colonoscopy with propofol  by Dr. Shaaron in near future: the risks, benefits, and alternatives have been discussed with the patient in detail. The patient states understanding and desires to proceed. ASA 2  Given constipation will augment prep with Dulcolax before and halfway through prep  PLAN   Follow up post procedure 1-2 months.    Charmaine Melia, MSN, FNP-BC, AGACNP-BC [facility]

## 2023-11-14 NOTE — Telephone Encounter (Signed)
 Patient called back requesting appt

## 2023-11-14 NOTE — H&P (View-Only) (Signed)
 GI Office Note    Referring Provider: [facility] Primary Care Physician:  [facility] Primary Gastroenterologist: Lamar HERO.Rourk, MD  Date:  11/14/2023  ID:  BURTIS IMHOFF, DOB 18-Aug-1955, MRN [id number]   Chief Complaint   Chief Complaint  Patient presents with   Follow-up    Follow up / ov before procedure   History of Present Illness  KAITLYN SKOWRON is a 68 y.o. male with a history of HTN, HLD, left foot mass and scrotal cyst and prior kidney transplant donation presenting today to discuss recent weight loss and CT imaging findings.   Colonoscopy June 2021: 3 sessile polyps found in the splenic flexure and ascending colon, 10 mm polyp in the transverse colon, few diverticula found in the sigmoid colon.  Pathology revealed tubular adenomas.  Advised to repeat in 5 years.   Abdominal ultrasound September 2021 due to hepatomegaly on physical exam: 7 mm presumed gallbladder polyp, no evidence of cholelithiasis or cholecystitis.  CBD measuring 5 mm, no intrahepatic or extrahepatic biliary ductal dilation.  No hepatosplenomegaly.  Visualized portions of pancreas appeared normal, absent left kidney.  Recommended follow-up ultrasound of the gallbladder in 1 year.   CT abdomen pelvis September 2022: Generalized left scrotal skin thickening and subcutaneous collection in the upper left scrotum, no acute abnormality in the abdomen or pelvis, mild sigmoid diverticulosis without diverticulitis.   Per review of records faxed by PCP: Recent abdominal ultrasound completed 09/10/2021 noting diffuse fatty changes of the liver without biliary ductal dilation.  3 mm nonshadowing echogenicity in the anterior gallbladder wall consistent with history of gallbladder polyp, 1.2 cm density present in the gallbladder indicative of gallstone, no evidence of cholecystitis.  CMP performed 09/10/2021 also with normal LFTs, alk phos, and T. bili.  Hemoglobin stable.  Last office visit  12/07/21.  Had right-sided lower abdominal pain every couple days and does report constipation.  Had a bowel movement every other day, sometimes every 3-4 days with firm stools and straining.  Taking Colace every other day and has tried MiraLAX in the past which was potentially helpful.  Also some occasional right upper quadrant pain 1-2 times per week related to certain foods and issues with reflux and heartburn.  Working night shift therefore when he goes to sleep he needs to prop himself up.  Advise recall for surveillance colonoscopy in 2026.  Follow-up ultrasound in 1 year in regards to gallbladder polyp.  Continue pantoprazole  40 mg daily.  Discussed MiraLAX daily and GERD diet reinforced.  MRI/MRCP November 2024: - Contracted gallbladder - Rounded gallbladder polyp reported by prior ultrasound not clearly visible without evidence of enhancing mass or other abnormality of the gallbladder. - Suspect ultrasound findings reflected a noncalcified gallstone or sludge ball rather than true polyp.  Consider follow-up ultrasound in 3 to 6 months to assess for stability - No biliary dilation  CT Chest/abdomen/pelvis 11/09/23: (reviewed with patient today) 1.    Apparent mild focal gastric fundal wall thickening near the GE junction. While this might be due to underdistention, a focal gastritis or neoplasm cannot be excluded. Recommend clinical correlation and if not already recently performed, advise gastroenterology follow-up for consideration of EGD assessment.  2.    Solid solitary pulmonary nodule, measuring 3 mm at the left lower lobe, new when compared to prior exam. Recommend follow-up noncontrast chest CT in 12 months to ensure stability (Per Fleischner Society guidelines).  3.    Apparent mild diffuse urinary bladder wall thickening, which  may be due to underdistention, however recommend correlation with urinalysis to exclude cystitis.  4.    Mild emphysema.  5.    Mild coronary artery  calcifications.  6.    Status post left nephrectomy.  7.    2 mm nonobstructing right renal stone.  8.    Colonic diverticulosis.   Today:  Discussed the use of AI scribe software for clinical note transcription with the patient, who gave verbal consent to proceed.  He has experienced unexplained weight loss, decreasing from 254 pounds to 232 pounds over the past few months without intentional efforts. His appetite remains unchanged. Our records indicate stable weight from July 2024.   He describes constipation with bowel movements every two to three days, characterized by straining and hard stools, rated as a 'number three' on the [facility] Stool Chart. Abdominal pain and cramping occur before bowel movements and improve after defecation. He has tried Miralax, which caused nausea, and finds Metamucil helpful, though he does not use it regularly.  He has a history of a kidney stone and a current non-obstructing renal stone on the right side. He recalls a past episode of extreme pain when passing a kidney stone. He also has mild emphysema with occasional wheezing but no significant breathing difficulties. He has a history of a left kidney removal and a cyst on his kidney that was previously evaluated by his urologist, who told him it was normal to have a small cyst and that it was shrinking.  He notes occasional reflux symptoms, including burning sensations, but denies daily occurrences, nausea, vomiting, or significant changes in bowel habits. He recalls experiencing black stools once or twice, associated with Metamucil use. Denies any bright red blood per rectum.   He has a family history of cancer, including colon and pancreatic cancer in his brothers. He has a history of polyps and is aware of the need for regular colonoscopies. He is currently on losartan, recently reduced from 100 mg to 50 mg, resulting in improved blood pressure control. He recalls previous issues with low heart rate due to  another blood pressure medication that was discontinued.   Wt Readings from Last 5 Encounters:  11/14/23 232 lb 3.2 oz (105.3 kg)  07/15/23 239 lb (108.4 kg)  02/23/23 242 lb (109.8 kg)  01/11/23 243 lb (110.2 kg)  10/20/22 234 lb (106.1 kg)    Current Outpatient Medications  Medication Sig Dispense Refill   amLODipine  (NORVASC ) 10 MG tablet Take 10 mg by mouth in the morning.     losartan (COZAAR) 100 MG tablet Take 100 mg by mouth in the morning. Pt is taking 50mg      brompheniramine-pseudoephedrine-DM 30-2-10 MG/5ML syrup Take 5 mLs by mouth 4 (four) times daily as needed. (Patient not taking: Reported on 11/14/2023) 120 mL 0   No current facility-administered medications for this visit.    Past Medical History:  Diagnosis Date   Arthritis    Bronchitis, allergic    Essential hypertension    On several medications.   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   Wears glasses     Past Surgical History:  Procedure Laterality Date   ACHILLES TENDON SURGERY Right 10/20/2022   Procedure: ACHILLES TENDON REPAIR;  Surgeon: Dulcie Gaskins  K, DPM;  Location: [facility];  Service: Podiatry;  Laterality: Right;   CARDIAC CATHETERIZATION  04/04/2002   Images reviewed: Normal coronaries   COLONOSCOPY N/A 09/06/2019   Procedure: COLONOSCOPY;  Surgeon: Golda Claudis PENNER, MD;  [facility];  Service: Endoscopy;  Laterality: N/A;  135   CYST EXCISION Right    cyst removed from arm   ELBOW SURGERY Left    HIP ARTHROPLASTY Right    KIDNEY DONATION Left    Was donor for his brother   KNEE ARTHROSCOPY Right 2013   lft ulnar nerve removed     decompression   MASS EXCISION Left 09/14/2021   Procedure: EXCISION SOFT TISSUE MASS LEFT FOOT;   Surgeon: Dulcie Gretta POUR, DPM;  Location: [facility];  Service: Podiatry;  Laterality: Left;  MAC WITH LOCAL   NEPHRECTOMY Left    left, donated to his brother, EF 55-60%...   OSTECTOMY Right 10/20/2022   Procedure: CALCANEAL PARTIAL EXCISION, Flexor digitorum longus tendon transfer, right;  Surgeon: Dulcie Gretta POUR, DPM;  Location: [facility];  Service: Podiatry;  Laterality: Right;   POLYPECTOMY  09/06/2019   Procedure: POLYPECTOMY;  Surgeon: Golda Claudis PENNER, MD;  [facility]: [facility];  Service: Endoscopy;;   SCROTAL EXPLORATION N/A 01/01/2021   Procedure: SCROTUM EXPLORATION- excision of scrotal sebacous cyst;  Surgeon: Sherrilee Belvie CROME, MD;  Location: [facility];  Service: Urology;  Laterality: N/A;   SCROTAL EXPLORATION N/A 02/08/2022   Procedure: SCROTUM EXPLORATION- excision of cyst;  Surgeon: Sherrilee Belvie CROME, MD;  Location: [facility];  Service: Urology;  Laterality: N/A;   SHOULDER ARTHROSCOPY WITH ROTATOR CUFF REPAIR AND SUBACROMIAL DECOMPRESSION Right 10/14/2017   Procedure: RIGHT SHOULDER ARTHROSCOPY WITH EXTENSIVE DEBRIDEMENT, SUBACROMIAL DECOMPRESSION, DISTAL CLAVICLE EXCISION AND BICEPS TENODYSIS;  Surgeon: Jerri Kay HERO, MD;  Location: [facility];  Service: Orthopedics;  Laterality: Right;   TOTAL HIP ARTHROPLASTY Right 06/07/2012   Procedure: TOTAL HIP ARTHROPLASTY ANTERIOR APPROACH;  Surgeon: Oneil JAYSON Herald, MD;  Location: [facility];  Service: Orthopedics;  Laterality: Right;  Right Total Hip Arthroplasty-Anterior Approach   TRANSTHORACIC ECHOCARDIOGRAM  04/28/2020   [facility]) EF 65 to 70%.  No or WMA.  GR 1 DD.  Mildly thickened aortic valve.  Mild to moderately dilated left atrium.  Normal RV size and function.  Mild RA dilation.:   ULNAR NERVE TRANSPOSITION Left 11/13/2020   Procedure: left elbow ulnar nerve neurolysis, ganglion cyst removal;  Surgeon: Jerri Kay HERO, MD;  Location: [facility];  Service:  Orthopedics;  Laterality: Left;    Family History  Problem Relation Age of Onset   Colon cancer Brother 28   Neurologic Disorder Mother        Lang Brought - by report   Liver disease Father    Other Sister 64       Natural causes   Pancreatic cancer Brother 64       Unknown   Diabetes Mellitus II Brother        Died from DM-Coma @ 61   Diabetic kidney disease Brother 8       End-stage- s/p Txplant (Jeron was the Donor)   Lung cancer Brother 73   Other Sister 64   Healthy Sister        He does not know much details   Arthritis Sister    Diabetes type II Sister    Arthritis Sister    Stomach cancer Sister  Allergies as of 11/14/2023   (No Known Allergies)    Social History   Socioeconomic History   Marital status: Married    Spouse name: Not on file   Number of children: 1   Years of education: Not on file   Highest education level: GED or equivalent  Occupational History   Occupation: Actuary: [employer]    Comment: Now called [employer]  Tobacco Use   Smoking status: Former    Current packs/day: 0.00    Average packs/day: 1 pack/day for 40.0 years (40.0 ttl pk-yrs)    Types: Cigarettes    Start date: 04/22/1958    Quit date: 04/22/1998    Years since quitting: 25.5   Smokeless tobacco: Never  Vaping Use   Vaping status: Never Used  Substance and Sexual Activity   Alcohol use: No   Drug use: No   Sexual activity: Yes  Other Topics Concern   Not on file  Social History Narrative   He is a married father of 1 daughter, grandfather of 1.     His daughter Amie and grandson lives with him and his wife.      He currently works for [employer] as a Pensions consultant.   Former smoker about 40 pack years, quit in 2000      Does lots of exercise and physical activity and work, but does not do routine exercise otherwise.       Social Drivers of Corporate investment banker Strain: Not on file  Food Insecurity: Not on  file  Transportation Needs: Not on file  Physical Activity: Not on file  Stress: Not on file  Social Connections: Unknown (08/12/2022)   Received from [facility]   Social Network    Social Network: Not on file     Review of Systems   Gen: Denies fever, chills, anorexia. Denies fatigue, weakness, weight loss.  CV: Denies chest pain, palpitations, syncope, peripheral edema, and claudication. Resp: Denies dyspnea at rest, cough, wheezing, coughing up blood, and pleurisy. GI: See HPI Derm: Denies rash, itching, dry skin Psych: Denies depression, anxiety, memory loss, confusion. No homicidal or suicidal ideation.  Heme: Denies bruising, bleeding, and enlarged lymph nodes.  Physical Exam   BP 126/72   Pulse (!) 51   Temp 98.6 F (37 C)   Ht 5' 10 (1.778 m)   Wt 232 lb 3.2 oz (105.3 kg)   BMI 33.32 kg/m   General:   Alert and oriented. No distress noted. Pleasant and cooperative.  Head:  Normocephalic and atraumatic. Eyes:  Conjuctiva clear without scleral icterus. Mouth:  Oral mucosa pink and moist. Good dentition. No lesions. Lungs:  Clear to auscultation bilaterally. No wheezes, rales, or rhonchi. No distress.  Heart:  S1, S2 present without murmurs appreciated.  Abdomen:  +BS, soft, non-tender and non-distended. No rebound or guarding. No HSM or masses noted. Rectal: deferred Msk:  Symmetrical without gross deformities. Normal posture. Extremities:  Without edema. Neurologic:  Alert and  oriented x4 Psych:  Alert and cooperative. Normal mood and affect.  Assessment  LONI ABDON is a 67 y.o. male presenting today with reports of some unintentional weight loss and recent CT with abnormal findings requiring further evaluation.     Unintentional weight loss and fatigue, history of colon polyps Unintentional weight loss from 254 lbs to 232 lbs over a few months with associated fatigue. Potential causes include esophagogastric junction wall thickening or other underlying  conditions. Family history of colon and pancreatic cancer. Last colonoscopy in 2021 with 4 tubular adenomas removed (one about 10mm). Recent CT scan showed esophagogastric junction wall thickening and lung nodule.  - Request recent labs from Dr. Trudy, including CBC and vitamin D levels. Need to assess for iron deficiency if not evaluated.  - Schedule colonoscopy and upper endoscopy to evaluate esophagogastric junction wall thickening and assess for potential malignancy given weight loss and history of colon polyps.  - Monitor weight over the next 1-2 months to ensure no further weight loss. - Follow up on lung nodule evaluation as recommended by pulmonology.  Chronic constipation with straining Chronic constipation with straining, bowel movements every 2-3 days with cramping and pain. Miralax caused nausea, Metamucil helpful but not used regularly. No significant changes in bowel habits or rectal bleeding reported. - Recommend regular use of Metamucil or psyllium tablets for fiber supplementation. - Provide Dulcolax to take before and during colonoscopy prep to ensure bowel clearance.  Esophagogastric junction wall thickening, under evaluation CT scan showed wall thickening at the esophagogastric junction. Differential includes inflammation from acid reflux or potential malignancy. Occasional reflux symptoms reported. Family history of cancer noted (pancreatic and stomach cancer history in a sister) - Perform upper endoscopy to evaluate esophagogastric junction wall thickening and rule out malignancy. - Will start PPI if gastritis or esophagitis noted.  - GERD diet  Lung nodule, under evaluation Lung nodule identified on recent CT scan. Differential includes benign nodule or potential malignancy. Has reported unintentional weight loss as above and mild shortness of breath and/or wheezing.  - Follow up with pulmonology or other as recommended by PCP for further evaluation of lung  nodule.  Mild emphysema Mild emphysema noted on CT scan. Symptoms include occasional wheezing. No significant respiratory distress reported. - Monitor respiratory symptoms and follow up as needed.      Recent CT without gallstones or mention of gallbladder polyp. If labs indicated elevated LFTs can consider repeat US  as recommended by radiology post MRCP in November 2024.   Proceed with upper endoscopy and colonoscopy with propofol  by Dr. Shaaron in near future: the risks, benefits, and alternatives have been discussed with the patient in detail. The patient states understanding and desires to proceed. ASA 2  Given constipation will augment prep with Dulcolax before and halfway through prep  PLAN   Follow up post procedure 1-2 months.    Charmaine Melia, MSN, FNP-BC, AGACNP-BC [facility]

## 2023-11-14 NOTE — Telephone Encounter (Signed)
 Labs requested

## 2023-11-16 ENCOUNTER — Ambulatory Visit (HOSPITAL_COMMUNITY): Admission: RE | Admit: 2023-11-16 | Source: Home / Self Care | Admitting: Internal Medicine

## 2023-11-16 ENCOUNTER — Ambulatory Visit (HOSPITAL_COMMUNITY): Admitting: Certified Registered"

## 2023-11-16 ENCOUNTER — Telehealth: Payer: Self-pay

## 2023-11-16 ENCOUNTER — Ambulatory Visit (HOSPITAL_COMMUNITY)
Admission: RE | Admit: 2023-11-16 | Discharge: 2023-11-16 | Disposition: A | Discharge status: Home / Self Care | Attending: Internal Medicine | Admitting: Internal Medicine

## 2023-11-16 ENCOUNTER — Other Ambulatory Visit: Payer: Self-pay | Admitting: *Deleted

## 2023-11-16 ENCOUNTER — Encounter (HOSPITAL_COMMUNITY)
Admission: RE | Disposition: A | Discharge status: Home / Self Care | Payer: Self-pay | Source: Home / Self Care | Attending: Internal Medicine

## 2023-11-16 ENCOUNTER — Encounter (HOSPITAL_COMMUNITY): Admission: RE | Payer: Self-pay | Source: Home / Self Care

## 2023-11-16 DIAGNOSIS — Z87891 Personal history of nicotine dependence: Secondary | ICD-10-CM | POA: Insufficient documentation

## 2023-11-16 DIAGNOSIS — E66813 Obesity, class 3: Secondary | ICD-10-CM | POA: Diagnosis not present

## 2023-11-16 DIAGNOSIS — K5730 Diverticulosis of large intestine without perforation or abscess without bleeding: Secondary | ICD-10-CM | POA: Insufficient documentation

## 2023-11-16 DIAGNOSIS — R933 Abnormal findings on diagnostic imaging of other parts of digestive tract: Secondary | ICD-10-CM | POA: Insufficient documentation

## 2023-11-16 DIAGNOSIS — Z1211 Encounter for screening for malignant neoplasm of colon: Secondary | ICD-10-CM | POA: Diagnosis present

## 2023-11-16 DIAGNOSIS — Z800 Family history of malignant neoplasm of digestive organs: Secondary | ICD-10-CM | POA: Diagnosis not present

## 2023-11-16 DIAGNOSIS — K2100 Gastro-esophageal reflux disease with esophagitis, without bleeding: Secondary | ICD-10-CM | POA: Diagnosis not present

## 2023-11-16 DIAGNOSIS — K5909 Other constipation: Secondary | ICD-10-CM | POA: Diagnosis not present

## 2023-11-16 DIAGNOSIS — K254 Chronic or unspecified gastric ulcer with hemorrhage: Secondary | ICD-10-CM | POA: Insufficient documentation

## 2023-11-16 DIAGNOSIS — K2210 Ulcer of esophagus without bleeding: Secondary | ICD-10-CM | POA: Diagnosis not present

## 2023-11-16 DIAGNOSIS — K219 Gastro-esophageal reflux disease without esophagitis: Secondary | ICD-10-CM | POA: Diagnosis not present

## 2023-11-16 DIAGNOSIS — E785 Hyperlipidemia, unspecified: Secondary | ICD-10-CM | POA: Insufficient documentation

## 2023-11-16 DIAGNOSIS — Z6833 Body mass index (BMI) 33.0-33.9, adult: Secondary | ICD-10-CM | POA: Diagnosis not present

## 2023-11-16 DIAGNOSIS — R634 Abnormal weight loss: Secondary | ICD-10-CM | POA: Insufficient documentation

## 2023-11-16 DIAGNOSIS — Z860100 Personal history of colon polyps, unspecified: Secondary | ICD-10-CM | POA: Diagnosis not present

## 2023-11-16 DIAGNOSIS — Z905 Acquired absence of kidney: Secondary | ICD-10-CM | POA: Insufficient documentation

## 2023-11-16 DIAGNOSIS — K259 Gastric ulcer, unspecified as acute or chronic, without hemorrhage or perforation: Secondary | ICD-10-CM

## 2023-11-16 DIAGNOSIS — I10 Essential (primary) hypertension: Secondary | ICD-10-CM | POA: Diagnosis not present

## 2023-11-16 DIAGNOSIS — Z79899 Other long term (current) drug therapy: Secondary | ICD-10-CM | POA: Insufficient documentation

## 2023-11-16 HISTORY — PX: ESOPHAGOGASTRODUODENOSCOPY: SHX5428

## 2023-11-16 HISTORY — PX: COLONOSCOPY: SHX5424

## 2023-11-16 SURGERY — COLONOSCOPY
Anesthesia: Choice

## 2023-11-16 SURGERY — COLONOSCOPY
Anesthesia: General

## 2023-11-16 MED ORDER — PROPOFOL 10 MG/ML IV BOLUS
INTRAVENOUS | Status: DC | PRN
  Administered 2023-11-16: 30 mg via INTRAVENOUS
  Administered 2023-11-16 (×2): 20 mg via INTRAVENOUS
  Administered 2023-11-16: 30 mg via INTRAVENOUS
  Administered 2023-11-16: 100 mg via INTRAVENOUS
  Administered 2023-11-16: 50 mg via INTRAVENOUS
  Administered 2023-11-16: 30 mg via INTRAVENOUS
  Administered 2023-11-16 (×2): 20 mg via INTRAVENOUS
  Administered 2023-11-16: 30 mg via INTRAVENOUS

## 2023-11-16 MED ORDER — STERILE WATER FOR IRRIGATION IR SOLN
Status: DC | PRN
  Administered 2023-11-16: 100 mL

## 2023-11-16 MED ORDER — PANTOPRAZOLE SODIUM 40 MG PO TBEC
40.0000 mg | DELAYED_RELEASE_TABLET | Freq: Two times a day (BID) | ORAL | 3 refills | Status: AC
Start: 2023-11-16 — End: ?

## 2023-11-16 MED ORDER — LIDOCAINE HCL (CARDIAC) PF 100 MG/5ML IV SOSY
PREFILLED_SYRINGE | INTRAVENOUS | Status: DC | PRN
  Administered 2023-11-16: 100 mg via INTRAVENOUS

## 2023-11-16 MED ORDER — LACTATED RINGERS IV SOLN: INTRAVENOUS | Status: DC | PRN

## 2023-11-16 NOTE — Anesthesia Preprocedure Evaluation (Signed)
 Anesthesia Evaluation  Patient identified by MRN, date of birth, ID band Patient awake    Reviewed: Allergy & Precautions, H&P , NPO status , Patient's Chart, lab work & pertinent test results, reviewed documented beta blocker date and time   Airway Mallampati: II  TM Distance: >3 FB Neck ROM: full    Dental no notable dental hx.    Pulmonary neg pulmonary ROS, former smoker   Pulmonary exam normal breath sounds clear to auscultation       Cardiovascular Exercise Tolerance: Good hypertension,  Rhythm:regular Rate:Normal     Neuro/Psych  Neuromuscular disease  negative psych ROS   GI/Hepatic Neg liver ROS,GERD  ,,  Endo/Other    Class 3 obesity  Renal/GU Renal disease  negative genitourinary   Musculoskeletal   Abdominal   Peds  Hematology negative hematology ROS (+)   Anesthesia Other Findings   Reproductive/Obstetrics negative OB ROS                              Anesthesia Physical Anesthesia Plan  ASA: 3  Anesthesia Plan: General   Post-op Pain Management:    Induction:   PONV Risk Score and Plan: Propofol  infusion  Airway Management Planned:   Additional Equipment:   Intra-op Plan:   Post-operative Plan:   Informed Consent: I have reviewed the patients History and Physical, chart, labs and discussed the procedure including the risks, benefits and alternatives for the proposed anesthesia with the patient or authorized representative who has indicated his/her understanding and acceptance.     Dental Advisory Given  Plan Discussed with: CRNA  Anesthesia Plan Comments:         Anesthesia Quick Evaluation

## 2023-11-16 NOTE — Telephone Encounter (Signed)
-----   Message from Lamar Hollingshead sent at 11/16/2023 12:44 PM EDT -----  patient needs a follow-up with Charmaine Melia in 4 to 6 weeks.  New prescription for Protonix  40 mg pill dispense 60 with 3 refills.  Take 1 twice a day before breakfast and supper.

## 2023-11-16 NOTE — Anesthesia Postprocedure Evaluation (Signed)
 Anesthesia Post Note  Patient: Blake Solis  Procedure(s) Performed: COLONOSCOPY EGD (ESOPHAGOGASTRODUODENOSCOPY)  Patient location during evaluation: Phase II Anesthesia Type: General Level of consciousness: awake, awake and alert and oriented Pain management: pain level controlled Respiratory status: spontaneous breathing Cardiovascular status: blood pressure returned to baseline and stable Anesthetic complications: no   No notable events documented.   Last Vitals: There were no vitals filed for this visit.  Last Pain:  Vitals:   11/16/23 1201  PainSc: 0-No pain                 [name]

## 2023-11-16 NOTE — Transfer of Care (Signed)
 Immediate Anesthesia Transfer of Care Note  Patient: Blake Solis  Procedure(s) Performed: COLONOSCOPY EGD (ESOPHAGOGASTRODUODENOSCOPY)  Patient Location: Short Stay  Anesthesia Type:General  Level of Consciousness: awake, alert , oriented, and patient cooperative  Airway & Oxygen Therapy: Patient Spontanous Breathing  Post-op Assessment: Report given to RN and Post -op Vital signs reviewed and stable  Post vital signs: Reviewed and stable  Last Vitals:  Vitals Value Taken Time  BP    Temp    Pulse    Resp    SpO2      Last Pain:  Vitals:   11/16/23 1201  PainSc: 0-No pain         Complications: No notable events documented.

## 2023-11-16 NOTE — Op Note (Signed)
 [facility] Patient Name: Blake Solis Procedure Date: 11/16/2023 11:45 AM MRN: [id number] Date of Birth: Sep 30, 1955 Attending MD: Lamar Ozell Hollingshead , MD, [id number] CSN: [id number] Age: 68 Admit Type: Outpatient Procedure:                Colonoscopy Indications:              High risk colon cancer surveillance: Personal                            history of colonic polyps Providers:                Lamar Ozell Hollingshead, MD, Jon LABOR. Gerome RN, RN,                            [place]aly Wilson, Technician Referring MD:              Medicines:                Propofol  per Anesthesia Complications:            No immediate complications. Estimated Blood Loss:     Estimated blood loss: none. Procedure:                Pre-Anesthesia Assessment:                           - Prior to the procedure, a History and Physical                            was performed, and patient medications and                            allergies were reviewed. The patient's tolerance of                            previous anesthesia was also reviewed. The risks                            and benefits of the procedure and the sedation                            options and risks were discussed with the patient.                            All questions were answered, and informed consent                            was obtained. Prior Anticoagulants: The patient has                            taken no anticoagulant or antiplatelet agents.                            After reviewing the risks and benefits, the patient  was deemed in satisfactory condition to undergo the                            procedure.                           After obtaining informed consent, the colonoscope                            was passed under direct vision. Throughout the                            procedure, the patient's blood pressure, pulse, and                            oxygen saturations were  monitored continuously. The                            [id number] ([id number]) Colon was introduced through                            the anus and advanced to the the cecum, identified                            by appendiceal orifice and ileocecal valve. Scope In: 12:18:53 PM Scope Out: 12:30:32 PM Scope Withdrawal Time: 0 hours 8 minutes 50 seconds  Total Procedure Duration: 0 hours 11 minutes 39 seconds  Findings:      The perianal and digital rectal examinations were normal.      Scattered medium-mouthed diverticula were found in the entire colon.      The exam was otherwise without abnormality on direct and retroflexion       views. Impression:               - Diverticulosis in the entire examined colon.                           - The examination was otherwise normal on direct                            and retroflexion views.                           - No specimens collected. Moderate Sedation:      Moderate (conscious) sedation was personally administered by an       anesthesia professional. The following parameters were monitored: oxygen       saturation, heart rate, blood pressure, respiratory rate, EKG, adequacy       of pulmonary ventilation, and response to care. Recommendation:           - Patient has a contact number available for                            emergencies. The signs and symptoms of potential  delayed complications were discussed with the                            patient. Return to normal activities tomorrow.                            Written discharge instructions were provided to the                            patient.                           - Advance diet as tolerated.                           - Repeat colonoscopy in 7 years for surveillance.                           - Return to GI office in 4 weeks. See colonoscopy                            report. Procedure Code(s):        --- Professional ---                            [phone], Colonoscopy, flexible; diagnostic, including                            collection of specimen(s) by brushing or washing,                            when performed (separate procedure) Diagnosis Code(s):        --- Professional ---                           Z86.010, Personal history of colonic polyps                           K57.30, Diverticulosis of large intestine without                            perforation or abscess without bleeding CPT copyright 2022 [employer]. All rights reserved. The codes documented in this report are preliminary and upon coder review may  be revised to meet current compliance requirements. Lamar HERO. Maliq Pilley, MD Lamar Ozell Hollingshead, MD 11/16/2023 12:37:59 PM This report has been signed electronically. Number of Addenda: 0

## 2023-11-16 NOTE — Interval H&P Note (Signed)
 History and Physical Interval Note:  11/16/2023 11:15 AM  Blake Solis  has presented today for surgery, with the diagnosis of weight loss, abnormal GE junction on CT,history of colon polyps,family history of colon cancer.  The various methods of treatment have been discussed with the patient and family. After consideration of risks, benefits and other options for treatment, the patient has consented to  Procedure(s) with comments: COLONOSCOPY (N/A) - 11:00 am, asa 2 EGD (ESOPHAGOGASTRODUODENOSCOPY) (N/A) as a surgical intervention.  The patient's history has been reviewed, patient examined, no change in status, stable for surgery.  I have reviewed the patient's chart and labs.  Questions were answered to the patient's satisfaction.     Baine Decesare    No change.  Agree with need for EGD and colonoscopy weight loss abnormal GE junction on CT history of colonic polyps.    denies dysphagia.The risks, benefits, limitations, imponderables and alternatives regarding both EGD and colonoscopy have been reviewed with the patient. Questions have been answered. All parties agreeable.

## 2023-11-16 NOTE — Discharge Instructions (Addendum)
 EGD Discharge instructions Please read the instructions outlined below and refer to this sheet in the next few weeks. These discharge instructions provide you with general information on caring for yourself after you leave the hospital. Your doctor may also give you specific instructions. While your treatment has been planned according to the most current medical practices available, unavoidable complications occasionally occur. If you have any problems or questions after discharge, please call your doctor. ACTIVITY You may resume your regular activity but move at a slower pace for the next 24 hours.  Take frequent rest periods for the next 24 hours.  Walking will help expel (get rid of) the air and reduce the bloated feeling in your abdomen.  No driving for 24 hours (because of the anesthesia (medicine) used during the test).  You may shower.  Do not sign any important legal documents or operate any machinery for 24 hours (because of the anesthesia used during the test).  NUTRITION Drink plenty of fluids.  You may resume your normal diet.  Begin with a light meal and progress to your normal diet.  Avoid alcoholic beverages for 24 hours or as instructed by your caregiver.  MEDICATIONS You may resume your normal medications unless your caregiver tells you otherwise.  WHAT YOU CAN EXPECT TODAY You may experience abdominal discomfort such as a feeling of fullness or "gas" pains.  FOLLOW-UP Your doctor will discuss the results of your test with you.  SEEK IMMEDIATE MEDICAL ATTENTION IF ANY OF THE FOLLOWING OCCUR: Excessive nausea (feeling sick to your stomach) and/or vomiting.  Severe abdominal pain and distention (swelling).  Trouble swallowing.  Temperature over 101 F (37.8 C).  Rectal bleeding or vomiting of blood.    Colonoscopy Discharge Instructions  Read the instructions outlined below and refer to this sheet in the next few weeks. These discharge instructions provide you with  general information on caring for yourself after you leave the hospital. Your doctor may also give you specific instructions. While your treatment has been planned according to the most current medical practices available, unavoidable complications occasionally occur. If you have any problems or questions after discharge, call Dr. Shaaron at [phone]. ACTIVITY You may resume your regular activity, but move at a slower pace for the next 24 hours.  Take frequent rest periods for the next 24 hours.  Walking will help get rid of the air and reduce the bloated feeling in your belly (abdomen).  No driving for 24 hours (because of the medicine (anesthesia) used during the test).   Do not sign any important legal documents or operate any machinery for 24 hours (because of the anesthesia used during the test).  NUTRITION Drink plenty of fluids.  You may resume your normal diet as instructed by your doctor.  Begin with a light meal and progress to your normal diet. Heavy or fried foods are harder to digest and may make you feel sick to your stomach (nauseated).  Avoid alcoholic beverages for 24 hours or as instructed.  MEDICATIONS You may resume your normal medications unless your doctor tells you otherwise.  WHAT YOU CAN EXPECT TODAY Some feelings of bloating in the abdomen.  Passage of more gas than usual.  Spotting of blood in your stool or on the toilet paper.  IF YOU HAD POLYPS REMOVED DURING THE COLONOSCOPY: No aspirin  products for 7 days or as instructed.  No alcohol for 7 days or as instructed.  Eat a soft diet for the next 24 hours.  FINDING  OUT THE RESULTS OF YOUR TEST Not all test results are available during your visit. If your test results are not back during the visit, make an appointment with your caregiver to find out the results. Do not assume everything is normal if you have not heard from your caregiver or the medical facility. It is important for you to follow up on all of your test  results.  SEEK IMMEDIATE MEDICAL ATTENTION IF: You have more than a spotting of blood in your stool.  Your belly is swollen (abdominal distention).  You are nauseated or vomiting.  You have a temperature over 101.  You have abdominal pain or discomfort that is severe or gets worse throughout the day.      colonoscopy revealed only diverticulosis; repeat colonoscopy in 7 years  Upper endoscopy revealed acid burns in your esophagus from reflux and a stomach ulcer.    No tumor or cancer found biopsies of your stomach taken to check for infection   stop taking any NSAIDs like Aleve Advil  ibuprofen  or headache powders such as Goody's,   BCs or Stanback's.   begin Protonix  40 mg pill twice daily 30 minutes before breakfast and supper.  A new prescription has been called in to your pharmacy from my office.    Further recommendations to follow.    Office visit with Charmaine Melia in  4 to 6 weeks.    At patient request, I called Kiren Mcisaac at [phone] answer

## 2023-11-16 NOTE — Progress Notes (Signed)
 error

## 2023-11-16 NOTE — Op Note (Signed)
 [facility] Patient Name: Blake Solis Procedure Date: 11/16/2023 11:47 AM MRN: [id number] Date of Birth: September 12, 1955 Attending MD: Lamar Ozell Hollingshead , MD, [id number] CSN: [id number] Age: 68 Admit Type: Outpatient Procedure:                Upper GI endoscopy Indications:              Abnormal CT of the GI tract Providers:                Lamar Ozell Hollingshead, MD, Jon LABOR. Gerome RN, RN,                            [place] Wilson, Technician Referring MD:              Medicines:                Propofol  per Anesthesia Complications:            No immediate complications. Estimated Blood Loss:     Estimated blood loss was minimal. Procedure:                Pre-Anesthesia Assessment:                           - Prior to the procedure, a History and Physical                            was performed, and patient medications and                            allergies were reviewed. The patient's tolerance of                            previous anesthesia was also reviewed. The risks                            and benefits of the procedure and the sedation                            options and risks were discussed with the patient.                            All questions were answered, and informed consent                            was obtained. Prior Anticoagulants: The patient has                            taken no anticoagulant or antiplatelet agents. ASA                            Grade Assessment: III - A patient with severe                            systemic disease. After reviewing the risks and  benefits, the patient was deemed in satisfactory                            condition to undergo the procedure.                           After obtaining informed consent, the endoscope was                            passed under direct vision. Throughout the                            procedure, the patient's blood pressure, pulse, and                             oxygen saturations were monitored continuously. The                            [id number] ([id number])Leezm was introduced through the                            mouth, and advanced to the second part of duodenum.                            The upper GI endoscopy was accomplished without                            difficulty. The patient tolerated the procedure                            well. Scope In: 12:05:59 PM Scope Out: 12:13:03 PM Total Procedure Duration: 0 hours 7 minutes 4 seconds  Findings:      Circumferential distal esophageal erosions within 2 cm of the EG       junction single 5 mm ulcer present. No tumor. No Barrett's epithelium       seen. Tubular esophagus patent throughout its course.      Multiple 2 to 3 mm cratered antral erosions in the antrum. Some mucosal       edema present. No infiltrating process the proximal stomach GE junction       was seen well retroflexed. No tumor. Pylorus patent. Examination of the       bulb and second portion revealed bulbar and proximal D2 erosions there       was a single 5 mm cratered ulcer (clean-based) at the junction of D1 and       D2. Gastric biopsies were taken for H. pylori testing. Impression:               - Moderate ulcerative reflux esophagitis.                           - Significant antral gastric erosions.                           - Bulbar erosions with single bulbar ulcer. No  tumor found in the upper GI tract. Gastric biopsies                            taken. Moderate Sedation:      Moderate (conscious) sedation was personally administered by an       anesthesia professional. The following parameters were monitored: oxygen       saturation, heart rate, blood pressure, respiratory rate, EKG, adequacy       of pulmonary ventilation, and response to care. Recommendation:           - Patient has a contact number available for                            emergencies. The signs and symptoms of  potential                            delayed complications were discussed with the                            patient. Return to normal activities tomorrow.                            Written discharge instructions were provided to the                            patient.                           - Advance diet as tolerated. Begin pantoprazole  or                            Protonix  40 mg twice daily. Avoid all NSAIDs.                            Follow-up on pathology. Office visit with us  in 1                            month. See colonoscopy report. Procedure Code(s):        --- Professional ---                           [phone], Esophagogastroduodenoscopy, flexible,                            transoral; diagnostic, including collection of                            specimen(s) by brushing or washing, when performed                            (separate procedure) Diagnosis Code(s):        --- Professional ---                           R93.3, Abnormal findings on diagnostic imaging of  other parts of digestive tract CPT copyright 2022 American [employer] Association. All rights reserved. The codes documented in this report are preliminary and upon coder review may  be revised to meet current compliance requirements. Lamar HERO. Tashina Credit, MD Lamar Ozell Hollingshead, MD 11/16/2023 12:34:23 PM This report has been signed electronically. Number of Addenda: 0

## 2023-11-17 ENCOUNTER — Encounter (HOSPITAL_COMMUNITY): Payer: Self-pay | Admitting: Internal Medicine

## 2023-11-17 LAB — SURGICAL PATHOLOGY

## 2023-11-19 ENCOUNTER — Ambulatory Visit: Payer: Self-pay | Admitting: Internal Medicine

## 2023-12-03 ENCOUNTER — Telehealth: Admitting: Nurse Practitioner

## 2023-12-03 DIAGNOSIS — H6501 Acute serous otitis media, right ear: Secondary | ICD-10-CM

## 2023-12-03 MED ORDER — AMOXICILLIN-POT CLAVULANATE 875-125 MG PO TABS: 1.0000 | ORAL_TABLET | Freq: Two times a day (BID) | ORAL | 0 refills | Status: DC

## 2023-12-03 NOTE — Progress Notes (Signed)
 I have spent 5 minutes in review of e-visit questionnaire, review and updating patient chart, medical decision making and response to patient.   Claiborne Rigg, NP

## 2023-12-03 NOTE — Progress Notes (Signed)

## 2023-12-06 ENCOUNTER — Encounter (INDEPENDENT_AMBULATORY_CARE_PROVIDER_SITE_OTHER): Payer: Self-pay

## 2023-12-15 ENCOUNTER — Ambulatory Visit

## 2023-12-19 ENCOUNTER — Encounter: Payer: Self-pay | Admitting: Pulmonary Disease

## 2023-12-19 ENCOUNTER — Ambulatory Visit (INDEPENDENT_AMBULATORY_CARE_PROVIDER_SITE_OTHER): Admitting: Pulmonary Disease

## 2023-12-19 VITALS — BP 135/71 | HR 61 | Ht 70.0 in | Wt 231.0 lb

## 2023-12-19 DIAGNOSIS — J432 Centrilobular emphysema: Secondary | ICD-10-CM

## 2023-12-19 DIAGNOSIS — F17211 Nicotine dependence, cigarettes, in remission: Secondary | ICD-10-CM

## 2023-12-19 DIAGNOSIS — R911 Solitary pulmonary nodule: Secondary | ICD-10-CM

## 2023-12-19 NOTE — Patient Instructions (Signed)
-   Don't worry about lung nodule. Likely benign. Repeat CT chest in a year. - Get breathing test done and then come back to see me

## 2023-12-19 NOTE — Progress Notes (Signed)
 New Patient Pulmonology Office Visit   Subjective:  Patient ID: Blake Solis, male    DOB: Jul 09, 1955  MRN: [id number]  Referred by: Trudy Vaughn FALCON, MD  CC:  Chief Complaint  Patient presents with   Establish Care    Abn ct by pcp    HPI CHARLENE Solis is a 68 y.o. male with HTN and GERD who presents for initial evaluation due to incidental solitary pulmonary nodule (3 mm LLL).  PCP from [facility] ordered CT C/A/P for unintentional weight loss. Found to have a RLL 3 mm pulmonary nodule. He's had some shortness of breath with exertion and wheezing every once in a while. He uses albuterol MDI. He uses and it helps when he uses. It does not get real bad. He quit smoking in 2000. He started smoking around 15 years and smoked 30 years almost. 1 pack per day. 30 PY. Exertional dyspnea happen with climbing stairs, walking on elevation, carrying smth heavy. Works in Press [employer]. He's a Water quality scientist. Push mower does not bother him. Able to exercise. No cough or phlegm production. No hemoptysis. He lost 20 lbs unintentionally.  Symptoms Associated with Lung cancer:   Central Tumor Sx: Dyspnea and Wheezing  Peripheral Tumor Sx: Dyspnea  Sx of Metastasis: Weight loss   Conditions associated with lung cancer & imp to identify prior to bronch: COPD  No anesthesia reactions Achilles tendon surgery R hip replacement R knee surgery  PMH:  - HTN - GERD  Important Medications:  - No blood thinners  Allergies: none  Social History:  Smoking and [employer] Exposure: Tobacco Smoking 30 PY  Occupational Exposures: Information systems manager Exposures: None  Family History: History of lung cancer: brother with lung cancer and History of other types of cancers: brother with pancreatic cancer  ASA grade:  ASA 2 - Patient with mild systemic disease with no functional limitations  Karnofsky Performance Status: 90 Able to carry on normal activity, minor signs, or symptoms of  disease  ECOG Performance Status: (0) Fully active, able to carry on all predisease performance without restriction  ROS  Allergies: Patient has no known allergies.  Current Outpatient Medications:    amLODipine  (NORVASC ) 10 MG tablet, Take 10 mg by mouth in the morning., Disp: , Rfl:    losartan (COZAAR) 100 MG tablet, Take 100 mg by mouth in the morning. Pt is taking 50mg  (Patient taking differently: Take 50 mg by mouth in the morning. Pt is taking 50mg ), Disp: , Rfl:    pantoprazole  (PROTONIX ) 40 MG tablet, Take 1 tablet (40 mg total) by mouth 2 (two) times daily before a meal., Disp: 60 tablet, Rfl: 3   tadalafil (CIALIS) 5 MG tablet, Take 5 mg by mouth daily as needed., Disp: , Rfl:    amoxicillin -clavulanate (AUGMENTIN ) 875-125 MG tablet, Take 1 tablet by mouth 2 (two) times daily., Disp: 20 tablet, Rfl: 0 Past Medical History:  Diagnosis Date   Arthritis    Bronchitis, allergic    Essential hypertension    On several medications.   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   Wears glasses    Past Surgical History:  Procedure Laterality Date   ACHILLES TENDON SURGERY Right 10/20/2022   Procedure: ACHILLES  TENDON REPAIR;  Surgeon: Dulcie Gretta POUR, DPM;  [facility];  Service: Podiatry;  Laterality: Right;   CARDIAC CATHETERIZATION  04/04/2002   Images reviewed: Normal coronaries   COLONOSCOPY N/A 09/06/2019   Procedure: COLONOSCOPY;  Surgeon: Golda Claudis PENNER, MD;  [facility];  Service: Endoscopy;  Laterality: N/A;  135   COLONOSCOPY N/A 11/16/2023   Procedure: COLONOSCOPY;  Surgeon: Shaaron Lamar HERO, MD;  [facility];  Service: Endoscopy;  Laterality: N/A;  11:00 am, asa 2   CYST EXCISION Right    cyst  removed from arm   ELBOW SURGERY Left    ESOPHAGOGASTRODUODENOSCOPY N/A 11/16/2023   Procedure: EGD (ESOPHAGOGASTRODUODENOSCOPY);  Surgeon: Shaaron Lamar HERO, MD;  [facility];  Service: Endoscopy;  Laterality: N/A;   HIP ARTHROPLASTY Right    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   OSTECTOMY Right 10/20/2022   Procedure: CALCANEAL PARTIAL EXCISION, Flexor digitorum longus tendon transfer, right;  Surgeon: Dulcie Gretta POUR, DPM;  Location: [facility];  Service: Podiatry;  Laterality: Right;   POLYPECTOMY  09/06/2019   Procedure: POLYPECTOMY;  Surgeon: Golda Claudis PENNER, MD;  [facility];  Service: Endoscopy;;   SCROTAL EXPLORATION N/A 01/01/2021   Procedure: SCROTUM EXPLORATION- excision of scrotal sebacous cyst;  Surgeon: Sherrilee Belvie CROME, MD;  Location: [facility];  Service: Urology;  Laterality: N/A;   SCROTAL EXPLORATION N/A 02/08/2022   Procedure: SCROTUM EXPLORATION- excision of cyst;  Surgeon: Sherrilee Belvie CROME, MD;  Location: [facility];  Service: Urology;  Laterality: N/A;   SHOULDER ARTHROSCOPY WITH ROTATOR CUFF REPAIR AND SUBACROMIAL DECOMPRESSION Right 10/14/2017   Procedure: RIGHT SHOULDER ARTHROSCOPY WITH EXTENSIVE DEBRIDEMENT, SUBACROMIAL DECOMPRESSION, DISTAL CLAVICLE EXCISION AND BICEPS TENODYSIS;  Surgeon: Jerri Kay HERO, MD;  Location: [facility];  Service: Orthopedics;  Laterality: Right;   TOTAL HIP ARTHROPLASTY Right 06/07/2012   Procedure: TOTAL HIP ARTHROPLASTY ANTERIOR APPROACH;  Surgeon: Oneil JAYSON Herald, MD;  [facility];  Service: Orthopedics;  Laterality: Right;   Right Total Hip Arthroplasty-Anterior Approach   TRANSTHORACIC ECHOCARDIOGRAM  04/28/2020   [facility]) EF 65 to 70%.  No or WMA.  GR 1 DD.  Mildly thickened aortic valve.  Mild to moderately dilated left atrium.  Normal RV size and function.  Mild RA dilation.:   ULNAR NERVE TRANSPOSITION Left 11/13/2020   Procedure: left elbow ulnar nerve neurolysis, ganglion cyst removal;  Surgeon: Jerri Kay HERO, MD;  Location:  SUR[facility]ERY [facility];  Service: Orthopedics;  Laterality: Left;   Family History  Problem Relation Age of Onset   Colon cancer Brother 51   Neurologic Disorder Mother        Lang Brought - by report   Liver disease Father    Other Sister 70       Natural causes   Pancreatic cancer Brother 71       Unknown   Diabetes Mellitus II Brother        Died from DM-Coma @ 82   Diabetic kidney disease Brother 62  End-stage- s/p Txplant ([place] was the Donor)   Lung cancer Brother 53   Other Sister 5   Healthy Sister        He does not know much details   Arthritis Sister    Diabetes type II Sister    Arthritis Sister    Stomach cancer Sister    Social History   Socioeconomic History   Marital status: Married    Spouse name: Not on file   Number of children: 1   Years of education: Not on file   Highest education level: GED or equivalent  Occupational History   Occupation: Actuary: [employer]    Comment: Now called [employer]  Tobacco Use   Smoking status: Former    Current packs/day: 0.00    Average packs/day: 1 pack/day for 40.0 years (40.0 ttl pk-yrs)    Types: Cigarettes    Start date: 04/22/1958    Quit date: 04/22/1998    Years since quitting: 25.6   Smokeless tobacco: Never  Vaping Use   Vaping status: Never Used  Substance and Sexual Activity   Alcohol use: No   Drug use: No   Sexual activity: Yes  Other Topics Concern   Not on file  Social History Narrative   He is a married father of 1 daughter, grandfather  of 1.     His daughter Amie and grandson lives with him and his wife.      He currently works for [employer] as a Pensions consultant.   Former smoker about 40 pack years, quit in 2000      Does lots of exercise and physical activity and work, but does not do routine exercise otherwise.       Social Drivers of Corporate investment banker Strain: Not on file  Food Insecurity: Not on file  Transportation Needs: Not on file  Physical Activity: Not on file  Stress: Not on file  Social Connections: Unknown (08/12/2022)   Received from [facility]   Social Network    Social Network: Not on file  Intimate Partner Violence: Unknown (08/12/2022)   Received from [facility]   HITS    Physically Hurt: Not on file    Insult or Talk Down To: Not on file    Threaten Physical Harm: Not on file    Scream or Curse: Not on file       Objective:  BP 135/71   Pulse 61   Ht 5' 10 (1.778 m)   Wt 231 lb (104.8 kg)   SpO2 98% Comment: ra  BMI 33.15 kg/m  Wt Readings from Last 3 Encounters:  12/19/23 231 lb (104.8 kg)  11/14/23 232 lb 3.2 oz (105.3 kg)  07/15/23 239 lb (108.4 kg)   BMI Readings from Last 3 Encounters:  12/19/23 33.15 kg/m  11/14/23 33.32 kg/m  07/15/23 33.81 kg/m   SpO2 Readings from Last 3 Encounters:  12/19/23 98%  11/16/23 98%  10/20/22 93%    Physical Exam General: NAD, alert, WD, WN Eyes: PERRL, no scleral icterus ENMT: oropharynx clear, good dentition, no oral lesions, mallampati score IV Skin: warm, intact, no rashes Neck: JVD flat, ROM and lymph node assessment normal LN: axillary, supra-clavicular, and inguinal lymph node assessment CV: RRR, no MRG, nl S1 and S2, no peripheral edema Resp: clear to auscultation bilaterally, no wheezes, rales, or rhonchi, normal effort, no clubbing/cyanosis Abdom: Normoactive bowel sounds, soft, nontender, nondistended, no hepatosplenomegaly Ext: edema Neuro: Awake  alert oriented to person place time and  situation  Diagnostic Review:  Last CBC Lab Results  Component Value Date   WBC 6.2 09/02/2016   HGB 14.6 10/20/2022   HCT 43.0 10/20/2022   MCV 88.9 09/02/2016   MCH 30.2 09/02/2016   RDW 13.0 09/02/2016   PLT 136 (L) 09/02/2016   Last metabolic panel Lab Results  Component Value Date   GLUCOSE 97 10/20/2022   NA 139 10/20/2022   K 4.5 10/20/2022   CL 104 10/20/2022   CO2 23 02/05/2022   BUN 26 (H) 10/20/2022   CREATININE 1.20 10/20/2022   GFRNONAA >60 02/05/2022   CALCIUM 9.2 02/05/2022   PROT 7.4 06/02/2012   ALBUMIN 4.0 06/02/2012   BILITOT 0.4 06/02/2012   ALKPHOS 107 06/02/2012   AST 30 06/02/2012   ALT 21 06/02/2012   ANIONGAP 11 02/05/2022    CT Chest 11/09/2023: LUNGS: A 3 mm left lower lobe pulmonary nodule is seen (3:196), new when compared with the prior exam. No confluent focal consolidation or mass within the lungs is seen. Mild emphysema is seen predominantly within the bilateral upper lobes. The central airway is clear.     Assessment & Plan:   Assessment & Plan Centrilobular emphysema (HCC) Patient with 30 PY smoking hx. MMRC < 2, CAT < 10. No recurrent exacerbations. No prior PFTs on file. Will obtain formal PFT for evaluation. The patient had PFT in [place] in the past, will try to get that as well and compare. CT chest shows upper lobe emphysematous changes. For now, agree with as needed albuterol MDI. Solitary lung nodule 3 mm LLL solitary pulmonary nodule incidentally identified on CT chest ordered for evaluation of weight loss. This is likely benign. Per Fleischner criteria, we would repeat CT chest in a year for high risk patients. He is high risk due to age, prior smoking exposure, as well as family hx of lung cancer. Cigarette nicotine dependence in remission Quit in the year 2000. Would not qualify for lung cancer screening given that he stopped smoking > 15 years ago.  Orders Placed This Encounter  Procedures   CT Chest Wo Contrast    Pulmonary function test   I spent 30 minutes reviewing patient's chart including prior consultant notes, imaging, and PFTs as well as face-to-face with the patient, over half in discussion of the diagnosis and the importance of compliance with the treatment plan.  Return in about 4 months (around 04/19/2024).   Detria Cummings, MD

## 2023-12-21 ENCOUNTER — Ambulatory Visit: Admitting: Pulmonary Disease

## 2023-12-28 ENCOUNTER — Ambulatory Visit: Admitting: Gastroenterology

## 2023-12-28 ENCOUNTER — Encounter: Payer: Self-pay | Admitting: Gastroenterology

## 2023-12-29 ENCOUNTER — Telehealth: Payer: Self-pay | Admitting: *Deleted

## 2023-12-29 NOTE — Telephone Encounter (Signed)
 error

## 2024-01-02 ENCOUNTER — Telehealth: Payer: Self-pay

## 2024-01-02 ENCOUNTER — Encounter (INDEPENDENT_AMBULATORY_CARE_PROVIDER_SITE_OTHER): Payer: Self-pay | Admitting: Physician Assistant

## 2024-01-02 ENCOUNTER — Ambulatory Visit (INDEPENDENT_AMBULATORY_CARE_PROVIDER_SITE_OTHER): Admitting: Physician Assistant

## 2024-01-02 VITALS — BP 130/73 | HR 50 | Temp 97.5°F | Ht 70.0 in | Wt 228.0 lb

## 2024-01-02 DIAGNOSIS — H9191 Unspecified hearing loss, right ear: Secondary | ICD-10-CM

## 2024-01-02 MED ORDER — LORATADINE 10 MG PO TABS: 10.0000 mg | ORAL_TABLET | Freq: Every day | ORAL | 11 refills | Status: AC

## 2024-01-02 MED ORDER — FLUTICASONE PROPIONATE 50 MCG/ACT NA SUSP: 2.0000 | Freq: Every day | NASAL | 6 refills | Status: AC

## 2024-01-02 NOTE — Progress Notes (Signed)
 Dear Dr. Trudy, Here is my assessment for our mutual patient, Blake Solis. Thank you for allowing me the opportunity to care for your patient. Please do not hesitate to contact me should you have any other questions. Sincerely, Chyrl Cohen PA-C  Otolaryngology Clinic Note Referring provider: Dr. Trudy HPI:  Blake Solis is a 68 y.o. male kindly referred by Dr. Trudy   The patient is a 68 year old gentleman seen in our office for evaluation of ear pressure.  He notes over the last several years he has had intermittent right ear pressure, muffled hearing which has come and gone.  He notes some pain in the right ear when symptoms present, no left ear symptoms.  He denies any associated dizziness, no ringing.  He notes he has seen his primary care provider for this, he was placed on prednisone  which he reported helped his symptoms temporarily.  He denies any history of seasonal allergies.  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  As part of his job he has hearing test which are distantly reported as decreased hearing on the right when compared to left.  Today's visit he notes some minor pressure in the right ear with some muffled hearing on the right.  He denies any history of preceding recurrent ear infections.  He notes he has had 2 ear infections as an adult, no significant history as a child.  No trauma to the ears.     Independent Review of Additional Tests or Records:  None   PMH/Meds/All/SocHx/FamHx/ROS:   Past Medical History:  Diagnosis Date   Arthritis    Bronchitis, allergic    Essential hypertension    On several medications.   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   Wears glasses      Past Surgical History:  Procedure Laterality Date   ACHILLES TENDON SURGERY Right 10/20/2022   Procedure: ACHILLES TENDON REPAIR;  Surgeon: Dulcie Gretta POUR, DPM;  Location: [facility];  Service: Podiatry;  Laterality: Right;   CARDIAC CATHETERIZATION  04/04/2002   Images reviewed: Normal coronaries   COLONOSCOPY N/A 09/06/2019   Procedure: COLONOSCOPY;  Surgeon: Golda Claudis PENNER, MD;  [facility];  Service: Endoscopy;  Laterality: N/A;  135   COLONOSCOPY N/A 11/16/2023   Procedure: COLONOSCOPY;  Surgeon: Shaaron Lamar HERO, MD;  [facility];  Service: Endoscopy;  Laterality: N/A;  11:00 am, asa 2   CYST EXCISION Right    cyst removed from arm   ELBOW SURGERY Left    ESOPHAGOGASTRODUODENOSCOPY N/A 11/16/2023   Procedure: EGD (ESOPHAGOGASTRODUODENOSCOPY);  Surgeon: Shaaron Lamar HERO, MD;  [facility];  Service: Endoscopy;  Laterality: N/A;   HIP ARTHROPLASTY Right    KIDNEY DONATION Left    Was donor for his brother   KNEE ARTHROSCOPY Right 2013   lft ulnar nerve removed     decompression   MASS EXCISION Left 09/14/2021   Procedure: EXCISION SOFT TISSUE MASS LEFT FOOT;  Surgeon: Dulcie Gretta POUR, DPM;  [facility];  Service: Podiatry;  Laterality: Left;  MAC WITH LOCAL   NEPHRECTOMY Left    left, donated to  his brother, EF 55-60%...   OSTECTOMY Right 10/20/2022   Procedure: CALCANEAL PARTIAL EXCISION, Flexor digitorum longus tendon transfer, right;  Surgeon: Dulcie Gretta POUR, DPM;  Location: [facility];  Service: Podiatry;  Laterality: Right;   POLYPECTOMY  09/06/2019   Procedure: POLYPECTOMY;  Surgeon: Golda Claudis PENNER, MD;  [facility];  Service: Endoscopy;;   SCROTAL EXPLORATION N/A 01/01/2021   Procedure: SCROTUM EXPLORATION- excision of scrotal sebacous cyst;  Surgeon: Sherrilee Belvie CROME, MD;  Location: [facility];  Service: Urology;  Laterality: N/A;    SCROTAL EXPLORATION N/A 02/08/2022   Procedure: SCROTUM EXPLORATION- excision of cyst;  Surgeon: Sherrilee Belvie CROME, MD;  Location[facility];  Service: Urology;  Laterality: N/A;   SHOULDER ARTHROSCOPY WITH ROTATOR CUFF REPAIR AND SUBACROMIAL DECOMPRESSION Right 10/14/2017   Procedure: RIGHT SHOULDER ARTHROSCOPY WITH EXTENSIVE DEBRIDEMENT, SUBACROMIAL DECOMPRESSION, DISTAL CLAVICLE EXCISION AND BICEPS TENODYSIS;  Surgeon: Jerri Kay HERO, MD;  Location: [facility];  Service: Orthopedics;  Laterality: Right;   TOTAL HIP ARTHROPLASTY Right 06/07/2012   Procedure: TOTAL HIP ARTHROPLASTY ANTERIOR APPROACH;  Surgeon: Oneil JAYSON Herald, MD;  [facility];  Service: Orthopedics;  Laterality: Right;  Right Total Hip Arthroplasty-Anterior Approach   TRANSTHORACIC ECHOCARDIOGRAM  04/28/2020   [facility]) EF 65 to 70%.  No or WMA.  GR 1 DD.  Mildly thickened aortic valve.  Mild to moderately dilated left atrium.  Normal RV size and function.  Mild RA dilation.:   ULNAR NERVE TRANSPOSITION Left 11/13/2020   Procedure: left elbow ulnar nerve neurolysis, ganglion cyst removal;  Surgeon: Jerri Kay HERO, MD;  Location: [facility];  Service: Orthopedics;  Laterality: Left;    Family History  Problem Relation Age of Onset   Colon cancer Brother 15   Neurologic Disorder Mother        Lang Brought - by report   Liver disease Father    Other Sister 39       Natural causes   Pancreatic cancer Brother 75       Unknown   Diabetes Mellitus II Brother        Died from DM-Coma @ 6   Diabetic kidney disease Brother 18       End-stage- s/p Txplant ([place] was the Donor)   Lung cancer Brother 57   Other Sister 67   Healthy Sister        He does not know much details   Arthritis Sister    Diabetes type II Sister    Arthritis Sister    Stomach cancer Sister      Social Connections: Unknown (08/12/2022)   Received from [employer]   Social Network    Social Network: Not on  file      Current Outpatient Medications:    amLODipine  (NORVASC ) 10 MG tablet, Take 10 mg by mouth in the morning., Disp: , Rfl:    losartan (COZAAR) 100 MG tablet, Take 100 mg by mouth in the morning. Pt is taking 50mg  (Patient taking differently: Take 50 mg by mouth in the morning. Pt is taking 50mg ), Disp: , Rfl:    pantoprazole  (PROTONIX ) 40 MG tablet, Take 1 tablet (40 mg total) by mouth 2 (two) times daily before a meal., Disp: 60 tablet, Rfl: 3   tadalafil (CIALIS) 5 MG tablet, Take 5 mg by mouth daily as needed., Disp: , Rfl:    Physical Exam:   BP 130/73 (BP Location: Right Arm, Patient Position: Sitting, Cuff  Size: Normal)   Pulse (!) 50   Temp (!) 97.5 F (36.4 C) (Oral)   Ht 5' 10 (1.778 m)   Wt 228 lb (103.4 kg)   SpO2 96%   BMI 32.71 kg/m   Pertinent Findings  CN II-XII intact Bilateral EAC clear and TM intact with well pneumatized middle ear space Anterior rhinoscopy: Septum midline; bilateral inferior turbinates with no hypertrophy No lesions of oral cavity/oropharynx; dentition within normal limits No obviously palpable neck masses/lymphadenopathy/thyromegaly No respiratory distress or stridor  Seprately Identifiable Procedures:  None  Impression & Plans:  Blake Solis is a 67 y.o. male with the following   Decreased hearing-  Right sided decreased hearing.  Symptoms seem to come and go, question eustachian tube dysfunction.  Overall reassuring exam today.  I would like audiological evaluation for further evaluation management.  Once he has this completed like to see him back in the office to discuss the results.  In the meantime I would recommend Flonase, daily Claritin, saline irrigation.   - f/u office visit with audiological evaluation   Thank you for allowing me the opportunity to care for your patient. Please do not hesitate to contact me should you have any other questions.  Sincerely, Chyrl Cohen PA-C [facility] Phone:  [phone] Fax: [phone]  01/02/2024, 9:43 AM

## 2024-01-02 NOTE — Telephone Encounter (Signed)
 Return call to pt. Pt wants to cancel appt and reschedule at a later time.

## 2024-01-06 ENCOUNTER — Ambulatory Visit: Admitting: Urology

## 2024-01-16 ENCOUNTER — Telehealth: Payer: Self-pay | Admitting: Orthopedic Surgery

## 2024-01-16 NOTE — Telephone Encounter (Signed)
 This patient scheduled himself an appointment for 01/30/24 for RLE pain.  I called him to find out that Maralee China did surgery on his rt knee in 2022.  Please review in Epic and advise.  [phone]

## 2024-01-17 ENCOUNTER — Ambulatory Visit: Admitting: Pulmonary Disease

## 2024-01-17 NOTE — Telephone Encounter (Signed)
 Called the pt and advised ok to keep his appointment

## 2024-01-19 ENCOUNTER — Telehealth: Payer: Self-pay | Admitting: Pulmonary Disease

## 2024-01-19 NOTE — Telephone Encounter (Signed)
 Copied from CRM [id number]. Topic: Appointments - Scheduling Inquiry for Clinic >> Jan 18, 2024  2:53 PM Isabell A wrote: Reason for CRM: Patient calling to reschedule PFT scheduled at [facility] on 11/4.  Callback number: [phone]  Called and spoke with patient and informed him if he reschedules the PFT for [facility]--the next opening is not until January 2026.  He stated to keep the appointment as it is

## 2024-01-20 ENCOUNTER — Encounter: Payer: Self-pay | Admitting: Physician Assistant

## 2024-01-20 ENCOUNTER — Other Ambulatory Visit (INDEPENDENT_AMBULATORY_CARE_PROVIDER_SITE_OTHER): Payer: Self-pay

## 2024-01-20 ENCOUNTER — Ambulatory Visit (INDEPENDENT_AMBULATORY_CARE_PROVIDER_SITE_OTHER): Admitting: Physician Assistant

## 2024-01-20 DIAGNOSIS — G8929 Other chronic pain: Secondary | ICD-10-CM

## 2024-01-20 DIAGNOSIS — Z96641 Presence of right artificial hip joint: Secondary | ICD-10-CM

## 2024-01-20 DIAGNOSIS — M25561 Pain in right knee: Secondary | ICD-10-CM

## 2024-01-20 MED ORDER — LIDOCAINE HCL 1 % IJ SOLN
4.0000 mL | INTRAMUSCULAR | Status: AC | PRN
  Administered 2024-01-20: 4 mL

## 2024-01-20 MED ORDER — METHYLPREDNISOLONE ACETATE 40 MG/ML IJ SUSP
40.0000 mg | INTRAMUSCULAR | Status: AC | PRN
  Administered 2024-01-20: 40 mg via INTRA_ARTICULAR

## 2024-01-20 NOTE — Progress Notes (Signed)
 Office Visit Note   Patient: Blake Solis           Date of Birth: 08-19-1955           MRN: [id number] Visit Date: 01/20/2024              Requested by: Blake Vaughn FALCON, MD [address] [place],  [place] [address] PCP: Blake Vaughn FALCON, MD   Assessment & Plan: Visit Diagnoses:  1. Chronic pain of right knee   2. History of right hip replacement     Plan: Pleasant 68 year old gentleman who is status post knee arthroscopy and what he describes as a meniscus repair in 2022 with Blake Solis.  Also regular patient of Blake Solis.  Comes in complaining of locking of his right knee.  No injury.  He is still active and working.  Denies any catching or any instability.  He did have a history of a meniscus surgery with Blake Solis.  I think this may be more degeneration of the meniscus.  However given the amount of arthritis he has in his knee I would like to try an injection.  Given about 5 days of the knee a call if he still has problems we could consider an MRI but I did told him most likely that if he had continued problems in the knee he would probably be favored as a knee replacement over another arthroscopy  Follow-Up Instructions: Return if symptoms worsen or fail to improve.   Orders:  Orders Placed This Encounter  Procedures   XR Knee 1-2 Views Right   XR HIP UNILAT W OR W/O PELVIS 1V RIGHT   No orders of the defined types were placed in this encounter.     Procedures: Large Joint Inj on 01/20/2024 9:58 AM Indications: pain and diagnostic evaluation Details: 25 G 1.5 in needle, anterolateral approach  Arthrogram: No  Medications: 40 mg methylPREDNISolone  acetate 40 MG/ML; 4 mL lidocaine  1 % Outcome: tolerated well, no immediate complications Procedure, treatment alternatives, risks and benefits explained, specific risks discussed. Consent was given by the patient.       Clinical Data: No additional findings.   Subjective: Chief Complaint  Patient presents with    Right Knee - Pain   Right Hip - Follow-up    HPI pleasant 68 year old gentleman with a history of right knee meniscus surgery with Blake Solis a few years ago.  Was doing well till a couple weeks ago and started having locking of his right knee.  Denies any injury fever or chills.  Also has status post right total hip replacement was concerned somewhat about his hip although has not had any pain in his groin and has had no problems with the hip he is just was worried because it was the same side  Review of Systems  All other systems reviewed and are negative.    Objective: Vital Signs: There were no vitals taken for this visit.  Physical Exam Constitutional:      Appearance: Normal appearance.  Pulmonary:     Effort: Pulmonary effort is normal.  Skin:    General: Skin is warm and dry.  Neurological:     General: No focal deficit present.     Mental Status: He is alert and oriented to person, place, and time.  Psychiatric:        Mood and Affect: Mood normal.        Behavior: Behavior normal.     Ortho Exam  Specialty Comments:  MRI of the lumbar spine from 07/23/2023 was independently reviewed and interpreted, showing right-sided foraminal stenosis at L4/5.  No other significant stenosis seen.  Facet arthropathy at L4/5 and L5/S1.  Small disc herniation with annular tear at L4/5.  DDD at L3/4 and L4/5.  Imaging: XR Knee 1-2 Views Right Result Date: 01/20/2024 Degenerative changes with sclerotic changes and periarticular osteophytes the more is noted in the left rather than the right knee on AP view  XR HIP UNILAT W OR W/O PELVIS 1V RIGHT Result Date: 01/20/2024 Status post total hip replacement hip is well-seated no evidence of dislocation or fracture    PMFS History: Patient Active Problem List   Diagnosis Date Noted   Pain in left hip 02/23/2023   Symptomatic bradycardia 12/04/2021   Scrotal sebaceous cyst 12/24/2020   Ganglion of left elbow 10/28/2020   Chest pain  with high risk for cardiac etiology 04/29/2020   Renal cyst 01/21/2020   Benign prostatic hyperplasia with urinary obstruction 01/21/2020   Nocturia 01/21/2020   S/P arthroscopy of right shoulder 11/01/2017   Superior glenoid labrum lesion of right shoulder    Arthrosis of right acromioclavicular joint    Tendinopathy of rotator cuff, right    Impingement syndrome of right shoulder    Nontraumatic tear of right supraspinatus tendon 09/29/2017   Cubital tunnel syndrome on left 05/13/2017   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   Wears glasses     Family History  Problem Relation Age of Onset   Colon cancer Brother 24   Neurologic Disorder Mother        Lang Brought - by report   Liver disease Father    Other Sister 54       Natural causes   Pancreatic cancer Brother 76       Unknown   Diabetes Mellitus II Brother        Died from DM-Coma @ 25   Diabetic kidney disease Brother 52       End-stage- s/p Txplant ([place] was the Donor)   Lung cancer Brother 51   Other Sister 31   Healthy Sister        He does not know much details   Arthritis Sister    Diabetes type II Sister    Arthritis Sister    Stomach cancer Sister     Past Surgical History:  Procedure Laterality Date   ACHILLES TENDON  SURGERY Right 10/20/2022   Procedure: ACHILLES TENDON REPAIR;  Surgeon: Dulcie Gretta POUR, DPM;  Location: [facility];  Service: Podiatry;  Laterality: Right;   CARDIAC CATHETERIZATION  04/04/2002   Images reviewed: Normal coronaries   COLONOSCOPY N/A 09/06/2019   Procedure: COLONOSCOPY;  Surgeon: Golda Claudis PENNER, MD;  [facility];  Service: Endoscopy;  Laterality: N/A;  135   COLONOSCOPY N/A 11/16/2023   Procedure: COLONOSCOPY;  Surgeon: Shaaron Lamar HERO, MD;  [facility];  Service: Endoscopy;  Laterality: N/A;  11:00 am, asa 2   CYST EXCISION Right    cyst removed from arm   ELBOW SURGERY Left    ESOPHAGOGASTRODUODENOSCOPY N/A 11/16/2023   Procedure: EGD (ESOPHAGOGASTRODUODENOSCOPY);  Surgeon: Shaaron Lamar HERO, MD;  [facility];  Service: Endoscopy;  Laterality: N/A;   HIP ARTHROPLASTY Right    KIDNEY DONATION Left    Was donor for his brother   KNEE ARTHROSCOPY Right 2013   lft ulnar nerve removed     decompression   MASS EXCISION Left 09/14/2021   Procedure: EXCISION SOFT TISSUE MASS LEFT FOOT;  Surgeon: Dulcie Gretta POUR, DPM;  Location: [facility];  Service: Podiatry;  Laterality: Left;  MAC WITH LOCAL   NEPHRECTOMY Left    left, donated to his brother, EF 55-60%...   OSTECTOMY Right 10/20/2022   Procedure: CALCANEAL PARTIAL EXCISION, Flexor digitorum longus tendon transfer, right;  Surgeon: Dulcie Gretta POUR, DPM;  Location: [facility];  Service: Podiatry;  Laterality: Right;   POLYPECTOMY  09/06/2019   Procedure: POLYPECTOMY;  Surgeon: Golda Claudis PENNER, MD;  [facility];  Service: Endoscopy;;   SCROTAL EXPLORATION N/A 01/01/2021   Procedure: SCROTUM EXPLORATION- excision of scrotal sebacous cyst;  Surgeon: Sherrilee Belvie CROME, MD;  [facility]: [facility];  Service: Urology;  Laterality: N/A;   SCROTAL EXPLORATION N/A 02/08/2022   Procedure: SCROTUM EXPLORATION- excision of cyst;  Surgeon:  Sherrilee Belvie CROME, MD;  [facility];  Service: Urology;  Laterality: N/A;   SHOULDER ARTHROSCOPY WITH ROTATOR CUFF REPAIR AND SUBACROMIAL DECOMPRESSION Right 10/14/2017   Procedure: RIGHT SHOULDER ARTHROSCOPY WITH EXTENSIVE DEBRIDEMENT, SUBACROMIAL DECOMPRESSION, DISTAL CLAVICLE EXCISION AND BICEPS TENODYSIS;  Surgeon: Solis Kay HERO, MD;  Location: [facility];  Service: Orthopedics;  Laterality: Right;   TOTAL HIP ARTHROPLASTY Right 06/07/2012   Procedure: TOTAL HIP ARTHROPLASTY ANTERIOR APPROACH;  Surgeon: Oneil JAYSON Herald, MD;  [facility];  Service: Orthopedics;  Laterality: Right;  Right Total Hip Arthroplasty-Anterior Approach   TRANSTHORACIC ECHOCARDIOGRAM  04/28/2020   [facility]) EF 65 to 70%.  No or WMA.  GR 1 DD.  Mildly thickened aortic valve.  Mild to moderately dilated left atrium.  Normal RV size and function.  Mild RA dilation.:   ULNAR NERVE TRANSPOSITION Left 11/13/2020   Procedure: left elbow ulnar nerve neurolysis, ganglion cyst removal;  Surgeon: Solis Kay HERO, MD;  Location: [facility];  Service: Orthopedics;  Laterality: Left;   Social History   Occupational History   Occupation: Actuary: [employer]    Comment: Now called [employer]  Tobacco Use   Smoking status: Former    Current packs/day: 0.00    Average packs/day: 1 pack/day for 40.0 years (40.0 ttl pk-yrs)    Types: Cigarettes    Start date: 04/22/1958    Quit date: 04/22/1998    Years since quitting: 25.7   Smokeless tobacco: Never  Vaping Use   Vaping status: Never Used  Substance and Sexual Activity   Alcohol use: No   Drug use: No   Sexual activity: Yes

## 2024-01-23 ENCOUNTER — Encounter: Payer: Self-pay | Admitting: Radiology

## 2024-01-24 ENCOUNTER — Ambulatory Visit (HOSPITAL_COMMUNITY)
Admission: RE | Admit: 2024-01-24 | Discharge: 2024-01-24 | Disposition: A | Discharge status: Home / Self Care | Source: Ambulatory Visit | Attending: Pulmonary Disease | Admitting: Pulmonary Disease

## 2024-01-24 DIAGNOSIS — J432 Centrilobular emphysema: Secondary | ICD-10-CM | POA: Insufficient documentation

## 2024-01-24 LAB — PULMONARY FUNCTION TEST
DL/VA % pred: 124 %
DL/VA: 5.02 ml/min/mmHg/L
DLCO unc % pred: 98 %
DLCO unc: 25.68 ml/min/mmHg
FEF 25-75 Pre: 1.91 L/s
FEF2575-%Pred-Pre: 74 %
FEV1-%Pred-Pre: 74 %
FEV1-Pre: 2.49 L
FEV1FVC-%Pred-Pre: 100 %
FEV6-%Pred-Pre: 76 %
FEV6-Pre: 3.27 L
FEV6FVC-%Pred-Pre: 103 %
FVC-%Pred-Pre: 74 %
FVC-Pre: 3.33 L
Pre FEV1/FVC ratio: 75 %
Pre FEV6/FVC Ratio: 98 %
RV % pred: 139 %
RV: 3.35 L
TLC % pred: 95 %
TLC: 6.7 L

## 2024-01-25 ENCOUNTER — Ambulatory Visit: Admitting: Orthopaedic Surgery

## 2024-01-30 ENCOUNTER — Ambulatory Visit: Admitting: Orthopedic Surgery

## 2024-01-31 ENCOUNTER — Ambulatory Visit: Payer: Self-pay | Admitting: Pulmonary Disease

## 2024-01-31 DIAGNOSIS — J449 Chronic obstructive pulmonary disease, unspecified: Secondary | ICD-10-CM

## 2024-01-31 MED ORDER — ALBUTEROL SULFATE HFA 108 (90 BASE) MCG/ACT IN AERS: 2.0000 | INHALATION_SPRAY | Freq: Four times a day (QID) | RESPIRATORY_TRACT | 6 refills | Status: AC | PRN

## 2024-01-31 NOTE — Telephone Encounter (Signed)
 Spoke with patient and scheduled follow up with Dr. Belma 04/30/24 at 9:15 am.  Will mail information to patient and he voiced his understanding

## 2024-02-06 ENCOUNTER — Encounter (INDEPENDENT_AMBULATORY_CARE_PROVIDER_SITE_OTHER): Payer: Self-pay | Admitting: Physician Assistant

## 2024-02-06 ENCOUNTER — Ambulatory Visit: Admitting: Gastroenterology

## 2024-02-06 ENCOUNTER — Ambulatory Visit (INDEPENDENT_AMBULATORY_CARE_PROVIDER_SITE_OTHER): Admitting: Physician Assistant

## 2024-02-06 ENCOUNTER — Ambulatory Visit (INDEPENDENT_AMBULATORY_CARE_PROVIDER_SITE_OTHER): Admitting: Audiology

## 2024-02-06 ENCOUNTER — Other Ambulatory Visit (INDEPENDENT_AMBULATORY_CARE_PROVIDER_SITE_OTHER): Payer: Self-pay | Admitting: Physician Assistant

## 2024-02-06 VITALS — BP 158/81 | HR 47

## 2024-02-06 DIAGNOSIS — H9190 Unspecified hearing loss, unspecified ear: Secondary | ICD-10-CM | POA: Diagnosis not present

## 2024-02-06 DIAGNOSIS — H6991 Unspecified Eustachian tube disorder, right ear: Secondary | ICD-10-CM

## 2024-02-06 DIAGNOSIS — H903 Sensorineural hearing loss, bilateral: Secondary | ICD-10-CM

## 2024-02-06 DIAGNOSIS — H918X9 Other specified hearing loss, unspecified ear: Secondary | ICD-10-CM | POA: Diagnosis not present

## 2024-02-06 NOTE — Progress Notes (Signed)
 Dear Dr. Trudy, Here is my assessment for our mutual patient, Blake Solis. Thank you for allowing me the opportunity to care for your patient. Please do not hesitate to contact me should you have any other questions. Sincerely, Blake Cohen PA-C  Otolaryngology Clinic Note Referring provider: Dr. Trudy HPI:  Blake Solis is a 68 y.o. male kindly referred by Dr. Trudy   Discussed the use of AI scribe software for clinical note transcription with the patient, who gave verbal consent to proceed.  History of Present Illness    Blake Solis is a 68 year old male who presents with intermittent right ear fullness and hearing loss.  Patient was last seen in the office on 01/02/2024 below is recap of encounter.  The patient is a 68 year old gentleman seen in our office for evaluation of ear pressure.  He notes over the last several years he has had intermittent right ear pressure, muffled hearing which has come and gone.  He notes some pain in the right ear when symptoms present, no left ear symptoms.  He denies any associated dizziness, no ringing.  He notes he has seen his primary care provider for this, he was placed on prednisone  which he reported helped his symptoms temporarily.  He denies any history of seasonal allergies.  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  As part of his job he has hearing test which are distantly reported as decreased hearing on the right when compared to left.  Today's visit he notes some minor pressure in the right ear with some muffled hearing on the right.  He denies any history of preceding recurrent ear infections.  He notes he has had 2 ear infections as an adult, no significant history as a child.  No trauma to the ears.     Update 02/06/2024  He experiences intermittent fullness in his right ear, accompanied by difficulty hearing. These symptoms occur throughout the  day and can persist for two to three days before resolving spontaneously. He describes the sensation as 'more pressure on that side' and notes a dry feeling.  He notes his symptoms are present today during his hearing evaluation. His wife and daughter have noticed his hearing difficulties, although he personally does not feel he has trouble hearing in general. He does not feel he has general hearing difficulties, although his family reports otherwise.  He has been using a nasal spray and antihistamines for about a month, which may have provided some relief.           Independent Review of Additional Tests or Records:  Audiological evaluation 01/06/2024   Otoscopy: Right ear: Clear external ear canal and notable landmarks visualized on the tympanic membrane. Left ear:  Clear external ear canal and notable landmarks visualized on the tympanic membrane.   Tympanometry: Right ear: Normal external ear canal volume with normal middle ear pressure and tympanic membrane compliance (Type A). Findings are suggestive of normal middle ear function. Left ear: Normal external ear canal volume with normal middle ear pressure and tympanic membrane compliance (Type A). Findings are suggestive of normal middle ear function.   Hearing Evaluation The hearing test results were completed under headphones and re-checked with inserts and results are deemed to be of good reliability. Test technique:  conventional     Pure tone Audiometry: Right ear- Normal hearing from [phone] Hz, then mild to profound sensorineural hearing loss  from 1500 Hz - 8000 Hz. Left ear-  Normal hearing from [phone] Hz, then mild to profound sensorineural hearing loss from 1500 Hz - 8000 Hz.   Speech Audiometry: Right ear- Speech Reception Threshold (SRT) was obtained at 15 dBHL. Left ear-Speech Reception Threshold (SRT) was obtained at 15 dBHL.   Word Recognition Score Tested using NU-6 (recorded) Right ear: 88% was obtained at  a presentation level of 90 dBHL with contralateral masking which is deemed as  good . Left ear: 92% was obtained at a presentation level of 90 dBHL with contralateral masking which is deemed as  excellent.   Impression: There is not a significant difference in pure-tone thresholds between ears. There is not a significant difference in the word recognition score in between ears.     PMH/Meds/All/SocHx/FamHx/ROS:   Past Medical History:  Diagnosis Date   Arthritis    Bronchitis, allergic    Essential hypertension    On several medications.   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   Wears glasses      Past Surgical History:  Procedure Laterality Date   ACHILLES TENDON SURGERY Right 10/20/2022   Procedure: ACHILLES TENDON REPAIR;  Surgeon: Dulcie Gretta POUR, DPM;  Location: [facility];  Service: Podiatry;  Laterality: Right;   CARDIAC CATHETERIZATION  04/04/2002   Images reviewed: Normal coronaries   COLONOSCOPY N/A 09/06/2019   Procedure: COLONOSCOPY;  Surgeon: Golda Claudis PENNER, MD;  [facility];  Service: Endoscopy;  Laterality: N/A;  135   COLONOSCOPY N/A 11/16/2023   Procedure: COLONOSCOPY;  Surgeon: Shaaron Lamar HERO, MD;  [facility];  Service: Endoscopy;  Laterality: N/A;  11:00 am, asa 2   CYST EXCISION Right    cyst removed from arm   ELBOW SURGERY Left    ESOPHAGOGASTRODUODENOSCOPY N/A 11/16/2023   Procedure: EGD (ESOPHAGOGASTRODUODENOSCOPY);  Surgeon: Shaaron Lamar HERO, MD;  [facility];  Service: Endoscopy;  Laterality: N/A;   HIP ARTHROPLASTY Right    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   OSTECTOMY Right 10/20/2022   Procedure: CALCANEAL PARTIAL EXCISION, Flexor digitorum longus tendon transfer, right;  Surgeon: Dulcie Gretta POUR, DPM;  Location: [facility];  Service: Podiatry;  Laterality: Right;   POLYPECTOMY  09/06/2019   Procedure: POLYPECTOMY;  Surgeon: Golda Claudis PENNER, MD;  [facility];  Service: Endoscopy;;   SCROTAL EXPLORATION N/A 01/01/2021   Procedure: SCROTUM EXPLORATION- excision of scrotal sebacous cyst;  Surgeon: Sherrilee Belvie CROME, MD;  [facility];  Service: Urology;  Laterality: N/A;   SCROTAL EXPLORATION N/A 02/08/2022   Procedure: SCROTUM EXPLORATION- excision of cyst;  Surgeon: Sherrilee Belvie CROME, MD;  [facility];  Service: Urology;  Laterality: N/A;   SHOULDER ARTHROSCOPY WITH ROTATOR CUFF REPAIR AND SUBACROMIAL DECOMPRESSION Right 10/14/2017  Procedure: RIGHT SHOULDER ARTHROSCOPY WITH EXTENSIVE DEBRIDEMENT, SUBACROMIAL DECOMPRESSION, DISTAL CLAVICLE EXCISION AND BICEPS TENODYSIS;  Surgeon: Jerri Kay HERO, MD;  Location: [facility];  Service: Orthopedics;  Laterality: Right;   TOTAL HIP ARTHROPLASTY Right 06/07/2012   Procedure: TOTAL HIP ARTHROPLASTY ANTERIOR APPROACH;  Surgeon: Oneil JAYSON Herald, MD;  [facility];  Service: Orthopedics;  Laterality: Right;  Right Total Hip Arthroplasty-Anterior Approach   TRANSTHORACIC ECHOCARDIOGRAM  04/28/2020   [facility]) EF 65 to 70%.  No or WMA.  GR 1 DD.  Mildly thickened aortic valve.  Mild to moderately dilated left atrium.  Normal RV size and function.  Mild RA dilation.:   ULNAR NERVE TRANSPOSITION Left 11/13/2020   Procedure: left elbow ulnar nerve neurolysis, ganglion cyst removal;  Surgeon: Jerri Kay HERO, MD;  Location: [facility];  Service: Orthopedics;  Laterality: Left;    Family History  Problem Relation Age of Onset   Colon cancer Brother 60   Neurologic Disorder Mother        Lang Brought - by report   Liver disease Father    Other Sister 23       Natural causes   Pancreatic cancer Brother 45       Unknown   Diabetes Mellitus II Brother        Died from DM-Coma @ 92   Diabetic kidney disease Brother 36       End-stage- s/p Txplant ([place] was the Donor)   Lung cancer Brother 28   Other Sister 45   Healthy Sister        He does not know much details   Arthritis Sister    Diabetes type II Sister    Arthritis Sister    Stomach cancer Sister      Social Connections: Not on file      Current Outpatient Medications:    albuterol (VENTOLIN HFA) 108 (90 Base) MCG/ACT inhaler, Inhale 2 puffs into the lungs every 6 (six) hours as needed for wheezing or shortness of breath., Disp: 8 g, Rfl: 6   amLODipine  (NORVASC ) 10 MG tablet, Take 10 mg by mouth in the morning., Disp: , Rfl:    fluticasone (FLONASE) 50 MCG/ACT nasal spray, Place 2 sprays into both nostrils daily., Disp: 16 g, Rfl: 6   loratadine (CLARITIN) 10 MG tablet, Take 1 tablet (10 mg total) by mouth daily., Disp: 30 tablet, Rfl: 11   losartan (COZAAR) 100 MG tablet, Take 100 mg by mouth in the morning. Pt is taking 50mg  (Patient taking differently: Take 50 mg by mouth in the morning. Pt is taking 50mg ), Disp: , Rfl:    pantoprazole  (PROTONIX ) 40 MG tablet, Take 1 tablet (40 mg total) by mouth 2 (two) times daily before a meal., Disp: 60 tablet, Rfl: 3   tadalafil (CIALIS) 5 MG tablet, Take 5 mg by mouth daily as needed., Disp: , Rfl:    Physical Exam:   BP (!) 158/81   Pulse (!) 47   SpO2 95%   Pertinent Findings  CN II-XII intact Bilateral EAC clear and TM intact with well pneumatized middle ear space Anterior rhinoscopy: Septum midline; bilateral inferior turbinates with no  hypertrophy No lesions of oral cavity/oropharynx; dentition within normal limits No obviously palpable neck masses/lymphadenopathy/thyromegaly No respiratory distress or stridor      Seprately Identifiable Procedures:  None  Impression & Plans:  Blake Solis is a 68 y.o. male with the following   Assessment and Plan  Hearing loss Significant high-frequency hearing loss confirmed. Family reports difficulty, though he does not perceive it. - Recommend evaluation for hearing aids  Eustachian tube dysfunction Intermittent fullness and pressure in right ear likely due to eustachian tube dysfunction. Current treatment may have provided some relief. - Continue nasal spray and antihistamines for two more months. - If symptoms improve, extend treatment for three to six months. - If symptoms persist, consider follow-up consultation.           - f/u.  PRN   Thank you for allowing me the opportunity to care for your patient. Please do not hesitate to contact me should you have any other questions.  Sincerely, Blake Cohen PA-C [facility] Phone: [phone] Fax: [phone]  02/06/2024, 3:27 PM

## 2024-02-06 NOTE — Progress Notes (Signed)
 [address] [place], [place] [address] [phone]  Audiological Evaluation    Name: UNDREA SHIPES     DOB:   06/26/1955      MRN:   [id number]                                                                                     Service Date: 02/06/2024     Accompanied by: unaccompanied   Patient comes today after Reyes Cohen, PA-C sent a referral for a hearing evaluation due to concerns with hearing loss.   Symptoms Yes Details  Hearing loss  [x]  Reports it is worse int he right ear - reports right ear problems have been there for 20 years or more  Tinnitus  [x]  Right ear  Ear pain/ infections/pressure  [x]  Sometimes pain/pressure in the right ear  Balance problems  [x]  Reports sometimes off balance/ spinning maybe when goes up the ladder, unclear etiology, triggers and /or accompanying symptoms and possible duration is minutes  Noise exposure history  [x]  Reports wears hearing protection when working at [employer]  Previous ear surgeries  []    Family history of hearing loss  [x]  Father with age  Amplification  []    Other  []  Reports headaches    Otoscopy: Right ear: Clear external ear canal and notable landmarks visualized on the tympanic membrane. Left ear:  Clear external ear canal and notable landmarks visualized on the tympanic membrane.  Tympanometry: Right ear: Normal external ear canal volume with normal middle ear pressure and tympanic membrane compliance (Type A). Findings are suggestive of normal middle ear function. Left ear: Normal external ear canal volume with normal middle ear pressure and tympanic membrane compliance (Type A). Findings are suggestive of normal middle ear function.  Hearing Evaluation The hearing test results were completed under headphones and re-checked with inserts and results are deemed to be of good reliability. Test technique:  conventional    Pure tone Audiometry: Right ear- Normal hearing from [phone] Hz, then mild to  profound sensorineural hearing loss from 1500 Hz - 8000 Hz. Left ear-  Normal hearing from [phone] Hz, then mild to profound sensorineural hearing loss from 1500 Hz - 8000 Hz.  Speech Audiometry: Right ear- Speech Reception Threshold (SRT) was obtained at 15 dBHL. Left ear-Speech Reception Threshold (SRT) was obtained at 15 dBHL.   Word Recognition Score Tested using NU-6 (recorded) Right ear: 88% was obtained at a presentation level of 90 dBHL with contralateral masking which is deemed as  good . Left ear: 92% was obtained at a presentation level of 90 dBHL with contralateral masking which is deemed as  excellent.   Impression: There is not a significant difference in pure-tone thresholds between ears. There is not a significant difference in the word recognition score in between ears.    Recommendations: Follow up with ENT as scheduled for today. Return for a hearing evaluation if concerns with hearing changes arise or per MD recommendation. Use hearing protection when exposed to loud/damaging sounds. Consider various tinnitus strategies, including the use of a sound generator, hearing aids, and/or tinnitus retraining therapy. Consider a communication needs assessment after medical  clearance for hearing aids is obtained.   Kashawn Manzano MARIE LEROUX-MARTINEZ, AUD

## 2024-02-06 NOTE — Progress Notes (Signed)
 Patient is having BP managed. Patient has not taken BP medication this morning. Patient also says that his pulse is normally between 46-55 BPM.

## 2024-02-07 ENCOUNTER — Other Ambulatory Visit: Payer: Self-pay

## 2024-02-07 ENCOUNTER — Telehealth: Payer: Self-pay | Admitting: Physician Assistant

## 2024-02-07 DIAGNOSIS — G8929 Other chronic pain: Secondary | ICD-10-CM

## 2024-02-07 NOTE — Telephone Encounter (Signed)
 Pt called stating injection did not work and want to move forward with MRI of right knee. Pt phone number is [phone].

## 2024-02-12 ENCOUNTER — Ambulatory Visit
Admission: RE | Admit: 2024-02-12 | Discharge: 2024-02-12 | Disposition: A | Source: Ambulatory Visit | Attending: Physician Assistant

## 2024-02-12 DIAGNOSIS — G8929 Other chronic pain: Secondary | ICD-10-CM

## 2024-02-14 ENCOUNTER — Ambulatory Visit: Admitting: Physician Assistant

## 2024-02-15 ENCOUNTER — Ambulatory Visit: Admitting: Physician Assistant

## 2024-02-21 ENCOUNTER — Ambulatory Visit: Admitting: Physician Assistant

## 2024-02-22 ENCOUNTER — Encounter: Payer: Self-pay | Admitting: Audiology

## 2024-02-28 ENCOUNTER — Encounter: Payer: Self-pay | Admitting: Physician Assistant

## 2024-02-28 ENCOUNTER — Ambulatory Visit: Admitting: Physician Assistant

## 2024-02-28 DIAGNOSIS — G8929 Other chronic pain: Secondary | ICD-10-CM

## 2024-02-28 DIAGNOSIS — M25561 Pain in right knee: Secondary | ICD-10-CM | POA: Insufficient documentation

## 2024-02-28 NOTE — Progress Notes (Signed)
 Office Visit Note   Patient: Blake Solis           Date of Birth: [birth year]           MRN: [id number] Visit Date: 02/28/2024              Requested by: Trudy Vaughn FALCON, MD [address] [place],  [place] [address] PCP: Trudy Vaughn FALCON, MD  Chief Complaint  Patient presents with   Right Knee - Follow-up      HPI: Patient is a pleasant 68 year old gentleman with a 6 to 65-month history of right knee pain.  He has had previous arthroscopic surgery with Dr. Barbarann many years ago.  He has had a 6 to 56-month history of right knee pain.  He cannot take many anti-inflammatories as he only has 1 kidney.  He has tried Tylenol  on a regular basis.  He has tried topical medications without much help.  I last saw him about 6 weeks ago at which time I gave him an injection.  He said that helped a lot but only lasted for 5 days.  An MRI is ordered.   Assessment & Plan: Visit Diagnoses:  1. Chronic pain of right knee     Plan: I reviewed the MRI with the patient he does have a Bakers cyst.  He does have degenerative changes.  No indication of a new meniscus tear.  I would like for him to try physical therapy.  Before he leaves had like for him to make a follow-up appointment with Dr. OLEGARIO after he has completed physical therapy in about 4 to 6 weeks.u   Follow-Up Instructions: 4-6 weeks  Ortho Exam  Patient is alert, oriented, no adenopathy, well-dressed, normal affect, normal respiratory effort. Examination of his right knee no effusion no erythema more tender today over the medial rather than lateral joint line compartments are soft and compressible no tenderness over the popliteal fossa    Imaging: No results found. No images are attached to the encounter.  Labs: Lab Results  Component Value Date   REPTSTATUS 09/07/2016 FINAL 09/02/2016   CULT NO GROWTH 5 DAYS 09/02/2016     Lab Results  Component Value Date   ALBUMIN 4.0 06/02/2012    No results found for: MG No results  found for: VD25OH  No results found for: PREALBUMIN    Latest Ref Rng & Units 10/20/2022    7:02 AM 09/02/2016    2:10 PM 06/09/2012    7:00 AM  CBC EXTENDED  WBC 4.0 - 10.5 K/uL  6.2  9.5   RBC 4.22 - 5.81 MIL/uL  5.06  3.96   Hemoglobin 13.0 - 17.0 g/dL 85.3  84.6  87.6   HCT 39.0 - 52.0 % 43.0  45.0  34.7   Platelets 150 - 400 K/uL  136  171      There is no height or weight on file to calculate BMI.  Orders:  No orders of the defined types were placed in this encounter.  No orders of the defined types were placed in this encounter.    Procedures: No procedures performed  Clinical Data: No additional findings.  ROS:  All other systems negative, except as noted in the HPI. Review of Systems  Objective: Vital Signs: There were no vitals taken for this visit.  Specialty Comments:  MRI of the lumbar spine from 07/23/2023 was independently reviewed and interpreted, showing right-sided foraminal stenosis at L4/5.  No other significant stenosis seen.  Facet arthropathy at L4/5 and L5/S1.  Small disc herniation with annular tear at L4/5.  DDD at L3/4 and L4/5.  PMFS History: Patient Active Problem List   Diagnosis Date Noted   Pain in right knee 02/28/2024   Pain in left hip 02/23/2023   Symptomatic bradycardia 12/04/2021   Scrotal sebaceous cyst 12/24/2020   Ganglion of left elbow 10/28/2020   Chest pain with high risk for cardiac etiology 04/29/2020   Renal cyst 01/21/2020   Benign prostatic hyperplasia with urinary obstruction 01/21/2020   Nocturia 01/21/2020   S/P arthroscopy of right shoulder 11/01/2017   Superior glenoid labrum lesion of right shoulder    Arthrosis of right acromioclavicular joint    Tendinopathy of rotator cuff, right    Impingement syndrome of right shoulder    Nontraumatic tear of right supraspinatus tendon 09/29/2017   Cubital tunnel syndrome on left 05/13/2017   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   Wears glasses     Family History  Problem Relation Age of Onset   Colon cancer Brother 18   Neurologic Disorder Mother        Lang Brought - by report   Liver disease Father    Other Sister 35       Natural causes   Pancreatic cancer Brother 16       Unknown   Diabetes Mellitus II Brother        Died from DM-Coma @ 75   Diabetic kidney disease Brother 84       End-stage- s/p Txplant (Isais was the Donor)   Lung cancer Brother 17   Other Sister 69   Healthy Sister        He does not know much details   Arthritis Sister    Diabetes type II Sister    Arthritis Sister    Stomach cancer Sister     Past Surgical History:  Procedure Laterality Date   ACHILLES TENDON SURGERY Right 10/20/2022   Procedure: ACHILLES TENDON REPAIR;  Surgeon: Dulcie Gretta POUR, DPM;  Location: [facility];  Service: Podiatry;  Laterality: Right;   CARDIAC CATHETERIZATION  04/04/2002   Images reviewed: Normal coronaries   COLONOSCOPY N/A 09/06/2019   Procedure: COLONOSCOPY;  Surgeon: Golda Claudis PENNER, MD;  [facility];  Service: Endoscopy;  Laterality: N/A;  135   COLONOSCOPY N/A 11/16/2023   Procedure: COLONOSCOPY;  Surgeon: Shaaron Lamar HERO, MD;  [facility];  Service: Endoscopy;  Laterality: N/A;  11:00 am,  asa 2   CYST EXCISION Right    cyst removed from arm   ELBOW SURGERY Left    ESOPHAGOGASTRODUODENOSCOPY N/A 11/16/2023   Procedure: EGD (ESOPHAGOGASTRODUODENOSCOPY);  Surgeon: Shaaron Lamar HERO, MD;  [facility];  Service: Endoscopy;  Laterality: N/A;   HIP ARTHROPLASTY Right    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   OSTECTOMY Right 10/20/2022   Procedure: CALCANEAL PARTIAL EXCISION, Flexor digitorum longus tendon transfer, right;  Surgeon: Dulcie Gretta POUR, DPM;  Location: [facility];  Service: Podiatry;  Laterality: Right;   POLYPECTOMY  09/06/2019   Procedure: POLYPECTOMY;  Surgeon: Golda Claudis PENNER, MD;  [facility];  Service: Endoscopy;;   SCROTAL EXPLORATION N/A 01/01/2021   Procedure: SCROTUM EXPLORATION- excision of scrotal sebacous cyst;  Surgeon: Sherrilee Belvie CROME, MD;  Location: [facility];  Service: Urology;  Laterality: N/A;   SCROTAL EXPLORATION N/A 02/08/2022   Procedure: SCROTUM EXPLORATION- excision of cyst;  Surgeon: Sherrilee Belvie CROME, MD;  Location: [facility];  Service: Urology;  Laterality: N/A;   SHOULDER ARTHROSCOPY WITH ROTATOR CUFF REPAIR AND SUBACROMIAL DECOMPRESSION Right 10/14/2017   Procedure: RIGHT SHOULDER ARTHROSCOPY WITH EXTENSIVE DEBRIDEMENT, SUBACROMIAL DECOMPRESSION, DISTAL CLAVICLE EXCISION AND BICEPS TENODYSIS;  Surgeon: Jerri Kay HERO, MD;  Location: [facility];  Service: Orthopedics;  Laterality: Right;   TOTAL HIP ARTHROPLASTY Right 06/07/2012   Procedure: TOTAL HIP ARTHROPLASTY ANTERIOR APPROACH;  Surgeon: Oneil JAYSON Herald, MD;  [facility];   Service: Orthopedics;  Laterality: Right;  Right Total Hip Arthroplasty-Anterior Approach   TRANSTHORACIC ECHOCARDIOGRAM  04/28/2020   [facility]) EF 65 to 70%.  No or WMA.  GR 1 DD.  Mildly thickened aortic valve.  Mild to moderately dilated left atrium.  Normal RV size and function.  Mild RA dilation.:   ULNAR NERVE TRANSPOSITION Left 11/13/2020   Procedure: left elbow ulnar nerve neurolysis, ganglion cyst removal;  Surgeon: Jerri Kay HERO, MD;  Location: [facility];  Service: Orthopedics;  Laterality: Left;   Social History   Occupational History   Occupation: Actuary: [employer]    Comment: Now called [employer]  Tobacco Use   Smoking status: Former    Current packs/day: 0.00    Average packs/day: 1 pack/day for 40.0 years (40.0 ttl pk-yrs)    Types: Cigarettes    Start date: 04/22/1958    Quit date: 04/22/1998    Years since quitting: 25.8   Smokeless tobacco: Never  Vaping Use   Vaping status: Never Used  Substance and Sexual Activity   Alcohol use: No   Drug use: No   Sexual activity: Yes

## 2024-02-29 ENCOUNTER — Ambulatory Visit: Admitting: Physician Assistant

## 2024-03-06 ENCOUNTER — Ambulatory Visit: Admitting: Orthopaedic Surgery

## 2024-03-13 ENCOUNTER — Ambulatory Visit: Admitting: Orthopaedic Surgery

## 2024-03-19 NOTE — Therapy (Incomplete)
 " OUTPATIENT PHYSICAL THERAPY LOWER EXTREMITY EVALUATION   Patient Name: Blake Solis MRN: [id number] DOB:[birth year], 68 y.o., male Today's Date: 03/19/2024  END OF SESSION:   Past Medical History:  Diagnosis Date   Arthritis    Bronchitis, allergic    Essential hypertension    On several medications.   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   Wears glasses    Past Surgical History:  Procedure Laterality Date   ACHILLES TENDON SURGERY Right 10/20/2022   Procedure: ACHILLES TENDON REPAIR;  Surgeon: Dulcie Gretta POUR, DPM;  Location: [facility];  Service: Podiatry;  Laterality: Right;   CARDIAC CATHETERIZATION  04/04/2002   Images reviewed: Normal coronaries   COLONOSCOPY N/A 09/06/2019   Procedure: COLONOSCOPY;  Surgeon: Golda Claudis PENNER, MD;  [facility];  Service: Endoscopy;  Laterality: N/A;  135   COLONOSCOPY N/A 11/16/2023   Procedure: COLONOSCOPY;  Surgeon: Shaaron Lamar HERO, MD;  [facility];  Service: Endoscopy;  Laterality: N/A;  11:00 am, asa 2   CYST EXCISION Right    cyst removed from arm   ELBOW SURGERY Left    ESOPHAGOGASTRODUODENOSCOPY N/A 11/16/2023   Procedure: EGD (ESOPHAGOGASTRODUODENOSCOPY);  Surgeon: Shaaron Lamar HERO, MD;  [facility];  Service: Endoscopy;  Laterality: N/A;   HIP ARTHROPLASTY Right    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   OSTECTOMY Right 10/20/2022   Procedure: CALCANEAL PARTIAL EXCISION, Flexor digitorum longus tendon transfer, right;  Surgeon: Dulcie Gretta POUR, DPM;  Location: [facility];  Service: Podiatry;  Laterality: Right;   POLYPECTOMY  09/06/2019   Procedure: POLYPECTOMY;  Surgeon: Golda Claudis PENNER, MD;  [facility];  Service: Endoscopy;;   SCROTAL EXPLORATION N/A 01/01/2021   Procedure: SCROTUM EXPLORATION- excision of scrotal sebacous cyst;  Surgeon: Sherrilee Belvie CROME, MD;  [facility];  Service: Urology;  Laterality: N/A;   SCROTAL EXPLORATION N/A 02/08/2022   Procedure: SCROTUM EXPLORATION- excision of cyst;  Surgeon: Sherrilee Belvie CROME, MD;  Location: [facility];  Service: Urology;  Laterality: N/A;   SHOULDER ARTHROSCOPY WITH ROTATOR CUFF REPAIR AND SUBACROMIAL DECOMPRESSION Right 10/14/2017   Procedure: RIGHT SHOULDER ARTHROSCOPY WITH EXTENSIVE DEBRIDEMENT, SUBACROMIAL DECOMPRESSION, DISTAL CLAVICLE EXCISION AND BICEPS TENODYSIS;  Surgeon: Jerri Kay HERO, MD;  Location: [facility];  Service: Orthopedics;  Laterality: Right;   TOTAL HIP ARTHROPLASTY Right 06/07/2012   Procedure: TOTAL HIP ARTHROPLASTY ANTERIOR APPROACH;  Surgeon: Oneil JAYSON Herald, MD;  [facility];  Service: Orthopedics;  Laterality: Right;  Right Total Hip Arthroplasty-Anterior Approach   TRANSTHORACIC ECHOCARDIOGRAM  04/28/2020   [facility]) EF 65 to 70%.  No or WMA.  GR 1 DD.  Mildly thickened aortic valve.  Mild to moderately dilated left atrium.  Normal RV size and function.  Mild RA  dilation.:   ULNAR NERVE TRANSPOSITION Left 11/13/2020   Procedure: left elbow ulnar nerve neurolysis, ganglion cyst removal;  Surgeon: Jerri Kay HERO, MD;  Location: [facility];  Service: Orthopedics;  Laterality: Left;   Patient Active Problem List   Diagnosis Date Noted   Pain in right knee 02/28/2024   Pain in left hip 02/23/2023   Symptomatic bradycardia 12/04/2021    Scrotal sebaceous cyst 12/24/2020   Ganglion of left elbow 10/28/2020   Chest pain with high risk for cardiac etiology 04/29/2020   Renal cyst 01/21/2020   Benign prostatic hyperplasia with urinary obstruction 01/21/2020   Nocturia 01/21/2020   S/P arthroscopy of right shoulder 11/01/2017   Superior glenoid labrum lesion of right shoulder    Arthrosis of right acromioclavicular joint    Tendinopathy of rotator cuff, right    Impingement syndrome of right shoulder    Nontraumatic tear of right supraspinatus tendon 09/29/2017   Cubital tunnel syndrome on left 05/13/2017   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) 06/07/2012    Class: Diagnosis of   Essential hypertension 07/08/2010   Hyperlipidemia due to dietary fat intake 07/08/2010   Prostatitis 07/08/2010   Degenerative disc disease 07/08/2010   Hydrocele of testis 07/08/2010   Obesity (BMI 35.0-39.9 without comorbidity) 09/08/2009    PCP: Trudy Vaughn FALCON, MD  REFERRING PROVIDER: Persons, Ronal Dragon, PA  REFERRING DIAG: [phone] (ICD-10-CM) - Chronic pain of right kneeM25.561,G89.29 (ICD-10-CM) - Chronic pain of right knee  THERAPY DIAG:  No diagnosis found.  Rationale for Evaluation and Treatment: Rehabilitation  ONSET DATE: ***  SUBJECTIVE:   SUBJECTIVE STATEMENT: ***  PERTINENT HISTORY: *** PAIN:  Are you having pain? {OPRCPAIN:27236}  PRECAUTIONS: {Therapy precautions:24002}  RED FLAGS: {PT Red Flags:29287}   WEIGHT BEARING RESTRICTIONS: {Yes ***/No:24003}  FALLS:  Has patient fallen in last 6 months? {fallsyesno:27318}  LIVING ENVIRONMENT: Lives with: {OPRC lives with:25569::lives with their family} Lives in: {Lives in:25570} Stairs: {opstairs:27293} Has following equipment at home: {Assistive devices:23999}  OCCUPATION: ***  PLOF: {PLOF:24004}  PATIENT GOALS: ***  NEXT MD VISIT: ***  OBJECTIVE:  Note: Objective measures were completed at Evaluation unless otherwise  noted.  DIAGNOSTIC FINDINGS: ***  PATIENT SURVEYS:  {rehab surveys:24030}  COGNITION: Overall cognitive status: {cognition:24006}     SENSATION: {sensation:27233}  EDEMA:  {edema:24020}  MUSCLE LENGTH: Hamstrings: Right *** deg; Left *** deg Debby test: Right *** deg; Left *** deg  POSTURE: {posture:25561}  PALPATION: ***  LOWER EXTREMITY ROM:  {AROM/PROM:27142} ROM Right eval Left eval  Hip flexion    Hip extension    Hip abduction    Hip adduction    Hip internal rotation    Hip external rotation    Knee flexion    Knee extension    Ankle dorsiflexion    Ankle plantarflexion    Ankle inversion    Ankle eversion     (Blank rows = not tested)  LOWER EXTREMITY MMT:  MMT Right eval Left eval  Hip flexion    Hip extension    Hip abduction    Hip adduction    Hip internal rotation    Hip external rotation    Knee flexion    Knee extension    Ankle dorsiflexion    Ankle plantarflexion    Ankle inversion    Ankle eversion     (Blank rows = not tested)  LOWER EXTREMITY SPECIAL TESTS:  {LEspecialtests:26242}  FUNCTIONAL TESTS:  {Functional tests:24029}  GAIT: Distance walked: ***  Assistive device utilized: {Assistive devices:23999} Level of assistance: {Levels of assistance:24026} Comments: ***                                                                                                                                TREATMENT DATE: 03/20/24 physical therapy evaluation and HEP instruction     PATIENT EDUCATION:  Education details: Patient educated on exam findings, POC, scope of PT, HEP, and ***. Person educated: Patient Education method: Explanation, Demonstration, and Handouts Education comprehension: verbalized understanding, returned demonstration, verbal cues required, and tactile cues required  HOME EXERCISE PROGRAM: ***  ASSESSMENT:  CLINICAL IMPRESSION: Patient is a *** y.o. *** who was seen today for physical therapy  evaluation and treatment for ***.   OBJECTIVE IMPAIRMENTS: {opptimpairments:25111}.   ACTIVITY LIMITATIONS: {activitylimitations:27494}  PARTICIPATION LIMITATIONS: {participationrestrictions:25113}  PERSONAL FACTORS: {Personal factors:25162} are also affecting patient's functional outcome.   REHAB POTENTIAL: Good  CLINICAL DECISION MAKING: Evolving/moderate complexity  EVALUATION COMPLEXITY: Moderate   GOALS: Goals reviewed with patient? No  SHORT TERM GOALS: Target date: *** *** Baseline: Goal status: INITIAL  2.  *** Baseline:  Goal status: INITIAL  3.  *** Baseline:  Goal status: INITIAL  4.  *** Baseline:  Goal status: INITIAL  5.  *** Baseline:  Goal status: INITIAL  6.  *** Baseline:  Goal status: INITIAL  LONG TERM GOALS: Target date: ***  *** Baseline:  Goal status: INITIAL  2.  *** Baseline:  Goal status: INITIAL  3.  *** Baseline:  Goal status: INITIAL  4.  *** Baseline:  Goal status: INITIAL  5.  *** Baseline:  Goal status: INITIAL  6.  *** Baseline:  Goal status: INITIAL   PLAN:  PT FREQUENCY: {rehab frequency:25116}  PT DURATION: {rehab duration:25117}  PLANNED INTERVENTIONS: 97164- PT Re-evaluation, 97110-Therapeutic exercises, 97530- Therapeutic activity, 97112- Neuromuscular re-education, 97535- Self Care, 02859- Manual therapy, [id number]- Gait training, [id number]- Orthotic Fit/training, [id number]- Canalith repositioning, [id number]- Aquatic Therapy, 97760- Splinting, 97597- Wound care (first 20 sq cm), 97598- Wound care (each additional 20 sq cm)Patient/Family education, Balance training, Stair training, Taping, Dry Needling, Joint mobilization, Joint manipulation, Spinal manipulation, Spinal mobilization, Scar mobilization, and DME instructions.    PLAN FOR NEXT SESSION: Review of HEP and goals   2:45 PM, 03/19/2024 Dervin Vore Small Aleesia Henney MPT [facility] [place] [phone] [phone]  "

## 2024-03-20 ENCOUNTER — Ambulatory Visit (HOSPITAL_COMMUNITY)

## 2024-03-26 ENCOUNTER — Ambulatory Visit (HOSPITAL_COMMUNITY)

## 2024-04-03 ENCOUNTER — Ambulatory Visit: Admitting: Orthopaedic Surgery

## 2024-04-30 ENCOUNTER — Ambulatory Visit: Admitting: Pulmonary Disease

## 2024-04-30 DIAGNOSIS — J449 Chronic obstructive pulmonary disease, unspecified: Secondary | ICD-10-CM

## 2024-04-30 DIAGNOSIS — R911 Solitary pulmonary nodule: Secondary | ICD-10-CM

## 2024-04-30 DIAGNOSIS — J432 Centrilobular emphysema: Secondary | ICD-10-CM
